# Patient Record
Sex: Female | Born: 1935 | Race: White | Hispanic: No | Smoking: Never smoker
Health system: Southern US, Community
[De-identification: ages and names within clinical notes are randomized; demographics above are authoritative.]

## PROBLEM LIST (undated history)

## (undated) DIAGNOSIS — I5181 Takotsubo syndrome: Secondary | ICD-10-CM

## (undated) DIAGNOSIS — N6019 Diffuse cystic mastopathy of unspecified breast: Secondary | ICD-10-CM

## (undated) DIAGNOSIS — E119 Type 2 diabetes mellitus without complications: Secondary | ICD-10-CM

## (undated) DIAGNOSIS — I10 Essential (primary) hypertension: Secondary | ICD-10-CM

## (undated) DIAGNOSIS — R000 Tachycardia, unspecified: Secondary | ICD-10-CM

## (undated) DIAGNOSIS — I442 Atrioventricular block, complete: Secondary | ICD-10-CM

## (undated) DIAGNOSIS — I480 Paroxysmal atrial fibrillation: Secondary | ICD-10-CM

## (undated) DIAGNOSIS — E785 Hyperlipidemia, unspecified: Secondary | ICD-10-CM

## (undated) DIAGNOSIS — I639 Cerebral infarction, unspecified: Secondary | ICD-10-CM

## (undated) DIAGNOSIS — Z7901 Long term (current) use of anticoagulants: Secondary | ICD-10-CM

## (undated) DIAGNOSIS — I214 Non-ST elevation (NSTEMI) myocardial infarction: Secondary | ICD-10-CM

## (undated) DIAGNOSIS — C50912 Malignant neoplasm of unspecified site of left female breast: Secondary | ICD-10-CM

## (undated) DIAGNOSIS — I5032 Chronic diastolic (congestive) heart failure: Secondary | ICD-10-CM

## (undated) DIAGNOSIS — I441 Atrioventricular block, second degree: Secondary | ICD-10-CM

## (undated) DIAGNOSIS — I5189 Other ill-defined heart diseases: Secondary | ICD-10-CM

## (undated) DIAGNOSIS — C50919 Malignant neoplasm of unspecified site of unspecified female breast: Secondary | ICD-10-CM

## (undated) DIAGNOSIS — I2510 Atherosclerotic heart disease of native coronary artery without angina pectoris: Secondary | ICD-10-CM

## (undated) DIAGNOSIS — H3530 Unspecified macular degeneration: Secondary | ICD-10-CM

## (undated) DIAGNOSIS — K635 Polyp of colon: Secondary | ICD-10-CM

## (undated) DIAGNOSIS — I4891 Unspecified atrial fibrillation: Secondary | ICD-10-CM

## (undated) DIAGNOSIS — Z950 Presence of cardiac pacemaker: Secondary | ICD-10-CM

## (undated) DIAGNOSIS — I471 Supraventricular tachycardia, unspecified: Secondary | ICD-10-CM

## (undated) HISTORY — DX: Essential (primary) hypertension: I10

## (undated) HISTORY — DX: Tachycardia, unspecified: R00.0

## (undated) HISTORY — DX: Hyperlipidemia, unspecified: E78.5

## (undated) HISTORY — DX: Polyp of colon: K63.5

## (undated) HISTORY — DX: Unspecified macular degeneration: H35.30

## (undated) HISTORY — PX: OTHER SURGICAL HISTORY: SHX169

## (undated) HISTORY — PX: APPENDECTOMY: SHX54

## (undated) HISTORY — PX: ABDOMINAL HYSTERECTOMY: SHX81

## (undated) HISTORY — DX: Diffuse cystic mastopathy of unspecified breast: N60.19

---

## 2004-09-22 ENCOUNTER — Ambulatory Visit: Payer: Self-pay | Admitting: Internal Medicine

## 2004-10-04 HISTORY — PX: BREAST BIOPSY: SHX20

## 2004-10-21 ENCOUNTER — Ambulatory Visit: Payer: Self-pay | Admitting: Internal Medicine

## 2005-05-24 ENCOUNTER — Ambulatory Visit: Payer: Self-pay | Admitting: General Surgery

## 2005-12-20 ENCOUNTER — Ambulatory Visit: Payer: Self-pay | Admitting: General Surgery

## 2007-01-12 ENCOUNTER — Ambulatory Visit: Payer: Self-pay | Admitting: General Surgery

## 2008-01-15 ENCOUNTER — Ambulatory Visit: Payer: Self-pay | Admitting: General Surgery

## 2008-02-08 ENCOUNTER — Ambulatory Visit: Payer: Self-pay | Admitting: General Surgery

## 2009-01-17 ENCOUNTER — Ambulatory Visit: Payer: Self-pay | Admitting: General Surgery

## 2009-05-08 ENCOUNTER — Ambulatory Visit: Payer: Self-pay | Admitting: Ophthalmology

## 2009-05-14 ENCOUNTER — Ambulatory Visit: Payer: Self-pay | Admitting: Ophthalmology

## 2010-01-26 ENCOUNTER — Ambulatory Visit: Payer: Self-pay | Admitting: General Surgery

## 2011-01-28 ENCOUNTER — Ambulatory Visit: Payer: Self-pay | Admitting: General Surgery

## 2012-03-07 ENCOUNTER — Ambulatory Visit: Payer: Self-pay | Admitting: General Surgery

## 2012-10-04 HISTORY — PX: EYE SURGERY: SHX253

## 2012-10-05 ENCOUNTER — Ambulatory Visit: Payer: Self-pay | Admitting: Ophthalmology

## 2012-10-05 DIAGNOSIS — I10 Essential (primary) hypertension: Secondary | ICD-10-CM

## 2012-10-10 ENCOUNTER — Ambulatory Visit: Payer: Self-pay | Admitting: Ophthalmology

## 2013-03-08 ENCOUNTER — Ambulatory Visit: Payer: Self-pay | Admitting: General Surgery

## 2013-03-13 ENCOUNTER — Encounter: Payer: Self-pay | Admitting: General Surgery

## 2013-03-14 ENCOUNTER — Encounter: Payer: Self-pay | Admitting: General Surgery

## 2013-03-22 ENCOUNTER — Encounter: Payer: Self-pay | Admitting: General Surgery

## 2013-03-22 ENCOUNTER — Ambulatory Visit (INDEPENDENT_AMBULATORY_CARE_PROVIDER_SITE_OTHER): Payer: Medicare Other | Admitting: General Surgery

## 2013-03-22 ENCOUNTER — Other Ambulatory Visit: Payer: Self-pay

## 2013-03-22 VITALS — BP 124/78 | HR 88 | Resp 12 | Ht 66.0 in | Wt 182.6 lb

## 2013-03-22 DIAGNOSIS — N6019 Diffuse cystic mastopathy of unspecified breast: Secondary | ICD-10-CM

## 2013-03-22 DIAGNOSIS — Z860100 Personal history of colon polyps, unspecified: Secondary | ICD-10-CM | POA: Insufficient documentation

## 2013-03-22 DIAGNOSIS — N6452 Nipple discharge: Secondary | ICD-10-CM

## 2013-03-22 DIAGNOSIS — N6459 Other signs and symptoms in breast: Secondary | ICD-10-CM

## 2013-03-22 HISTORY — DX: Nipple discharge: N64.52

## 2013-03-22 MED ORDER — POLYETHYLENE GLYCOL 3350 17 GM/SCOOP PO POWD: ORAL | Status: DC

## 2013-03-22 NOTE — Progress Notes (Addendum)
Patient ID: Susan [name], female   DOB: 11/15/1931, 79 y.o.   MRN: [id number]  Chief Complaint  Patient presents with  . Follow-up    mammogram    HPI [name] is a 79 y.o. female who presents for a breast evaluation. She has a history of FCD.The most recent mammogram was done on 02/22/13 and 03/08/13 with a birad category 0. Additional views of the left breast were done on 03/05/13 with a birad category 2. Patient does perform regular self breast checks and gets regular mammograms done. The patient has had a right breast biopsy done in 2006. No family history of breast cancer. The patient states she has had 1 episode of bloody discharge in the left breast 1 mo ago-none since. She also states pain in the left breast that comes and goes. She describes the pain as mild and short. Patient is due for colonoscopy -last in 2009 with polyp removal.    HPI  Past Medical History  Diagnosis Date  . Hypertension   . Diffuse cystic mastopathy   . Colon polyp     Past Surgical History  Procedure Laterality Date  . Colonoscopy    . Breast biopsy Right 2006  . Salpingo oophorectmy    . Appendectomy    . Abdominal hysterectomy    . Eye surgery Bilateral 2014    cataract    History reviewed. No pertinent family history.  Social History History  Substance Use Topics  . Smoking status: Never Smoker   . Smokeless tobacco: Never Used  . Alcohol Use: No    No Known Allergies  Current Outpatient Prescriptions  Medication Sig Dispense Refill  . amLODipine-benazepril (LOTREL) 5-20 MG per capsule Take 1 capsule by mouth daily.      Marland Kitchen atorvastatin (LIPITOR) 20 MG tablet Take 20 mg by mouth daily.      . Biotin 5000 MCG CAPS Take 1 capsule by mouth daily.      . hydrochlorothiazide (HYDRODIURIL) 25 MG tablet Take 25 mg by mouth daily.      . Multiple Vitamins-Minerals (PRESERVISION AREDS 2) CAPS Take 1 capsule by mouth daily.      . polyethylene glycol powder (GLYCOLAX/MIRALAX) powder  255 grams one bottle for colonoscopy prep  255 g  0   No current facility-administered medications for this visit.    Review of Systems Review of Systems  Constitutional: Negative.   Respiratory: Negative.   Cardiovascular: Negative.     Blood pressure 124/78, pulse 88, resp. rate 12, height 5\' 6"  (1.676 m), weight 182 lb 9.6 oz (82.827 kg).  Physical Exam Physical Exam  Constitutional: She is oriented to person, place, and time. She appears well-developed and well-nourished.  Eyes: Conjunctivae are normal. No scleral icterus.  Neck: Trachea normal. No mass and no thyromegaly present.  Cardiovascular: Normal rate, regular rhythm and normal heart sounds.   Pulmonary/Chest: Effort normal and breath sounds normal. Right breast exhibits no inverted nipple, no mass, no nipple discharge, no skin change and no tenderness. Left breast exhibits no inverted nipple, no mass, no nipple discharge, no skin change and no tenderness. Breasts are symmetrical.  Abdominal: Soft. Normal appearance and bowel sounds are normal. There is no hepatosplenomegaly. There is no tenderness. No hernia.  Lymphadenopathy:    She has no cervical adenopathy.    She has no axillary adenopathy.  Neurological: She is alert and oriented to person, place, and time.  Skin: Skin is warm and dry.    Data  Reviewed  Mammogram reviewed and stable.   Assessment   Ultrasound of left breast to be performed today due to left nipple discharge. Completed today with no findings.     Plan    Patient to have colonoscopy scheduled. Follow up in 1 year with bilateral screening mammogram.     Patient has been scheduled for a colonoscopy on 04-04-13 at [facility].   Mame Twombly G 03/22/2013, 7:25 PM

## 2013-03-22 NOTE — Patient Instructions (Addendum)
The patient will be scheduled for a colonoscopy. Patient to follow up in 1 year with bilateral screening mammogram.   Colonoscopy A colonoscopy is an exam to evaluate your entire colon. In this exam, your colon is cleansed. A long fiberoptic tube is inserted through your rectum and into your colon. The fiberoptic scope (endoscope) is a long bundle of enclosed and very flexible fibers. These fibers transmit light to the area examined and send images from that area to your caregiver. Discomfort is usually minimal. You may be given a drug to help you sleep (sedative) during or prior to the procedure. This exam helps to detect lumps (tumors), polyps, inflammation, and areas of bleeding. Your caregiver may also take a small piece of tissue (biopsy) that will be examined under a microscope. LET YOUR CAREGIVER KNOW ABOUT:   Allergies to food or medicine.  Medicines taken, including vitamins, herbs, eyedrops, over-the-counter medicines, and creams.  Use of steroids (by mouth or creams).  Previous problems with anesthetics or numbing medicines.  History of bleeding problems or blood clots.  Previous surgery.  Other health problems, including diabetes and kidney problems.  Possibility of pregnancy, if this applies. BEFORE THE PROCEDURE   A clear liquid diet may be required for 2 days before the exam.  Ask your caregiver about changing or stopping your regular medications.  Liquid injections (enemas) or laxatives may be required.  A large amount of electrolyte solution may be given to you to drink over a short period of time. This solution is used to clean out your colon.  You should be present 60 minutes prior to your procedure or as directed by your caregiver. AFTER THE PROCEDURE   If you received a sedative or pain relieving medication, you will need to arrange for someone to drive you home.  Occasionally, there is a little blood passed with the first bowel movement. Do not be  concerned. FINDING OUT THE RESULTS OF YOUR TEST Not all test results are available during your visit. If your test results are not back during the visit, make an appointment with your caregiver to find out the results. Do not assume everything is normal if you have not heard from your caregiver or the medical facility. It is important for you to follow up on all of your test results. HOME CARE INSTRUCTIONS   It is not unusual to pass moderate amounts of gas and experience mild abdominal cramping following the procedure. This is due to air being used to inflate your colon during the exam. Walking or a warm pack on your belly (abdomen) may help.  You may resume all normal meals and activities after sedatives and medicines have worn off.  Only take over-the-counter or prescription medicines for pain, discomfort, or fever as directed by your caregiver. Do not use aspirin or blood thinners if a biopsy was taken. Consult your caregiver for medicine usage if biopsies were taken. SEEK IMMEDIATE MEDICAL CARE IF:   You have a fever.  You pass large blood clots or fill a toilet with blood following the procedure. This may also occur 10 to 14 days following the procedure. This is more likely if a biopsy was taken.  You develop abdominal pain that keeps getting worse and cannot be relieved with medicine. Document Released: 09/17/2000 Document Revised: 12/13/2011 Document Reviewed: 05/02/2008 [facility] Patient Information 2014 [place], [place].  Patient has been scheduled for a colonoscopy on 04-04-13 at [facility].  Advised if she notes any more bloody discharge from nipple to  call for reevalaution

## 2013-03-26 ENCOUNTER — Other Ambulatory Visit: Payer: Self-pay | Admitting: General Surgery

## 2013-03-26 ENCOUNTER — Telehealth: Payer: Self-pay | Admitting: *Deleted

## 2013-03-26 DIAGNOSIS — Z86010 Personal history of colonic polyps: Secondary | ICD-10-CM

## 2013-03-26 NOTE — Telephone Encounter (Signed)
Patient contacted the office wanting to reschedule colonoscopy. This has been moved from 04-04-13 to 05-02-13 at [facility]. Trish in endoscopy aware of date change. Patient to call if she has further questions.

## 2013-04-09 ENCOUNTER — Ambulatory Visit: Payer: Self-pay | Admitting: General Surgery

## 2013-05-02 ENCOUNTER — Ambulatory Visit: Payer: Self-pay | Admitting: General Surgery

## 2013-05-02 DIAGNOSIS — Z86010 Personal history of colonic polyps: Secondary | ICD-10-CM

## 2013-05-02 DIAGNOSIS — K5730 Diverticulosis of large intestine without perforation or abscess without bleeding: Secondary | ICD-10-CM

## 2013-05-04 ENCOUNTER — Encounter: Payer: Self-pay | Admitting: General Surgery

## 2014-03-10 ENCOUNTER — Observation Stay: Payer: Self-pay | Admitting: Internal Medicine

## 2014-03-10 DIAGNOSIS — I517 Cardiomegaly: Secondary | ICD-10-CM

## 2014-03-10 DIAGNOSIS — I472 Ventricular tachycardia, unspecified: Secondary | ICD-10-CM | POA: Insufficient documentation

## 2014-03-10 LAB — URINALYSIS, COMPLETE
BILIRUBIN, UR: NEGATIVE
BLOOD: NEGATIVE
GLUCOSE, UR: NEGATIVE mg/dL (ref 0–75)
KETONE: NEGATIVE
Leukocyte Esterase: NEGATIVE
NITRITE: NEGATIVE
PROTEIN: NEGATIVE
Ph: 6 (ref 4.5–8.0)
RBC,UR: 1 /HPF (ref 0–5)
Specific Gravity: 1.006 (ref 1.003–1.030)

## 2014-03-10 LAB — CBC
HCT: 44.8 % (ref 35.0–47.0)
HGB: 14.6 g/dL (ref 12.0–16.0)
MCH: 30.5 pg (ref 26.0–34.0)
MCHC: 32.7 g/dL (ref 32.0–36.0)
MCV: 93 fL (ref 80–100)
PLATELETS: 251 10*3/uL (ref 150–440)
RBC: 4.8 10*6/uL (ref 3.80–5.20)
RDW: 13 % (ref 11.5–14.5)
WBC: 8.1 10*3/uL (ref 3.6–11.0)

## 2014-03-10 LAB — COMPREHENSIVE METABOLIC PANEL
AST: 23 U/L (ref 15–37)
Albumin: 4 g/dL (ref 3.4–5.0)
Alkaline Phosphatase: 96 U/L
Anion Gap: 7 (ref 7–16)
BUN: 13 mg/dL (ref 7–18)
Bilirubin,Total: 0.4 mg/dL (ref 0.2–1.0)
CHLORIDE: 100 mmol/L (ref 98–107)
Calcium, Total: 9 mg/dL (ref 8.5–10.1)
Co2: 27 mmol/L (ref 21–32)
Creatinine: 0.61 mg/dL (ref 0.60–1.30)
EGFR (African American): >60
EGFR (Non-African Amer.): >60
Glucose: 142 mg/dL — ABNORMAL HIGH (ref 65–99)
Osmolality: 271 (ref 275–301)
POTASSIUM: 3.3 mmol/L — AB (ref 3.5–5.1)
SGPT (ALT): 35 U/L (ref 12–78)
SODIUM: 134 mmol/L — AB (ref 136–145)
TOTAL PROTEIN: 7.3 g/dL (ref 6.4–8.2)

## 2014-03-10 LAB — MAGNESIUM: Magnesium: 1.7 mg/dL — ABNORMAL LOW

## 2014-03-10 LAB — CK TOTAL AND CKMB (NOT AT ARMC)
CK, TOTAL: 116 U/L
CK, Total: 97 U/L
CK, Total: 91 U/L
CK-MB: 1 ng/mL (ref 0.5–3.6)
CK-MB: 1.2 ng/mL (ref 0.5–3.6)
CK-MB: 1.1 ng/mL (ref 0.5–3.6)

## 2014-03-10 LAB — HEMOGLOBIN A1C: Hemoglobin A1C: 6.9 % — ABNORMAL HIGH (ref 4.2–6.3)

## 2014-03-10 LAB — TROPONIN I
Troponin-I: <0.02 ng/mL
Troponin-I: <0.02 ng/mL
Troponin-I: <0.02 ng/mL

## 2014-03-10 LAB — TSH: Thyroid Stimulating Horm: 3.16 u[IU]/mL

## 2014-03-11 DIAGNOSIS — I10 Essential (primary) hypertension: Secondary | ICD-10-CM

## 2014-03-11 DIAGNOSIS — R000 Tachycardia, unspecified: Secondary | ICD-10-CM

## 2014-03-11 DIAGNOSIS — E785 Hyperlipidemia, unspecified: Secondary | ICD-10-CM

## 2014-03-11 LAB — BASIC METABOLIC PANEL
ANION GAP: 7 (ref 7–16)
BUN: 9 mg/dL (ref 7–18)
CALCIUM: 8.4 mg/dL — AB (ref 8.5–10.1)
CO2: 22 mmol/L (ref 21–32)
CREATININE: 0.52 mg/dL — AB (ref 0.60–1.30)
Chloride: 108 mmol/L — ABNORMAL HIGH (ref 98–107)
EGFR (African American): >60
GLUCOSE: 138 mg/dL — AB (ref 65–99)
Osmolality: 275 (ref 275–301)
POTASSIUM: 4.1 mmol/L (ref 3.5–5.1)
Sodium: 137 mmol/L (ref 136–145)

## 2014-03-11 LAB — CBC WITH DIFFERENTIAL/PLATELET
Basophil #: 0.1 10*3/uL (ref 0.0–0.1)
Basophil %: 0.7 %
Eosinophil #: 0.2 10*3/uL (ref 0.0–0.7)
Eosinophil %: 2.2 %
HCT: 41.1 % (ref 35.0–47.0)
HGB: 13.5 g/dL (ref 12.0–16.0)
Lymphocyte #: 3.8 10*3/uL — ABNORMAL HIGH (ref 1.0–3.6)
Lymphocyte %: 48.9 %
MCH: 30.7 pg (ref 26.0–34.0)
MCHC: 32.8 g/dL (ref 32.0–36.0)
MCV: 93 fL (ref 80–100)
Monocyte #: 0.6 x10 3/mm (ref 0.2–0.9)
Monocyte %: 8 %
Neutrophil #: 3.1 10*3/uL (ref 1.4–6.5)
Neutrophil %: 40.2 %
Platelet: 238 10*3/uL (ref 150–440)
RBC: 4.4 10*6/uL (ref 3.80–5.20)
RDW: 13 % (ref 11.5–14.5)
WBC: 7.7 10*3/uL (ref 3.6–11.0)

## 2014-03-11 LAB — LIPID PANEL
Cholesterol: 163 mg/dL (ref 0–200)
HDL: 44 mg/dL (ref 40–60)
Ldl Cholesterol, Calc: 52 mg/dL (ref 0–100)
TRIGLYCERIDES: 333 mg/dL — AB (ref 0–200)
VLDL Cholesterol, Calc: 67 mg/dL — ABNORMAL HIGH (ref 5–40)

## 2014-03-11 LAB — MAGNESIUM: Magnesium: 2 mg/dL

## 2014-03-14 ENCOUNTER — Encounter: Payer: Self-pay | Admitting: *Deleted

## 2014-03-15 ENCOUNTER — Ambulatory Visit (INDEPENDENT_AMBULATORY_CARE_PROVIDER_SITE_OTHER): Payer: Medicare Other | Admitting: Cardiovascular Disease

## 2014-03-15 ENCOUNTER — Encounter: Payer: Self-pay | Admitting: Cardiovascular Disease

## 2014-03-15 VITALS — BP 148/80 | HR 63 | Ht 66.0 in | Wt 183.5 lb

## 2014-03-15 DIAGNOSIS — I10 Essential (primary) hypertension: Secondary | ICD-10-CM

## 2014-03-15 DIAGNOSIS — I4891 Unspecified atrial fibrillation: Secondary | ICD-10-CM

## 2014-03-15 DIAGNOSIS — R000 Tachycardia, unspecified: Secondary | ICD-10-CM

## 2014-03-15 MED ORDER — METOPROLOL TARTRATE 25 MG PO TABS: 25.0000 mg | ORAL_TABLET | Freq: Two times a day (BID) | ORAL | Status: DC

## 2014-03-15 NOTE — Assessment & Plan Note (Signed)
Blood pressure is well controlled on current medications. 

## 2014-03-15 NOTE — Patient Instructions (Signed)
Your physician has recommended that you wear an event monitor. Event monitors are medical devices that record the heart's electrical activity. Doctors most often [place] these monitors to diagnose arrhythmias. Arrhythmias are problems with the speed or rhythm of the heartbeat. The monitor is a small, portable device. You can wear one while you do your normal daily activities. This is usually used to diagnose what is causing palpitations/syncope (passing out).  Your physician has recommended you make the following change in your medication:  Decrease Metoprolol to 25 mg twice daily    Your physician recommends that you schedule a follow-up appointment in:  2 months

## 2014-03-15 NOTE — Assessment & Plan Note (Signed)
The patient had a recent episode of tachycardia with a heart rate of 170 beats per minute. The differential diagnosis include SVT versus atrial fibrillation. Symptoms improved with metoprolol. However, she feels very tired and fatigued on the current dose of metoprolol. I decreased the dose to 25 mg twice daily. She continues to have intermittent palpitations. It is very important to exclude atrial fibrillation as that would require anticoagulation. Thus, I recommend a 30 day outpatient telemetry.

## 2014-03-15 NOTE — Progress Notes (Signed)
Primary care physician: Dr. Benita Stabile  HPI  This is an 80-year-old female who is here today for a followup visit after recent hospitalization at [facility]. She has no previous cardiac history. She has known history of hypertension. She presented on June 7 with palpitations, dizziness and presyncope. She was noted by EMS to be tachycardic with a heart rate of 170 beats per minute with underlying right bundle branch block. Rhythm strips were not recorded. It could not be determined if she had SVT or atrial fibrillation as she converted quickly to normal sinus rhythm. Echocardiogram showed normal LV systolic function with no significant valvular abnormalities. Since hospital discharge, she has been feeling tired and fatigue. She was discharged home on metoprolol 50 mg twice daily. She feels dizzy today. She denies any chest discomfort. She continues to have intermittent palpitations but not on a daily basis. Labs were overall unremarkable while hospitalized.  Allergies  Allergen Reactions  . No Known Allergies      Current Outpatient Prescriptions on File Prior to Visit  Medication Sig Dispense Refill  . amLODipine-benazepril (LOTREL) 5-20 MG per capsule Take 1 capsule by mouth daily.      Marland Kitchen aspirin 325 MG EC tablet Take 325 mg by mouth daily.      Marland Kitchen atorvastatin (LIPITOR) 20 MG tablet Take 20 mg by mouth daily.      . Biotin 5000 MCG CAPS Take 1 capsule by mouth daily.      . cycloSPORINE (RESTASIS) 0.05 % ophthalmic emulsion 1 drop daily.      . Multiple Vitamins-Minerals (PRESERVISION AREDS 2) CAPS Take 1 capsule by mouth daily.      . polyethylene glycol powder (GLYCOLAX/MIRALAX) powder 255 grams one bottle for colonoscopy prep  255 g  0   No current facility-administered medications on file prior to visit.     Past Medical History  Diagnosis Date  . Hypertension   . Diffuse cystic mastopathy   . Colon polyp   . Hyperlipidemia   . Macular degeneration   . Tachycardia      Past  Surgical History  Procedure Laterality Date  . Colonoscopy    . Breast biopsy Right 2006  . Salpingo oophorectmy    . Appendectomy    . Abdominal hysterectomy    . Eye surgery Bilateral 2014    cataract     Family History  Problem Relation Age of Onset  . Family history unknown: Yes     History   Social History  . Marital Status: Single    Spouse Name: N/A    Number of Children: N/A  . Years of Education: N/A   Occupational History  . Not on file.   Social History Main Topics  . Smoking status: Never Smoker   . Smokeless tobacco: Never Used  . Alcohol Use: No  . Drug Use: No  . Sexual Activity: Not on file   Other Topics Concern  . Not on file   Social History Narrative  . No narrative on file     ROS A 10 point review of system was performed. It is negative other than that mentioned in the history of present illness.   PHYSICAL EXAM   BP 148/80  Pulse 63  Ht 5\' 6"  (1.676 m)  Wt 183 lb 8 oz (83.235 kg)  BMI 29.63 kg/m2 Constitutional: She is oriented to person, place, and time. She appears well-developed and well-nourished. No distress.  HENT: No nasal discharge.  Head: Normocephalic and atraumatic.  Eyes: Pupils are equal and round. No discharge.  Neck: Normal range of motion. Neck supple. No JVD present. No thyromegaly present.  Cardiovascular: Normal rate, regular rhythm, normal heart sounds. Exam reveals no gallop and no friction rub. No murmur heard.  Pulmonary/Chest: Effort normal and breath sounds normal. No stridor. No respiratory distress. She has no wheezes. She has no rales. She exhibits no tenderness.  Abdominal: Soft. Bowel sounds are normal. She exhibits no distension. There is no tenderness. There is no rebound and no guarding.  Musculoskeletal: Normal range of motion. She exhibits no edema and no tenderness.  Neurological: She is alert and oriented to person, place, and time. Coordination normal.  Skin: Skin is warm and dry. No rash  noted. She is not diaphoretic. No erythema. No pallor.  Psychiatric: She has a normal mood and affect. Her behavior is normal. Judgment and thought content normal.     QMG:QQPYP  Rhythm  -Right bundle branch block.   ABNORMAL    ASSESSMENT AND PLAN

## 2014-03-21 DIAGNOSIS — R000 Tachycardia, unspecified: Secondary | ICD-10-CM

## 2014-03-27 ENCOUNTER — Telehealth: Payer: Self-pay | Admitting: *Deleted

## 2014-03-27 NOTE — Telephone Encounter (Signed)
Please call patient as she isn't sure how long to wear her heart monitor.

## 2014-03-27 NOTE — Telephone Encounter (Signed)
Spoke w/ pt.  Advised her that she is to wear her event monitor for 30 days.  She states that it is quite confining, but she will continue to wear it as long as she can stand it.  Pt to call if she feels she cannot wear for the complete 30 days.

## 2014-04-03 ENCOUNTER — Ambulatory Visit: Payer: Medicare Other | Admitting: General Surgery

## 2014-04-16 ENCOUNTER — Telehealth: Payer: Self-pay | Admitting: *Deleted

## 2014-04-16 NOTE — Telephone Encounter (Signed)
Patient talked to [facility] and sent monitor back today because she was having issues with it  She stated her 30 days were almost complete  Patient stated to call her back at [phone] if we have any questions

## 2014-04-16 NOTE — Telephone Encounter (Signed)
Patient was having problem with the heart monitor. Please call

## 2014-04-23 ENCOUNTER — Ambulatory Visit: Payer: Self-pay | Admitting: General Surgery

## 2014-04-24 ENCOUNTER — Encounter: Payer: Self-pay | Admitting: General Surgery

## 2014-05-01 ENCOUNTER — Ambulatory Visit (INDEPENDENT_AMBULATORY_CARE_PROVIDER_SITE_OTHER): Payer: Medicare Other | Admitting: General Surgery

## 2014-05-01 ENCOUNTER — Encounter: Payer: Self-pay | Admitting: General Surgery

## 2014-05-01 VITALS — BP 110/70 | HR 68 | Resp 12 | Ht 66.0 in | Wt 185.0 lb

## 2014-05-01 DIAGNOSIS — N6019 Diffuse cystic mastopathy of unspecified breast: Secondary | ICD-10-CM

## 2014-05-01 DIAGNOSIS — Z86010 Personal history of colonic polyps: Secondary | ICD-10-CM

## 2014-05-01 NOTE — Progress Notes (Signed)
Patient ID: Susan [name], female   DOB: 12/14/1931, 80 y.o.   MRN: [id number]  Chief Complaint  Patient presents with  . Follow-up    mammogram    HPI Susan [name] is a 80 y.o. female who presents for a breast evaluation. The most recent mammogram was done on 04/23/14.Patient does perform regular self breast checks and gets regular mammograms done.  Recently had an episode of palpitations with HR of 170, responded quickly to Metoprolol. Had a monitor for 30 days, result pending.  HPI  Past Medical History  Diagnosis Date  . Diffuse cystic mastopathy   . Colon polyp   . Hyperlipidemia   . Macular degeneration   . Tachycardia   . Hypertension     Past Surgical History  Procedure Laterality Date  . Colonoscopy    . Breast biopsy Right 2006  . Salpingo oophorectmy    . Appendectomy    . Abdominal hysterectomy    . Eye surgery Bilateral 2014    cataract    History reviewed. No pertinent family history.  Social History History  Substance Use Topics  . Smoking status: Never Smoker   . Smokeless tobacco: Never Used  . Alcohol Use: No    Allergies  Allergen Reactions  . No Known Allergies     Current Outpatient Prescriptions  Medication Sig Dispense Refill  . amLODipine-benazepril (LOTREL) 5-20 MG per capsule Take 1 capsule by mouth daily.      Marland Kitchen aspirin 325 MG EC tablet Take 325 mg by mouth daily.      Marland Kitchen atorvastatin (LIPITOR) 20 MG tablet Take 20 mg by mouth daily.      . Biotin 5000 MCG CAPS Take 1 capsule by mouth daily.      . cycloSPORINE (RESTASIS) 0.05 % ophthalmic emulsion 1 drop daily.      . metoprolol tartrate (LOPRESSOR) 25 MG tablet Take 1 tablet (25 mg total) by mouth 2 (two) times daily.  180 tablet  3  . Multiple Vitamins-Minerals (PRESERVISION AREDS 2) CAPS Take 1 capsule by mouth daily.       No current facility-administered medications for this visit.    Review of Systems Review of Systems  Constitutional: Negative.   Respiratory:  Negative.   Cardiovascular: Negative.     Blood pressure 110/70, pulse 68, resp. rate 12, height 5\' 6"  (1.676 m), weight 185 lb (83.915 kg).  Physical Exam Physical Exam  Constitutional: She is oriented to person, place, and time. She appears well-developed and well-nourished.  Eyes: Conjunctivae are normal. No scleral icterus.  Neck: Neck supple.  Cardiovascular: Normal rate, regular rhythm and normal heart sounds.   Pulses:      Dorsalis pedis pulses are 2+ on the right side, and 2+ on the left side.       Posterior tibial pulses are 2+ on the right side, and 2+ on the left side.  Left ankle edema   Pulmonary/Chest: Effort normal and breath sounds normal. Right breast exhibits no inverted nipple, no mass, no nipple discharge, no skin change and no tenderness. Left breast exhibits no inverted nipple, no mass, no nipple discharge, no skin change and no tenderness.  Abdominal: Soft. Bowel sounds are normal. There is no tenderness.  Lymphadenopathy:    She has no cervical adenopathy.    She has no axillary adenopathy.  Neurological: She is alert and oriented to person, place, and time.  Skin: Skin is warm and dry.    Data Reviewed Mammogram reviewed  Assessment    Stable exam, history of fibrocyst diease. Heart tachycardia, being followed by cardiology. Mild left ankle edema may be due to mild stasis. Colonoscopy last yr -no polyps.    Plan    Patient will be asked to return to the office in one year with a bilateral screening mammogram. Recommend use of mild compression hose for left leg. If edema worsens will reevaluate.       Jt Brabec G 05/01/2014, 7:24 PM

## 2014-05-01 NOTE — Patient Instructions (Signed)
Patient will be asked to return to the office in one year for a bilateral screening mammogram.Continue self breast exams. Call office for any new breast issues or concerns.

## 2014-05-02 ENCOUNTER — Telehealth: Payer: Self-pay | Admitting: *Deleted

## 2014-05-02 NOTE — Telephone Encounter (Signed)
Informed patient that per Dr Fletcher Anon her holter monitor showed: Normal sinus rhythm with no significant arrythmia  Patient verbalized understanding

## 2014-05-03 ENCOUNTER — Ambulatory Visit (INDEPENDENT_AMBULATORY_CARE_PROVIDER_SITE_OTHER): Payer: Medicare Other

## 2014-05-03 ENCOUNTER — Other Ambulatory Visit: Payer: Self-pay

## 2014-05-03 DIAGNOSIS — R000 Tachycardia, unspecified: Secondary | ICD-10-CM

## 2014-05-09 ENCOUNTER — Encounter: Payer: Self-pay | Admitting: Cardiovascular Disease

## 2014-05-09 ENCOUNTER — Ambulatory Visit (INDEPENDENT_AMBULATORY_CARE_PROVIDER_SITE_OTHER): Payer: Medicare Other | Admitting: Cardiovascular Disease

## 2014-05-09 VITALS — BP 104/53 | HR 78 | Ht 66.0 in | Wt 186.0 lb

## 2014-05-09 DIAGNOSIS — I10 Essential (primary) hypertension: Secondary | ICD-10-CM

## 2014-05-09 DIAGNOSIS — R000 Tachycardia, unspecified: Secondary | ICD-10-CM

## 2014-05-09 MED ORDER — LOSARTAN POTASSIUM-HCTZ 50-12.5 MG PO TABS
1.0000 | ORAL_TABLET | Freq: Every day | ORAL | Status: DC
Start: 2014-05-09 — End: 2015-04-25

## 2014-05-09 NOTE — Assessment & Plan Note (Signed)
Likely paroxysmal supraventricular tachycardia. She has not had any episodes since she was placed on metoprolol. Continue current management.

## 2014-05-09 NOTE — Assessment & Plan Note (Signed)
Blood pressure is well controlled. However, she is complaining of increased lower extremity edema. Thus, I switched amlodipine-benazepril to losartan/hydrochlorothiazide.

## 2014-05-09 NOTE — Patient Instructions (Addendum)
Your physician has recommended you make the following change in your medication:  Stop Amlodipine- Benazepril Start Losartan - HCTZ 50/12.5 once daily   Your physician wants you to follow-up in: 6 months. You will receive a reminder letter in the mail two months in advance. If you don't receive a letter, please call our office to schedule the follow-up appointment.

## 2014-05-09 NOTE — Progress Notes (Signed)
Primary care physician: Dr. Benita Stabile  HPI  This is an 80 year old female who is here today for a followup visit regarding suspected supraventricular tachycardia.She has known history of hypertension. She presented on June 7 with palpitations, dizziness and presyncope. She was noted by EMS to be tachycardic with a heart rate of 170 beats per minute with underlying right bundle branch block. Rhythm strips were not recorded. It could not be determined if she had SVT or atrial fibrillation as she converted quickly to normal sinus rhythm. Echocardiogram showed normal LV systolic function with no significant valvular abnormalities. Since hospital discharge, she has been feeling tired and fatigue. She was discharged home on metoprolol 50 mg twice daily which was subsequently decreased to 25 mg once daily. She underwent a 30 day outpatient telemetry which showed no evidence of arrhythmia. Specifically, there was no evidence of atrial fibrillation. She has been doing well. She complains of increased lower extremity edema. She used to be on a diuretic in the past.   Allergies  Allergen Reactions  . No Known Allergies      Current Outpatient Prescriptions on File Prior to Visit  Medication Sig Dispense Refill  . amLODipine-benazepril (LOTREL) 5-20 MG per capsule Take 1 capsule by mouth daily.      Marland Kitchen atorvastatin (LIPITOR) 20 MG tablet Take 20 mg by mouth daily.      . Biotin 5000 MCG CAPS Take 1 capsule by mouth daily.      . cycloSPORINE (RESTASIS) 0.05 % ophthalmic emulsion 1 drop daily.      . metoprolol tartrate (LOPRESSOR) 25 MG tablet Take 1 tablet (25 mg total) by mouth 2 (two) times daily.  180 tablet  3  . Multiple Vitamins-Minerals (PRESERVISION AREDS 2) CAPS Take 1 capsule by mouth daily.       No current facility-administered medications on file prior to visit.     Past Medical History  Diagnosis Date  . Diffuse cystic mastopathy   . Colon polyp   . Hyperlipidemia   . Macular  degeneration   . Tachycardia   . Hypertension      Past Surgical History  Procedure Laterality Date  . Colonoscopy    . Breast biopsy Right 2006  . Salpingo oophorectmy    . Appendectomy    . Abdominal hysterectomy    . Eye surgery Bilateral 2014    cataract     History reviewed. No pertinent family history.   History   Social History  . Marital Status: Divorced    Spouse Name: N/A    Number of Children: N/A  . Years of Education: N/A   Occupational History  . Not on file.   Social History Main Topics  . Smoking status: Never Smoker   . Smokeless tobacco: Never Used  . Alcohol Use: No  . Drug Use: No  . Sexual Activity: Not on file   Other Topics Concern  . Not on file   Social History Narrative  . No narrative on file     ROS A 10 point review of system was performed. It is negative other than that mentioned in the history of present illness.   PHYSICAL EXAM   BP 104/53  Pulse 78  Ht 5\' 6"  (1.676 m)  Wt 186 lb (84.369 kg)  BMI 30.04 kg/m2 Constitutional: She is oriented to person, place, and time. She appears well-developed and well-nourished. No distress.  HENT: No nasal discharge.  Head: Normocephalic and atraumatic.  Eyes: Pupils are equal  and round. No discharge.  Neck: Normal range of motion. Neck supple. No JVD present. No thyromegaly present.  Cardiovascular: Normal rate, regular rhythm, normal heart sounds. Exam reveals no gallop and no friction rub. No murmur heard.  Pulmonary/Chest: Effort normal and breath sounds normal. No stridor. No respiratory distress. She has no wheezes. She has no rales. She exhibits no tenderness.  Abdominal: Soft. Bowel sounds are normal. She exhibits no distension. There is no tenderness. There is no rebound and no guarding.  Musculoskeletal: Normal range of motion. She exhibits no edema and no tenderness.  Neurological: She is alert and oriented to person, place, and time. Coordination normal.  Skin: Skin is  warm and dry. No rash noted. She is not diaphoretic. No erythema. No pallor.  Psychiatric: She has a normal mood and affect. Her behavior is normal. Judgment and thought content normal.       ASSESSMENT AND PLAN

## 2014-05-16 ENCOUNTER — Ambulatory Visit: Payer: Medicare Other | Admitting: Cardiovascular Disease

## 2014-08-05 ENCOUNTER — Encounter: Payer: Self-pay | Admitting: Cardiovascular Disease

## 2014-11-05 ENCOUNTER — Ambulatory Visit: Payer: Medicare Other | Admitting: Cardiovascular Disease

## 2014-11-07 ENCOUNTER — Encounter: Payer: Self-pay | Admitting: Cardiovascular Disease

## 2014-11-07 ENCOUNTER — Ambulatory Visit (INDEPENDENT_AMBULATORY_CARE_PROVIDER_SITE_OTHER): Payer: Medicare Other | Admitting: Cardiovascular Disease

## 2014-11-07 VITALS — BP 134/84 | HR 72 | Ht 66.0 in | Wt 188.5 lb

## 2014-11-07 DIAGNOSIS — I10 Essential (primary) hypertension: Secondary | ICD-10-CM

## 2014-11-07 DIAGNOSIS — R000 Tachycardia, unspecified: Secondary | ICD-10-CM

## 2014-11-07 DIAGNOSIS — I471 Supraventricular tachycardia: Secondary | ICD-10-CM

## 2014-11-07 NOTE — Progress Notes (Signed)
Primary care physician: Dr. Benita Stabile  HPI  This is an 81 year old female who is here today for a followup visit regarding suspected supraventricular tachycardia.She has known history of hypertension. She presented in June, 2015 with palpitations, dizziness and presyncope. She was noted by EMS to be tachycardic with a heart rate of 170 beats per minute with underlying right bundle branch block. Rhythm strips were not recorded. It could not be determined if she had SVT or atrial fibrillation as she converted quickly to normal sinus rhythm. Echocardiogram showed normal LV systolic function with no significant valvular abnormalities. Since hospital discharge, she has been feeling tired and fatigue. She was discharged home on metoprolol 50 mg twice daily which was subsequently decreased to 25 mg once daily. She underwent a 30 day outpatient telemetry which showed no evidence of arrhythmia. Specifically, there was no evidence of atrial fibrillation. During last visit, amlodipine was switched to hydrochlorothiazide due to lower extremity edema. She reports no palpitations, chest pain or leg edema. She is complaining of double vision and currently getting workup done by ophthalmology and soon to be seen by neurology.   Allergies  Allergen Reactions  . No Known Allergies      Current Outpatient Prescriptions on File Prior to Visit  Medication Sig Dispense Refill  . atorvastatin (LIPITOR) 20 MG tablet Take 20 mg by mouth daily.    . Biotin 5000 MCG CAPS Take 1 capsule by mouth daily.    . cycloSPORINE (RESTASIS) 0.05 % ophthalmic emulsion 1 drop daily.    Marland Kitchen losartan-hydrochlorothiazide (HYZAAR) 50-12.5 MG per tablet Take 1 tablet by mouth daily. 90 tablet 3  . metoprolol tartrate (LOPRESSOR) 25 MG tablet Take 1 tablet (25 mg total) by mouth 2 (two) times daily. 180 tablet 3  . Multiple Vitamins-Minerals (PRESERVISION AREDS 2) CAPS Take 1 capsule by mouth daily.     No current facility-administered  medications on file prior to visit.     Past Medical History  Diagnosis Date  . Diffuse cystic mastopathy   . Colon polyp   . Hyperlipidemia   . Macular degeneration   . Tachycardia   . Hypertension      Past Surgical History  Procedure Laterality Date  . Colonoscopy    . Breast biopsy Right 2006  . Salpingo oophorectmy    . Appendectomy    . Abdominal hysterectomy    . Eye surgery Bilateral 2014    cataract     Family History  Problem Relation Age of Onset  . Family history unknown: Yes     History   Social History  . Marital Status: Divorced    Spouse Name: N/A    Number of Children: N/A  . Years of Education: N/A   Occupational History  . Not on file.   Social History Main Topics  . Smoking status: Never Smoker   . Smokeless tobacco: Never Used  . Alcohol Use: No  . Drug Use: No  . Sexual Activity: Not on file   Other Topics Concern  . Not on file   Social History Narrative     ROS A 10 point review of system was performed. It is negative other than that mentioned in the history of present illness.   PHYSICAL EXAM   BP 134/84 mmHg  Pulse 72  Ht 5\' 6"  (1.676 m)  Wt 188 lb 8 oz (85.503 kg)  BMI 30.44 kg/m2 Constitutional: She is oriented to person, place, and time. She appears well-developed and well-nourished. No  distress.  HENT: No nasal discharge.  Head: Normocephalic and atraumatic.  Eyes: Pupils are equal and round. No discharge.  Neck: Normal range of motion. Neck supple. No JVD present. No thyromegaly present.  Cardiovascular: Normal rate, regular rhythm, normal heart sounds. Exam reveals no gallop and no friction rub. No murmur heard.  Pulmonary/Chest: Effort normal and breath sounds normal. No stridor. No respiratory distress. She has no wheezes. She has no rales. She exhibits no tenderness.  Abdominal: Soft. Bowel sounds are normal. She exhibits no distension. There is no tenderness. There is no rebound and no guarding.    Musculoskeletal: Normal range of motion. She exhibits no edema and no tenderness.  Neurological: She is alert and oriented to person, place, and time. Coordination normal.  Skin: Skin is warm and dry. No rash noted. She is not diaphoretic. No erythema. No pallor.  Psychiatric: She has a normal mood and affect. Her behavior is normal. Judgment and thought content normal.    QJF:HLKTG  Rhythm  -Right bundle branch block.   -  Nonspecific T-abnormality.   ABNORMAL    ASSESSMENT AND PLAN

## 2014-11-07 NOTE — Patient Instructions (Signed)
Continue same medications.   Your physician wants you to follow-up in: 1 year.  You will receive a reminder letter in the mail two months in advance. If you don't receive a letter, please call our office to schedule the follow-up appointment.  

## 2014-11-07 NOTE — Assessment & Plan Note (Signed)
No evidence of recurrent arrhythmia on small dose metoprolol 25 mg twice daily.

## 2014-11-07 NOTE — Assessment & Plan Note (Signed)
Blood pressure is well controlled on current medications. Leg edema resolved after switching amlodipine to hydrochlorothiazide.

## 2014-12-04 ENCOUNTER — Ambulatory Visit: Payer: Self-pay | Admitting: Neurology

## 2015-01-24 NOTE — Op Note (Signed)
PATIENT NAME:  Susan [name], ZUCH MR#:  [id number] DATE OF BIRTH:  09/16/1932  DATE OF PROCEDURE:  10/10/2012  PREOPERATIVE DIAGNOSIS: Visually significant cataract of the left eye.   POSTOPERATIVE DIAGNOSIS: Visually significant cataract of the left eye.   OPERATIVE PROCEDURE: Cataract extraction by phacoemulsification with implant of intraocular lens to the left eye.   SURGEON: Birder Robson, MD.   ANESTHESIA:  1. Managed anesthesia care.  2. Topical tetracaine drops followed by 2% Xylocaine jelly applied in the preoperative holding area.   COMPLICATIONS: None.   TECHNIQUE:  Stop and chop.   DESCRIPTION OF PROCEDURE: The patient was examined and consented in the preoperative holding area where the aforementioned topical anesthesia was applied to the left eye and then brought back to the operating room where the left eye was prepped and draped in the usual sterile ophthalmic fashion and a lid speculum was placed. A paracentesis was created with the side port blade and the anterior chamber was filled with viscoelastic. A near clear corneal incision was performed with the steel keratome. A continuous curvilinear capsulorrhexis was performed with a cystotome followed by the capsulorrhexis forceps. Hydrodissection and hydrodelineation were carried out with BSS on a blunt cannula. The lens was removed in a stop and chop technique and the remaining cortical material was removed with the irrigation-aspiration handpiece. The capsular bag was inflated with viscoelastic and the Alcon SN60WF 20.0-diopter lens, serial number [id number] as placed in the capsular bag without complication. The remaining viscoelastic was removed from the eye with the irrigation-aspiration handpiece. The wounds were hydrated. The anterior chamber was flushed with Miostat and the eye was inflated to physiologic pressure. 0.1 mL of cefuroxime concentration 1 mg/mL was placed in the anterior chamber. The wounds were found to be  water tight. The eye was dressed with Vigamox. The patient was given protective glasses to wear throughout the day and a shield with which to sleep tonight. The patient was also given drops with which to begin a drop regimen today and will follow up with me in 1 ay.    ____________________________ Livingston Diones. Damarcus Reggio, MD wlp:aw D: 10/10/2012 12:00:41 ET T: 10/10/2012 14:38:22 ET JOB#: [id number]  cc: Eason Housman L. Maddax Palinkas, MD, <Dictator> Livingston Diones Nguyen Butler MD ELECTRONICALLY SIGNED 10/12/2012 14:37

## 2015-01-25 NOTE — Consult Note (Signed)
General Aspect 81 y/o female w/o prior cardiac hx who presented yesterday 2/2 symptomatic tachypalpitations. ****************   Present Illness 81 y/o female without prior cardiac hx.  She does have a h/o HTN, HL, and obesity.  She was in her USOH until this past Friday, when she began to experience intermittent tachypalpitations assoc w/ lightheadedness and near syncope.  This persisted in an intermittent fashion for ~ 4.5 hrs before resolving.  She had not Ss on Saturday but yesterday morning, upon awakening, she noted tachypalps with lightheadedness.  No c/p or dyspnea.  She showered and got ready for church but as Ss persisted, she called EMS some time around 10 am.  Upon arrival, she was reportedly found to be in [facility] with rates in the 170's.  She was taken to the [facility] ED and apparently broke spontaneously just prior to arrival.  K and Mg were slightly low and were supplemented.  Labs otw unrevealing and trops nl.  Echo with nl LV fxn.  She was started on BB and has had no recurrence of tachycardia.  RBBB on ecg.  She is not aware of a prior h/o RBBB.   Physical Exam:  GEN pleasant, nad.   HEENT pink conjunctivae, moist oral mucosa   NECK supple  no bruits/jvd.   RESP normal resp effort  clear BS   CARD Regular rate and rhythm  Normal, S1, S2  No murmur   ABD denies tenderness  soft  normal BS   LYMPH negative neck   EXTR negative cyanosis/clubbing, negative edema   SKIN normal to palpation   NEURO grossly intact, nonfocal.   PSYCH alert, A+O to time, place, person   Review of Systems:  General: No Complaints   Skin: No Complaints   ENT: No Complaints   Eyes: No Complaints   Neck: No Complaints   Respiratory: No Complaints   Cardiovascular: Palpitations  presyncope in setting of palps.   Gastrointestinal: No Complaints   Genitourinary: No Complaints   Vascular: No Complaints   Musculoskeletal: No Complaints   Neurologic: No Complaints   Hematologic: No  Complaints   Endocrine: No Complaints   Psychiatric: No Complaints   Review of Systems: All other systems were reviewed and found to be negative   Medications/Allergies Reviewed Medications/Allergies reviewed   Family & Social History:  Family and Social History:  Family History father died of cancer.  mother died of AAA.  sister is borderline DM.   Social History negative ETOH, negative Illicit drugs, smoked a few cigarettes/day for ~ 10 yrs in the 4's.   Place of Living Home  Lives in [place] by herself.  Widowed.  Son lives next door.     Morbid Obesity:    hyperlipedema:    hypertension:   Lab Results:  Thyroid:  07-Jun-15 11:20   Thyroid Stimulating Hormone 3.16 (0.45-4.50 (International Unit)  ----------------------- Pregnant patients have  different reference  ranges for TSH:  - - - - - - - - - -  Pregnant, first trimetser:  0.36 - 2.50 uIU/mL)  Hepatic:  07-Jun-15 11:20   Bilirubin, Total 0.4  Alkaline Phosphatase 96 (45-117 NOTE: New Reference Range 08/24/13)  SGPT (ALT) 35  SGOT (AST) 23  Total Protein, Serum 7.3  Albumin, Serum 4.0  Routine Chem:  07-Jun-15 11:20   Hemoglobin A1c ([facility])  6.9 (The American Diabetes Association recommends that a primary goal of therapy should be <7% and that physicians should reevaluate the treatment regimen in patients with HbA1c  values consistently >8%.)  08-Jun-15 05:22   Cholesterol, Serum 163  HDL (INHOUSE) 44  VLDL Cholesterol Calculated  67  LDL Cholesterol Calculated 52 (Result(s) reported on 11 Mar 2014 at 06:01AM.)  Glucose, Serum  138  BUN 9  Creatinine (comp)  0.52  Sodium, Serum 137  Potassium, Serum 4.1  Chloride, Serum  108  CO2, Serum 22  Calcium (Total), Serum  8.4  Anion Gap 7  Osmolality (calc) 275  eGFR (African American) >60  eGFR (Non-African American) >60 (eGFR values <60m/min/1.73 m2 may be an indication of chronic kidney disease (CKD). Calculated eGFR is useful in patients  with stable renal function. The eGFR calculation will not be reliable in acutely ill patients when serum creatinine is changing rapidly. It is not useful in  patients on dialysis. The eGFR calculation may not be applicable to patients at the low and high extremes of body sizes, pregnant women, and vegetarians.)  Magnesium, Serum 2.0 (1.8-2.4 THERAPEUTIC RANGE: 4-7 mg/dL TOXIC: > 10 mg/dL  -----------------------)  Cardiac:  07-Jun-15 11:20   CK, Total 97 (26-192 NOTE: NEW REFERENCE RANGE  11/05/2013)  CPK-MB, Serum 1.0 (Result(s) reported on 10 Mar 2014 at 11:57AM.)  Troponin I < 0.02 (0.00-0.05 0.05 ng/mL or less: NEGATIVE  Repeat testing in 3-6 hrs  if clinically indicated. >0.05 ng/mL: POTENTIAL  MYOCARDIAL INJURY. Repeat  testing in 3-6 hrs if  clinically indicated. NOTE: An increase or decrease  of 30% or more on serial  testing suggests a  clinically important change)    16:01   CK, Total 116 (26-192 NOTE: NEW REFERENCE RANGE  11/05/2013)  CPK-MB, Serum 1.2 (Result(s) reported on 10 Mar 2014 at 04:29PM.)  Troponin I < 0.02 (0.00-0.05 0.05 ng/mL or less: NEGATIVE  Repeat testing in 3-6 hrs  if clinically indicated. >0.05 ng/mL: POTENTIAL  MYOCARDIAL INJURY. Repeat  testing in 3-6 hrs if  clinically indicated. NOTE: An increase or decrease  of 30% or more on serial  testing suggests a  clinically important change)    19:25   CK, Total 91 (26-192 NOTE: NEW REFERENCE RANGE  11/05/2013)  CPK-MB, Serum 1.1 (Result(s) reported on 10 Mar 2014 at 08:17PM.)  Troponin I < 0.02 (0.00-0.05 0.05 ng/mL or less: NEGATIVE  Repeat testing in 3-6 hrs  if clinically indicated. >0.05 ng/mL: POTENTIAL  MYOCARDIAL INJURY. Repeat  testing in 3-6 hrs if  clinically indicated. NOTE: An increase or decrease  of 30% or more on serial  testing suggests a  clinically important change)  Routine Hem:  08-Jun-15 05:22   WBC (CBC) 7.7  RBC (CBC) 4.40  Hemoglobin (CBC) 13.5   Hematocrit (CBC) 41.1  Platelet Count (CBC) 238  MCV 93  MCH 30.7  MCHC 32.8  RDW 13.0  Neutrophil % 40.2  Lymphocyte % 48.9  Monocyte % 8.0  Eosinophil % 2.2  Basophil % 0.7  Neutrophil # 3.1  Lymphocyte #  3.8  Monocyte # 0.6  Eosinophil # 0.2  Basophil # 0.1 (Result(s) reported on 11 Mar 2014 at 06:11AM.)   EKG:  EKG Interp. by me   Interpretation RSR, 91, RBBB.   Radiology Results: XRay:    07-Jun-15 11:39, Chest Portable Single View  Chest Portable Single View   REASON FOR EXAM:    sob  COMMENTS:       PROCEDURE: DXR - DXR PORTABLE CHEST SINGLE VIEW  - Mar 10 2014 11:39AM     CLINICAL DATA:  Short of breath.  Elevated heart rate.  Dizziness.    EXAM:  PORTABLE CHEST - 1 VIEW    COMPARISON:  None.    FINDINGS:  Midline trachea. Mild cardiomegaly with atherosclerosis in the  aorta. No pleural effusion or pneumothorax. No congestive failure.  Clear lungs. Numerous leads and wires project over the chest.     IMPRESSION:  Cardiomegaly without congestive failure.      Electronically Signed    By: Abigail Miyamoto M.D.    On: 03/10/2014 12:13         Verified By: Areta Haber, M.D.,  Cardiology:    07-Jun-15 14:01, Echo Doppler  Echo Doppler   REASON FOR EXAM:      COMMENTS:       PROCEDURE: [facility] - ECHO DOPPLER COMPLETE(TRANSTHOR)  - Mar 10 2014  2:01PM     RESULT: Echocardiogram Report    Patient Name:   Susan [name] Date of Exam: 03/10/2014  Medical Rec #:  [id number]                 Custom1:  Date of Birth:  07-19-1932               Height:       66.0 in  Patient Age:    30 years               Weight:       180.0 lb  Patient Gender: F                      BSA:          1.91 m??    Indications: Arrhythmia  Sonographer:    Arville Go RDCS  Referring Phys: Ferman Hamming, L    Sonographer Comments: Technically difficult study due to poor echo   windows and suboptimal subcostal window.    Summary:   1. Left ventricular ejection  fraction, by visual estimation, is 55 to   60%.   2. Normal global left ventricular systolic function.   3. Mild concentric left ventricular hypertrophy.   4. Grade 1 diastolic dysfunction.   5. Trivial pericardial effusion.   6. Mild to moderate aortic valve sclerosis/calcification without any   evidence of aortic stenosis.  2D AND M-MODE MEASUREMENTS (normal ranges within parentheses):  Left Ventricle:          Normal  IVSd (2D):      1.24 cm (0.7-1.1)  LVPWd (2D):     1.22 cm (0.7-1.1) Aorta/LA:                  Normal  LVIDd (2D):     3.93 cm (3.4-5.7) Aortic Root (2D): 3.00 cm (2.4-3.7)  LVIDs (2D):     2.70 cm           Left Atrium (2D): 3.10 cm (1.9-4.0)  LV FS (2D):     31.3 %   (>25%)  LV EF (2D):     59.8 %   (>50%)                                    Right Ventricle:                       RVd (2D):  LV DIASTOLIC FUNCTION:  MV Peak E: 0.69 m/s Decel Time: 280 msec  MV Peak A: 1.12 m/s  E/A Ratio: 0.61  SPECTRAL DOPPLER ANALYSIS (where applicable):  Mitral Valve:  MV P1/2 Time: 81.20 msec  MV Area, PHT: 2.71 cm??  Aortic Valve: AoV Max Vel: 1.79 m/s AoV Peak PG: 12.8 mmHg AoV Mean PG:  LVOT Vmax: 1.11 m/s LVOT VTI:  LVOT Diameter: 2.00 cm  AoV Area, Vmax: 1.95 cm?? AoV Area, VTI:  AoV Area, Vmn:  Tricuspid Valve and PA/RV Systolic Pressure: TR Max Velocity: 2.09 m/s RA   Pressure: 5 mmHg RVSP/PASP: 22.5 mmHg  Pulmonic Valve:  PV Max Velocity: 0.83 m/s PV Max PG: 2.7 mmHg PV Mean PG:  Pulmonic Insufficiency:  PI EDV: 1.13 m/s PADP: 10.1 mmHg    PHYSICIAN INTERPRETATION:  Left Ventricle: The left ventricular internal cavity size was normal.   Mild concentric left ventricular hypertrophy. Global LV systolic function     was normal. Left ventricular ejection fraction, by visual estimation, is   55 to 60%.  Right Ventricle: Normal right ventricular size, wall thickness, and   systolic function.  Left Atrium: The left atrium is normal in size and structure.  Right  Atrium: The right atrium is normal in size and structure.  Pericardium: Trivial pericardial effusion is present.  Mitral Valve: The mitral valve is normal in structure. Trace mitral valve   regurgitation is seen.  Tricuspid Valve: The tricuspid valve is normal. Trivial tricuspid   regurgitation is visualized. The tricuspid regurgitant velocity is 2.09   m/s, and with an assumed right atrial pressure of 5 mmHg, the estimated   right ventricular systolic pressure is normal at 22.5 mmHg.  Aortic Valve: The aortic valve is tricuspid. Mild to moderate aortic   valve sclerosis/calcification is present, without any evidence of aortic     stenosis.No evidence of aortic valve regurgitation is seen.  Pulmonic Valve: The pulmonic valve is normal. Trace pulmonic valve   regurgitation.  Aorta: The aortic root is normal in size and structure.  Venous: The inferior vena cava was normal. The inferior vena cava was   normal sized.    [id number] Kathlyn Sacramento MD  Electronically signed by [id number] Kathlyn Sacramento MD  Signature Date/Time: 03/10/2014/7:55:53 PM    *** Final ***    IMPRESSION: .    Verified By: Mertie Clause. ARIDA, M.D., MD    No Known Allergies:   Vital Signs/Nurse's Notes: **Vital Signs.:   08-Jun-15 05:19  Temperature Temperature (F) 98.1  Celsius 36.7  Temperature Source oral  Pulse Pulse 77  Respirations Respirations 18  Systolic BP Systolic BP 973  Diastolic BP (mmHg) Diastolic BP (mmHg) 83  Mean BP 99  Pulse Ox % Pulse Ox % 94  Pulse Ox Activity Level  At rest  Oxygen Delivery Room Air/ 21 %  *Intake and Output.:   Daily 08-Jun-15 07:00  Grand Totals Intake:  1290 Output:  550    Net:  740 24 Hr.:  740  Oral Intake      In:  240  IV (Primary)      In:  1050  Urine ml     Out:  550  Length of Stay Totals Intake:  1290 Output:  550    Net:  740    Impression 1.  WCT:  Pt presented yesterday after prolonged symptomatic palpitations on Friday and again yesterday.  EMS  apparently documented WCT with rates in the 170's however no strips are available on the chart.  I have called [facility] and have requested that those strips be faxed to [place] on 2A today.  Awaiting that transmission now.  K and Mg were low on admission and these have been corrected.  TSH nl. Echo shows nl LV fxn, thus making VT less likely, esp with nl troponins.  Suspect we will find an SVT in the setting of RBBB.  Agree with BB therapy.  If we can not get our hands on the EMS strips, would plan on 30 day event monitor.  Further recs pending strips.  2.  HTN:  stable.  3.  HL:  On statin.  LFT's wnl.  TC 163.  4. DM:  A1c 6.9.  No prior h/o DM.  Per IM.   Electronic Signatures for Addendum Section:  Kathlyn Sacramento (MD) (Signed Addendum 08-Jun-15 10:13)  The patient was seen and examined. Agree with the above. She presentd with tachycardia likely SVT vs. A-fib. Now in NSR. ECho was ok. She has underlying RBBB. No further arrhythmia.  Agree with Metoprolol. OK to discharge home. If not able to find EMS rhythm strips, then a 30 day outpatient telemetry is recommended. This can be arranged through our office.   Electronic Signatures: Kathlyn Sacramento (MD)  (Signed 08-Jun-15 10:13)  Co-Signer: General Aspect/Present Illness, History and Physical Exam, Review of System, Family & Social History, Past Medical History, Home Medications, Labs, EKG , Radiology, Allergies, Vital Signs/Nurse's Notes, Impression/Plan Rogelia Mire (NP)  (Signed 08-Jun-15 09:45)  Authored: General Aspect/Present Illness, History and Physical Exam, Review of System, Family & Social History, Past Medical History, Home Medications, Labs, EKG , Radiology, Allergies, Vital Signs/Nurse's Notes, Impression/Plan   Last Updated: 08-Jun-15 10:13 by Kathlyn Sacramento (MD)

## 2015-01-25 NOTE — H&P (Signed)
PATIENT NAME:  Susan [name], Susan [name] MR#:  [id number] DATE OF BIRTH:  07-14-1932  DATE OF ADMISSION:  03/10/2014  PRIMARY CARE PROVIDER: Leona Carry. Hall Busing, MD  EMERGENCY DEPARTMENT REFERRING PHYSICIAN: Sheryl L. Benjaman Lobe, MD  CHIEF COMPLAINT: Dizziness, near syncope, tachycardia.   HISTORY OF PRESENT ILLNESS: The patient is an 81 year old white female with history of hypertension who reports that she has not been feeling well since last Friday. On Friday, she went out and she was starting to feel dizzy and felt like she was going to pass out. This happened  on off on Friday, then Saturday she did okay, then this morning around 8:30 she started feeling same. She felt like her heart was beating fast. It continued to persist, that is when she called EMS because she thought she was going to pass out. When EMS arrived, they noticed a heart rate of 173 and a wide-complex tachycardia that lasted about a minute. The patient was brought to the ED. In the ER, she has not had any further arrhythmias. She is noted to have a low potassium and a magnesium that is low. She otherwise denies any chest pains, palpitations. No shortness of breath. No dyspnea on exertion. No nausea, vomiting or diarrhea.   PAST MEDICAL HISTORY:  History of hypertension and hyperlipidemia, the patient is being treated. Right macular degeneration.    PAST SURGICAL HISTORY: None.   ALLERGIES: None.   MEDICATIONS: HCTZ 25 daily, atorvastatin 20 daily, amlodipine/benazepril 5/20 daily.   SOCIAL HISTORY: Does not smoke. Drinks a few times a week, a small mixed drink, not on a daily basis. No drug use.   FAMILY HISTORY: Positive for hypertension.   REVIEW OF SYSTEMS:    CONSTITUTIONAL: Denies any fevers, fatigue, weakness. No pain. No weight loss. No weight gain.  EYES: No blurred or double vision. No pain. No redness. She has macular degeneration.  ENT: No tinnitus. No ear pain. No hearing loss. No seasonal or year-round allergies. No  epistaxis. No difficulty swallowing.  RESPIRATORY: Denies any cough, wheezing, hemoptysis. No chronic obstructive pulmonary disease.  CARDIOVASCULAR: Denies any chest pain, orthopnea, edema, arrhythmia as above. Did have palpitations over the past few days, had near syncope.  GASTROINTESTINAL: No nausea, vomiting, diarrhea. No abdominal pain. No hematemesis. No melena. No guarding. No irritable bowel syndrome. No jaundice.  GENITOURINARY: Denies any dysuria, hematuria, renal calculi or frequency.  ENDOCRINE: Denies any polyuria, nocturia or thyroid problems.  HEMATOLOGIC AND LYMPHATIC: Denies anemia, easy bruisability or bleeding.  SKIN: No acne. No rash.  MUSCULOSKELETAL: Denies any pain in the neck, back or shoulder.  NEUROLOGICAL:  No numbness, cerebrovascular accident, transient ischemic attack.  PSYCHIATRIC: No anxiety, insomnia or attention deficit disorder or obsessive-compulsive disorder.   PHYSICAL EXAMINATION: VITAL SIGNS: Temperature 98.2, pulse 89, respirations 20, blood pressure 156/80.  GENERAL: The patient is a well-developed, well-nourished female, obese, in no acute distress.  HEENT: Head atraumatic, normocephalic. Pupils equally round, reactive to light and accommodation. There is no conjunctival pallor. No scleral icterus. Nasal exam shows no drainage or ulceration. Oropharynx clear without any exudate.  NECK: Supple without any JVD.  CARDIOVASCULAR: Regular rate and rhythm. No murmurs, rubs, clicks or gallops.  LUNGS: Clear to auscultation bilaterally without any rales, rhonchi, wheezing.  ABDOMEN: Soft, nontender, nondistended. Positive bowel sounds x 4.  EXTREMITIES: No clubbing, cyanosis or edema.  SKIN: No rash.  LYMPHATICS: No lymph nodes palpable.  VASCULAR: Good DP, PT pulses.  PSYCHIATRIC: Not anxious or depressed.  NEUROLOGIC: Awake,  alert, oriented x 3. No focal deficits.   LABORATORY, DIAGNOSTIC AND RADIOLOGICAL DATA: In the ED chest x-ray showed cardiomegaly  without congestive heart failure. Laboratory data: Glucose 142, BUN 13, creatinine 0.61, sodium 134, potassium 3.3, chloride 100, CO2 27, calcium is 9.0, magnesium 1.7. LFTs are normal. CPK 97, CK-MB 1.0. Troponin less than 0.02. TSH 3.16. WBC 8.1, hemoglobin 14.6, platelet count 251. Urinalysis is negative. Chest x-ray cardiomegaly without evidence of congestive heart failure.  EKG normal sinus rhythm with right bundle branch block.   ASSESSMENT AND PLAN: The patient is an 81 year old white female being admitted for dizziness, near syncope, noted to have tachycardia.  1.  Dizziness, near syncope, likely due to wide-complex tachycardia. Possible ventricular tachycardia, possible supraventricular tachycardia. At this time, [name] will start her on metoprolol b.[name].d., replace magnesium and potassium. Will get an echocardiogram of the heart, cardiology consult. We will monitor on telemetry. If she develops any further wide-complex tachycardia, she will be started on an amiodarone drip.  2.  Hypertension. [name] will start her on metoprolol, continue amlodipine and benazepril, hold her hydrochlorothiazide.   3.  Hypokalemia, hypomagnesemia. We will replace her electrolytes.  4.  Hyperlipidemia. Continue atorvastatin, check a lipid panel in the a.m.  5.  Hyperglycemia noted on BMP. Check a hemoglobin A1c.   6.  Miscellaneous. The patient will be on heparin for deep vein thrombosis prophylaxis.   TIME SPENT: 45 minutes.    ____________________________ Lafonda Mosses Posey Pronto, MD shp:cs D: 03/10/2014 13:38:20 ET T: 03/10/2014 15:00:57 ET JOB#: [id number]  cc: Ceclia Koker H. Posey Pronto, MD, <Dictator> Alric Seton MD ELECTRONICALLY SIGNED 03/19/2014 16:51

## 2015-01-25 NOTE — Discharge Summary (Signed)
PATIENT NAME:  Susan [name], Susan [name] I MR#:  [id number] DATE OF BIRTH:  Sep 20, 1932  DATE OF ADMISSION:  03/10/2014 DATE OF DISCHARGE:  03/11/2014  ADMITTING PHYSICIAN: Dustin Flock, MD  DISCHARGING PHYSICIAN: Gladstone Lighter, MD  PRIMARY CARE PHYSICIAN: Dr. Benita Stabile  CONSULTATIONS IN THE HOSPITAL: Cardiology with Dr. Fletcher Anon.  DISCHARGE DIAGNOSES:  1.  Cardiac arrhythmia/tachycardia, likely supraventricular tachycardia or atrial fibrillation, paroxysmal. 2.  Hypertension.  3.  Hyperkalemia and hypermagnesemia.  DISCHARGE HOME MEDICATIONS: 1.  Metoprolol 50 mg p.o. b.i.d.  2.  Amlodipine/benazepril 5 mg/20 mg 1 tablet p.o. daily.  3.  Atorvastatin 20 mg p.o. daily.  4.  Aspirin 325 mg p.o. daily.  5.  Restasis 0.05% ophthalmic solution 1 drop in each eye at bedtime.   DISCHARGE DIET: Low-sodium.  DISCHARGE ACTIVITY: As tolerated.  FOLLOWUP INSTRUCTIONS: 1.  Cardiology follow-up as scheduled this week.  2.  PCP follow-up in 2 weeks.  LABS AND IMAGING STUDIES PRIOR TO DISCHARGE: WBC 7.7, hemoglobin 13.5, hematocrit 41.1, platelet count 238,000.   Sodium 137, potassium 4.1, chloride 108, bicarb 22, BUN 9, creatinine 0.52, glucose 138, and calcium 8.4. Magnesium 2.0.  LDL cholesterol 52, HDL 44, total cholesterol 163, triglycerides 333. Troponins remained negative in the hospital.   Echo Doppler showing normal LV ejection fraction, EF of 55% to 60%, mild concentric LVH, and mild to moderate aortic valve sclerosis without any evidence of aortic stenosis noted.   Chest x-ray on admission showing cardiomegaly without congestive heart failure.   Urinalysis negative for any infection. TSH within normal limits at 3.16. HbA1c slightly elevated at 6.9.  BRIEF HOSPITAL COURSE: Susan [name] is a very pleasant, independent and highly functional 81 year old Caucasian female with past medical history significant only for hypertension, presents to the hospital secondary to tachycardia and  dizziness yesterday. Her symptoms started 2 days prior to admission with feeling rush in her chest and also dizziness, but resolved within a few hours; however, on March 10, 2014 similar symptoms occurred, but not feeling better called EMS. She was noted to have a wide-complex tachycardia and heart rate of up to 172 with EMS that resolved spontaneously without any medication intervention by the time she came to the ER, so we do not know exactly what kind of rhythm she had because the EMS strips are not available at this time.  1.  Wide complex tachycardia, likely paroxysmal A-fib or SVT, which resolved spontaneously and no prior cardiac history. She is in normal sinus rhythm with right bundle branch block now. Seen by cardiology now in the hospital. Since she is asymptomatic, continue metoprolol which was added on in the hospital, and she will need outpatient 30 day event monitor, which will be arranged. Dr. Tyrell Antonio nurse practitioner has requested the strips from [facility]. Once they are available, the patient will follow-up with Dr. Fletcher Anon as an outpatient for the event monitor.  2.  Hypokalemia and hypomagnesemia, likely from being on HCTZ. HCTZ is stopped. The patient is being discharged on metoprolol. They were replaced and normalized now.  3.  Hypertension. On amlodipine and benazepril, which she will continue, and metoprolol is added instead of hydrochlorothiazide.   Her course has been otherwise uneventful in the hospital.   DISCHARGE CONDITION: Stable.   DISCHARGE DISPOSITION: Home.   TIME SPENT ON DISCHARGE: 45 minutes.  ____________________________ Gladstone Lighter, MD rk:sb D: 03/11/2014 13:41:46 ET T: 03/11/2014 14:23:13 ET JOB#: [id number]  cc: Gladstone Lighter, MD, <Dictator> Susan [name]. Susan Busing, MD Susan [name]. Fletcher Anon, MD  Gladstone Lighter MD ELECTRONICALLY SIGNED 03/16/2014 14:26

## 2015-03-17 ENCOUNTER — Other Ambulatory Visit: Payer: Self-pay

## 2015-03-17 MED ORDER — METOPROLOL TARTRATE 25 MG PO TABS: 25.0000 mg | ORAL_TABLET | Freq: Two times a day (BID) | ORAL | Status: DC

## 2015-03-17 NOTE — Telephone Encounter (Signed)
Refill sent for metoprolol.  

## 2015-03-20 ENCOUNTER — Other Ambulatory Visit: Payer: Self-pay

## 2015-03-20 DIAGNOSIS — Z1231 Encounter for screening mammogram for malignant neoplasm of breast: Secondary | ICD-10-CM

## 2015-04-25 ENCOUNTER — Other Ambulatory Visit: Payer: Self-pay | Admitting: General Surgery

## 2015-04-25 ENCOUNTER — Ambulatory Visit
Admission: RE | Admit: 2015-04-25 | Discharge: 2015-04-25 | Disposition: A | Discharge status: Home / Self Care | Payer: Medicare Other | Source: Ambulatory Visit | Attending: General Surgery | Admitting: General Surgery

## 2015-04-25 ENCOUNTER — Other Ambulatory Visit: Payer: Self-pay | Admitting: *Deleted

## 2015-04-25 DIAGNOSIS — Z1231 Encounter for screening mammogram for malignant neoplasm of breast: Secondary | ICD-10-CM

## 2015-04-25 MED ORDER — LOSARTAN POTASSIUM-HCTZ 50-12.5 MG PO TABS: 1.0000 | ORAL_TABLET | Freq: Every day | ORAL | Status: DC

## 2015-04-28 ENCOUNTER — Other Ambulatory Visit: Payer: Self-pay

## 2015-04-28 MED ORDER — LOSARTAN POTASSIUM-HCTZ 50-12.5 MG PO TABS: 1.0000 | ORAL_TABLET | Freq: Every day | ORAL | Status: DC

## 2015-04-28 NOTE — Telephone Encounter (Signed)
Refill sent for Losartan HCTZ 50/12.5 mg take one tablet daily.

## 2015-04-29 ENCOUNTER — Other Ambulatory Visit: Payer: Self-pay

## 2015-04-29 MED ORDER — LOSARTAN POTASSIUM-HCTZ 50-12.5 MG PO TABS: 1.0000 | ORAL_TABLET | Freq: Every day | ORAL | Status: DC

## 2015-04-29 NOTE — Telephone Encounter (Signed)
Refill sent for losartan hctz 50/12.5 take one tablet daily.

## 2015-05-03 HISTORY — PX: COLONOSCOPY: SHX174

## 2015-05-06 ENCOUNTER — Ambulatory Visit (INDEPENDENT_AMBULATORY_CARE_PROVIDER_SITE_OTHER): Payer: Medicare Other | Admitting: General Surgery

## 2015-05-06 ENCOUNTER — Encounter: Payer: Self-pay | Admitting: General Surgery

## 2015-05-06 VITALS — BP 124/76 | HR 74 | Resp 12 | Ht 66.0 in | Wt 185.0 lb

## 2015-05-06 DIAGNOSIS — N6019 Diffuse cystic mastopathy of unspecified breast: Secondary | ICD-10-CM | POA: Diagnosis not present

## 2015-05-06 NOTE — Progress Notes (Signed)
Patient ID: Susan [name], female   DOB: 28-Sep-1932, 81 y.o.   MRN: [id number]  Chief Complaint  Patient presents with  . Follow-up    mammogram    HPI Susan [name] is a 81 y.o. female who presents for a breast evaluation. The most recent mammogram was done on 04/25/15.  Patient does perform regular self breast checks and gets regular mammograms done.    HPI  Past Medical History  Diagnosis Date  . Diffuse cystic mastopathy   . Colon polyp   . Hyperlipidemia   . Macular degeneration   . Tachycardia   . Hypertension     Past Surgical History  Procedure Laterality Date  . Colonoscopy  05/03/2015  . Breast biopsy Right 2006  . Salpingo oophorectmy    . Appendectomy    . Abdominal hysterectomy    . Eye surgery Bilateral 2014    cataract    Family History  Problem Relation Age of Onset  . Family history unknown: Yes    Social History History  Substance Use Topics  . Smoking status: Never Smoker   . Smokeless tobacco: Never Used  . Alcohol Use: No    Allergies  Allergen Reactions  . No Known Allergies     Current Outpatient Prescriptions  Medication Sig Dispense Refill  . atorvastatin (LIPITOR) 20 MG tablet Take 20 mg by mouth daily.    . Biotin 5000 MCG CAPS Take 1 capsule by mouth daily.    . cycloSPORINE (RESTASIS) 0.05 % ophthalmic emulsion 1 drop daily.    Marland Kitchen losartan-hydrochlorothiazide (HYZAAR) 50-12.5 MG per tablet Take 1 tablet by mouth daily. 90 tablet 3  . metoprolol tartrate (LOPRESSOR) 25 MG tablet Take 1 tablet (25 mg total) by mouth 2 (two) times daily. 180 tablet 3  . Multiple Vitamins-Minerals (PRESERVISION AREDS 2) CAPS Take 1 capsule by mouth daily.     No current facility-administered medications for this visit.    Review of Systems Review of Systems  Constitutional: Negative.   Respiratory: Negative.   Cardiovascular: Negative.     Blood pressure 124/76, pulse 74, resp. rate 12, height 5\' 6"  (1.676 m), weight 185 lb (83.915  kg).  Physical Exam Physical Exam  Constitutional: She is oriented to person, place, and time. She appears well-developed and well-nourished.  Eyes: Conjunctivae are normal. No scleral icterus.  Neck: Neck supple.  Cardiovascular: Normal rate, regular rhythm and normal heart sounds.   Pulmonary/Chest: Effort normal and breath sounds normal. Right breast exhibits no inverted nipple, no mass, no nipple discharge, no skin change and no tenderness. Left breast exhibits no inverted nipple, no mass, no nipple discharge, no skin change and no tenderness.  Abdominal: Soft. Bowel sounds are normal. There is no hepatomegaly. There is no tenderness.  Lymphadenopathy:    She has no cervical adenopathy.    She has no axillary adenopathy.  Neurological: She is alert and oriented to person, place, and time.  Skin: Skin is warm and dry.    Data Reviewed Mammogram and MRI reviewed. Stable.   Assessment    Fibrocystic  Disease. Stable exam    Plan         The patient has been asked to return to the office in one year with a bilateral screening mammogram. Continue self breast exams. Call office for any new breast issues or concerns.   Alora Gorey G 05/06/2015, 4:27 PM

## 2015-05-06 NOTE — Patient Instructions (Addendum)
The patient has been asked to return to the office in one year with a bilateral screening mammogram. Continue self breast exams. Call office for any new breast issues or concerns.  

## 2015-11-10 ENCOUNTER — Encounter: Payer: Self-pay | Admitting: Cardiovascular Disease

## 2015-11-10 ENCOUNTER — Ambulatory Visit (INDEPENDENT_AMBULATORY_CARE_PROVIDER_SITE_OTHER): Payer: Medicare Other | Admitting: Cardiovascular Disease

## 2015-11-10 VITALS — BP 140/86 | HR 73 | Ht 66.0 in | Wt 191.5 lb

## 2015-11-10 DIAGNOSIS — I10 Essential (primary) hypertension: Secondary | ICD-10-CM

## 2015-11-10 DIAGNOSIS — I471 Supraventricular tachycardia: Secondary | ICD-10-CM

## 2015-11-10 MED ORDER — METOPROLOL TARTRATE 25 MG PO TABS: 25.0000 mg | ORAL_TABLET | Freq: Three times a day (TID) | ORAL | Status: DC

## 2015-11-10 NOTE — Assessment & Plan Note (Signed)
The patient is overall doing reasonably well. However, she describes increased palpitations in the evening just before she takes her p.m. dose. Thus, I decided to change metoprolol to 25 mg 3 times daily.

## 2015-11-10 NOTE — Assessment & Plan Note (Signed)
Leg edema resolved since she was switched from amlodipine to hydrochlorothiazide. Blood pressure is reasonably controlled.

## 2015-11-10 NOTE — Patient Instructions (Signed)
Medication Instructions:  Your physician has recommended you make the following change in your medication:  INCREASE metoprolol to 25mg  three times daily   Labwork: none  Testing/Procedures: none  Follow-Up: Your physician wants you to follow-up in: six months with Dr. Fletcher Anon.  You will receive a reminder letter in the mail two months in advance. If you don't receive a letter, please call our office to schedule the follow-up appointment.   Any Other Special Instructions Will Be Listed Below (If Applicable).     If you need a refill on your cardiac medications before your next appointment, please call your pharmacy.

## 2015-11-10 NOTE — Progress Notes (Signed)
Primary care physician: Dr. Benita Stabile  HPI  This is an 82 year old female who is here today for a followup visit regarding suspected supraventricular tachycardia.She has known history of hypertension. She presented in June, 2015 with palpitations, dizziness and presyncope. She was noted by EMS to be tachycardic with a heart rate of 170 beats per minute with underlying right bundle branch block. Rhythm strips were not recorded. She converted to normal sinus rhythm without intervention. Echocardiogram showed normal LV systolic function with no significant valvular abnormalities.  30 day outpatient telemetry showed no evidence of arrhythmia. Specifically, there was no evidence of atrial fibrillation. She has been doing reasonably well. She describes palpitations in the evening hours just before she takes her evening dose of metoprolol. She is usually very stressed and anxious about that. Otherwise no chest pain or shortness of breath.   Allergies  Allergen Reactions  . No Known Allergies      Current Outpatient Prescriptions on File Prior to Visit  Medication Sig Dispense Refill  . atorvastatin (LIPITOR) 20 MG tablet Take 20 mg by mouth daily.    . Biotin 5000 MCG CAPS Take 1 capsule by mouth daily.    . cycloSPORINE (RESTASIS) 0.05 % ophthalmic emulsion 1 drop daily.    Marland Kitchen losartan-hydrochlorothiazide (HYZAAR) 50-12.5 MG per tablet Take 1 tablet by mouth daily. 90 tablet 3  . metoprolol tartrate (LOPRESSOR) 25 MG tablet Take 1 tablet (25 mg total) by mouth 2 (two) times daily. 180 tablet 3  . Multiple Vitamins-Minerals (PRESERVISION AREDS 2) CAPS Take 1 capsule by mouth daily.     No current facility-administered medications on file prior to visit.     Past Medical History  Diagnosis Date  . Diffuse cystic mastopathy   . Colon polyp   . Hyperlipidemia   . Macular degeneration   . Tachycardia   . Hypertension      Past Surgical History  Procedure Laterality Date  . Colonoscopy   05/03/2015  . Breast biopsy Right 2006  . Salpingo oophorectmy    . Appendectomy    . Abdominal hysterectomy    . Eye surgery Bilateral 2014    cataract     Family History  Problem Relation Age of Onset  . Family history unknown: Yes     Social History   Social History  . Marital Status: Divorced    Spouse Name: N/A  . Number of Children: N/A  . Years of Education: N/A   Occupational History  . Not on file.   Social History Main Topics  . Smoking status: Never Smoker   . Smokeless tobacco: Never Used  . Alcohol Use: No  . Drug Use: No  . Sexual Activity: Not on file   Other Topics Concern  . Not on file   Social History Narrative     ROS A 10 point review of system was performed. It is negative other than that mentioned in the history of present illness.   PHYSICAL EXAM   BP 140/86 mmHg  Pulse 73  Ht 5\' 6"  (1.676 m)  Wt 191 lb 8 oz (86.864 kg)  BMI 30.92 kg/m2 Constitutional: She is oriented to person, place, and time. She appears well-developed and well-nourished. No distress.  HENT: No nasal discharge.  Head: Normocephalic and atraumatic.  Eyes: Pupils are equal and round. No discharge.  Neck: Normal range of motion. Neck supple. No JVD present. No thyromegaly present.  Cardiovascular: Normal rate, regular rhythm, normal heart sounds. Exam reveals no gallop  and no friction rub. No murmur heard.  Pulmonary/Chest: Effort normal and breath sounds normal. No stridor. No respiratory distress. She has no wheezes. She has no rales. She exhibits no tenderness.  Abdominal: Soft. Bowel sounds are normal. She exhibits no distension. There is no tenderness. There is no rebound and no guarding.  Musculoskeletal: Normal range of motion. She exhibits no edema and no tenderness.  Neurological: She is alert and oriented to person, place, and time. Coordination normal.  Skin: Skin is warm and dry. No rash noted. She is not diaphoretic. No erythema. No pallor.    Psychiatric: She has a normal mood and affect. Her behavior is normal. Judgment and thought content normal.    [id number]  Rhythm  -Right bundle branch block.   -  Nonspecific T-abnormality.   ABNORMAL    ASSESSMENT AND PLAN

## 2016-01-28 ENCOUNTER — Other Ambulatory Visit: Payer: Self-pay

## 2016-01-28 DIAGNOSIS — Z1231 Encounter for screening mammogram for malignant neoplasm of breast: Secondary | ICD-10-CM

## 2016-04-27 ENCOUNTER — Other Ambulatory Visit: Payer: Self-pay | Admitting: General Surgery

## 2016-04-27 ENCOUNTER — Ambulatory Visit
Admission: RE | Admit: 2016-04-27 | Discharge: 2016-04-27 | Disposition: A | Discharge status: Home / Self Care | Payer: Medicare Other | Source: Ambulatory Visit | Attending: General Surgery | Admitting: General Surgery

## 2016-04-27 DIAGNOSIS — Z1231 Encounter for screening mammogram for malignant neoplasm of breast: Secondary | ICD-10-CM | POA: Insufficient documentation

## 2016-04-29 ENCOUNTER — Encounter: Payer: Self-pay | Admitting: *Deleted

## 2016-05-04 ENCOUNTER — Ambulatory Visit: Payer: Medicare Other | Admitting: General Surgery

## 2016-05-06 ENCOUNTER — Encounter: Payer: Self-pay | Admitting: General Surgery

## 2016-05-06 ENCOUNTER — Ambulatory Visit: Payer: Medicare Other | Admitting: General Surgery

## 2016-05-06 ENCOUNTER — Ambulatory Visit (INDEPENDENT_AMBULATORY_CARE_PROVIDER_SITE_OTHER): Payer: Medicare Other | Admitting: General Surgery

## 2016-05-06 VITALS — BP 124/78 | HR 82 | Resp 12 | Ht 66.0 in | Wt 186.0 lb

## 2016-05-06 DIAGNOSIS — N6019 Diffuse cystic mastopathy of unspecified breast: Secondary | ICD-10-CM | POA: Diagnosis not present

## 2016-05-06 NOTE — Patient Instructions (Addendum)
The patient has been asked to return to the office in one year with a bilateral screening mammogram. 

## 2016-05-06 NOTE — Progress Notes (Signed)
Patient ID: Susan [name], female   DOB: [birth year], 82 y.o.   MRN: [id number]  Chief Complaint  Patient presents with  . Follow-up    mammogram    HPI ADRIONNA [name] is a 82 y.o. female who presents for her yearly breast evaluation. She is status post bilateral breast biopsy (2006). The most recent mammogram was done on 04/27/16. Patient does perform regular self breast checks and gets regular mammograms done. She reports new breast issues. I have reviewed the history of present illness with the patient.  HPI  Past Medical History:  Diagnosis Date  . Colon polyp   . Diffuse cystic mastopathy   . Hyperlipidemia   . Hypertension   . Macular degeneration   . Tachycardia     Past Surgical History:  Procedure Laterality Date  . ABDOMINAL HYSTERECTOMY    . APPENDECTOMY    . BREAST BIOPSY Bilateral 2006   benign, clip markers in both breasts  . COLONOSCOPY  05/03/2015  . EYE SURGERY Bilateral 2014   cataract  . salpingo oophorectmy      Family History  Problem Relation Age of Onset  . Lung cancer Father   . Kidney cancer Maternal Aunt     Social History Social History  Substance Use Topics  . Smoking status: Never Smoker  . Smokeless tobacco: Never Used  . Alcohol use No    Allergies  Allergen Reactions  . No Known Allergies     Current Outpatient Prescriptions  Medication Sig Dispense Refill  . atorvastatin (LIPITOR) 20 MG tablet Take 20 mg by mouth daily.    . Biotin 5000 MCG CAPS Take 1 capsule by mouth daily.    . cycloSPORINE (RESTASIS) 0.05 % ophthalmic emulsion 1 drop daily.    Marland Kitchen losartan-hydrochlorothiazide (HYZAAR) 50-12.5 MG per tablet Take 1 tablet by mouth daily. 90 tablet 3  . metoprolol tartrate (LOPRESSOR) 25 MG tablet Take 1 tablet (25 mg total) by mouth 3 (three) times daily. 270 tablet 1  . Multiple Vitamins-Minerals (PRESERVISION AREDS 2) CAPS Take 1 capsule by mouth daily.     No current facility-administered medications for this  visit.     Review of Systems Review of Systems  Constitutional: Negative.   Respiratory: Negative.   Cardiovascular: Negative.     Blood pressure 124/78, pulse 82, resp. rate 12, height 5\' 6"  (1.676 m), weight 186 lb (84.4 kg).  Physical Exam Physical Exam  Constitutional: She is oriented to person, place, and time. She appears well-developed and well-nourished.  Eyes: Conjunctivae are normal. No scleral icterus.  Neck: Neck supple.  Cardiovascular: Normal rate, regular rhythm and normal heart sounds.   Pulmonary/Chest: Effort normal and breath sounds normal. No respiratory distress. Right breast exhibits no inverted nipple, no mass, no nipple discharge, no skin change and no tenderness. Left breast exhibits no inverted nipple, no mass, no nipple discharge, no skin change and no tenderness.  Abdominal: Soft. Bowel sounds are normal. There is no tenderness.  Lymphadenopathy:    She has no cervical adenopathy.    She has no axillary adenopathy.  Neurological: She is alert and oriented to person, place, and time.  Skin: Skin is warm and dry.  Psychiatric: Her behavior is normal.    Data Reviewed Prior notes, mammogram reviewed.  Assessment    Stable physical exam.  History of fibrocystic disease.    Plan    The patient has been asked to return to the office in one year with a bilateral screening  mammogram.    This information has been scribed by Karie Fetch RN, BSN,BC.   Clay Solum G 05/07/2016, 9:27 AM

## 2016-05-07 ENCOUNTER — Encounter: Payer: Self-pay | Admitting: General Surgery

## 2016-05-10 ENCOUNTER — Ambulatory Visit (INDEPENDENT_AMBULATORY_CARE_PROVIDER_SITE_OTHER): Payer: Medicare Other | Admitting: Cardiovascular Disease

## 2016-05-10 ENCOUNTER — Encounter: Payer: Self-pay | Admitting: Cardiovascular Disease

## 2016-05-10 VITALS — BP 108/70 | HR 78 | Ht 66.0 in | Wt 183.0 lb

## 2016-05-10 DIAGNOSIS — I471 Supraventricular tachycardia: Secondary | ICD-10-CM

## 2016-05-10 DIAGNOSIS — I10 Essential (primary) hypertension: Secondary | ICD-10-CM | POA: Diagnosis not present

## 2016-05-10 NOTE — Progress Notes (Signed)
Cardiology Office Note   Date:  05/10/2016   ID:  Susan [name], DOB [birth year], MRN [id number]  PCP:  Albina Billet, MD  Cardiologist:   Kathlyn Sacramento, MD   Chief Complaint  Patient presents with  . Other    6 month follow up. Meds reviewed by the patient verbally. "doing well."       History of Present Illness: Susan [name] is a 82 y.o. female who presents for  a followup visit regarding suspected supraventricular tachycardia.She has known history of hypertension. She presented in June, 2015 with palpitations, dizziness and presyncope. She was noted by EMS to be tachycardic with a heart rate of 170 beats per minute with underlying right bundle branch block. Rhythm strips were not recorded. She converted to normal sinus rhythm without intervention. Echocardiogram showed normal LV systolic function with no significant valvular abnormalities.  30 day outpatient telemetry showed no evidence of arrhythmia.  During last visit, she complained of increased palpitations in the evening. Thus, metoprolol was changed to 3 times daily. She reports resolution of palpitations and overall she is doing very well. She has chronic right bundle branch block.   Past Medical History:  Diagnosis Date  . Colon polyp   . Diffuse cystic mastopathy   . Hyperlipidemia   . Hypertension   . Macular degeneration   . Tachycardia     Past Surgical History:  Procedure Laterality Date  . ABDOMINAL HYSTERECTOMY    . APPENDECTOMY    . BREAST BIOPSY Bilateral 2006   benign, clip markers in both breasts  . COLONOSCOPY  05/03/2015  . EYE SURGERY Bilateral 2014   cataract  . salpingo oophorectmy       Current Outpatient Prescriptions  Medication Sig Dispense Refill  . atorvastatin (LIPITOR) 20 MG tablet Take 20 mg by mouth daily.    . Biotin 5000 MCG CAPS Take 1 capsule by mouth daily.    . cycloSPORINE (RESTASIS) 0.05 % ophthalmic emulsion 1 drop daily.    Marland Kitchen losartan-hydrochlorothiazide  (HYZAAR) 50-12.5 MG per tablet Take 1 tablet by mouth daily. 90 tablet 3  . metoprolol tartrate (LOPRESSOR) 25 MG tablet Take 1 tablet (25 mg total) by mouth 3 (three) times daily. 270 tablet 1  . Multiple Vitamins-Minerals (PRESERVISION AREDS 2) CAPS Take 1 capsule by mouth daily.     No current facility-administered medications for this visit.     Allergies:   No known allergies    Social History:  The patient  reports that she has never smoked. She has never used smokeless tobacco. She reports that she does not drink alcohol or use drugs.   Family History:  The patient's family history includes Kidney cancer in her maternal aunt; Lung cancer in her father.    ROS:  Please see the history of present illness.   Otherwise, review of systems are positive for none.   All other systems are reviewed and negative.    PHYSICAL EXAM: VS:  BP 108/70 (BP Location: Left Arm, Patient Position: Sitting, Cuff Size: Normal)   Pulse 78   Ht 5\' 6"  (1.676 m)   Wt 183 lb (83 kg)   BMI 29.54 kg/m  , BMI Body mass index is 29.54 kg/m. GEN: Well nourished, well developed, in no acute distress  HEENT: normal  Neck: no JVD, carotid bruits, or masses Cardiac: RRR; no murmurs, rubs, or gallops,no edema  Respiratory:  clear to auscultation bilaterally, normal work of breathing GI: soft, nontender, nondistended, +  BS MS: no deformity or atrophy  Skin: warm and dry, no rash Neuro:  Strength and sensation are intact Psych: euthymic mood, full affect   EKG:  EKG is ordered today. The ekg ordered today demonstrates normal sinus rhythm with right bundle branch block.   Recent Labs: No results found for requested labs within last 8760 hours.    Lipid Panel    Component Value Date/Time   CHOL 163 03/11/2014 0522   TRIG 333 (H) 03/11/2014 0522   HDL 44 03/11/2014 0522   VLDL 67 (H) 03/11/2014 0522   LDLCALC 52 03/11/2014 0522      Wt Readings from Last 3 Encounters:  05/10/16 183 lb (83 kg)    05/06/16 186 lb (84.4 kg)  11/10/15 191 lb 8 oz (86.9 kg)        ASSESSMENT AND PLAN:  1.   Paroxysmal supraventricular tachycardia: Palpitations resolved completely since metoprolol was increased to 25 mg 3 times daily. Continue same medications.  2. Essential hypertension: Blood pressure is well controlled on current medications.    Disposition:   FU with me in 1 year  Signed,  Kathlyn Sacramento, MD  05/10/2016 4:01 PM    [facility]

## 2016-05-10 NOTE — Patient Instructions (Signed)
Medication Instructions: Continue same medications.   Labwork: None.   Procedures/Testing: None.   Follow-Up: 1 year with Dr. Arida.   Any Additional Special Instructions Will Be Listed Below (If Applicable).     If you need a refill on your cardiac medications before your next appointment, please call your pharmacy.   

## 2016-06-18 ENCOUNTER — Other Ambulatory Visit: Payer: Self-pay | Admitting: Cardiovascular Disease

## 2016-07-15 ENCOUNTER — Other Ambulatory Visit: Payer: Self-pay | Admitting: *Deleted

## 2016-07-15 MED ORDER — LOSARTAN POTASSIUM-HCTZ 50-12.5 MG PO TABS: 1.0000 | ORAL_TABLET | Freq: Every day | ORAL | 3 refills | Status: DC

## 2016-09-16 ENCOUNTER — Telehealth: Payer: Self-pay | Admitting: Cardiovascular Disease

## 2016-09-16 NOTE — Telephone Encounter (Signed)
Pt calling stating she went to [employer] [facility] and her BP is 216/110 Took already one metoprolol wondering if she needs to take another  190/90 was yesterday's reading. PCP is closed at the moment.  Would like some advise

## 2016-09-16 NOTE — Telephone Encounter (Signed)
Pt reports BP 190/90 yesterday at her optometrist office. 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 She was advised to contact her MD.  She has been taking metoprolol 25mg  BID instead of TID since September. It was increased in August to TID d/t palpitations. As these sx had improved, she decreased frequency. Pt also takes Hyzaar qd. She denies an sx r/t increased BP. Feels to be in her normal state of health. She will take another metoprolol now and I will notify Dr. Fletcher Anon to review and advise.

## 2016-09-16 NOTE — Telephone Encounter (Signed)
S/w pt regarding MD recommendations. She does not have a BP cuff but will purchase one tomorrow and keep a log. She understands to report if BP remains elevated after increasing metoprolol back to TID.  Pt had no further questions at this time.

## 2016-09-16 NOTE — Telephone Encounter (Signed)
Change Metoprolol back to three times daily.  Monitor BP and let us know.

## 2016-10-22 ENCOUNTER — Other Ambulatory Visit: Payer: Self-pay

## 2016-10-22 MED ORDER — LOSARTAN POTASSIUM-HCTZ 50-12.5 MG PO TABS: 1.0000 | ORAL_TABLET | Freq: Every day | ORAL | 3 refills | Status: DC

## 2017-01-31 ENCOUNTER — Other Ambulatory Visit: Payer: Self-pay

## 2017-01-31 DIAGNOSIS — Z1231 Encounter for screening mammogram for malignant neoplasm of breast: Secondary | ICD-10-CM

## 2017-05-09 ENCOUNTER — Ambulatory Visit
Admission: RE | Admit: 2017-05-09 | Discharge: 2017-05-09 | Disposition: A | Discharge status: Home / Self Care | Payer: Medicare Other | Source: Ambulatory Visit | Attending: General Surgery | Admitting: General Surgery

## 2017-05-09 DIAGNOSIS — Z1231 Encounter for screening mammogram for malignant neoplasm of breast: Secondary | ICD-10-CM | POA: Diagnosis not present

## 2017-05-11 ENCOUNTER — Ambulatory Visit (INDEPENDENT_AMBULATORY_CARE_PROVIDER_SITE_OTHER): Payer: Medicare Other | Admitting: General Surgery

## 2017-05-11 VITALS — BP 134/68 | HR 69 | Resp 14 | Ht 62.0 in | Wt 189.0 lb

## 2017-05-11 DIAGNOSIS — N6019 Diffuse cystic mastopathy of unspecified breast: Secondary | ICD-10-CM | POA: Diagnosis not present

## 2017-05-12 ENCOUNTER — Encounter: Payer: Self-pay | Admitting: General Surgery

## 2017-05-12 NOTE — Progress Notes (Addendum)
Patient ID: Susan [name], female   DOB: [birth year], 83 y.o.   MRN: [id number]  Chief Complaint  Patient presents with  . Follow-up    HPI Susan [name] is a 83 y.o. female who presents for a breast evaluation. The most recent mammogram was done on 05/02/2017.  Patient does perform regular self breast checks and gets regular mammograms done. Patient states she is doing well. Has been having trouble with blood pressuere, sees her PCP for this.  HPI  Past Medical History:  Diagnosis Date  . Colon polyp   . Diffuse cystic mastopathy   . Hyperlipidemia   . Hypertension   . Macular degeneration   . Tachycardia     Past Surgical History:  Procedure Laterality Date  . ABDOMINAL HYSTERECTOMY    . APPENDECTOMY    . BREAST BIOPSY Bilateral 2006   benign, clip markers in both breasts  . COLONOSCOPY  05/03/2015  . EYE SURGERY Bilateral 2014   cataract  . salpingo oophorectmy      Family History  Problem Relation Age of Onset  . Lung cancer Father   . Kidney cancer Maternal Aunt   . Breast cancer Neg Hx     Social History Social History  Substance Use Topics  . Smoking status: Never Smoker  . Smokeless tobacco: Never Used  . Alcohol use No    Allergies  Allergen Reactions  . No Known Allergies     Current Outpatient Prescriptions  Medication Sig Dispense Refill  . atorvastatin (LIPITOR) 20 MG tablet Take 20 mg by mouth daily.    . Biotin 5000 MCG CAPS Take 1 capsule by mouth daily.    . cycloSPORINE (RESTASIS) 0.05 % ophthalmic emulsion 1 drop daily.    . hydrochlorothiazide (HYDRODIURIL) 12.5 MG tablet Take 12.5 mg by mouth daily.  3  . losartan (COZAAR) 100 MG tablet Take 100 mg by mouth every evening.  3  . losartan-hydrochlorothiazide (HYZAAR) 50-12.5 MG tablet Take 1 tablet by mouth daily. 90 tablet 3  . metoprolol tartrate (LOPRESSOR) 25 MG tablet TAKE 1 TABLET (25 MG TOTAL) BY MOUTH 3 (THREE) TIMES DAILY. 270 tablet 3  . Multiple Vitamins-Minerals  (PRESERVISION AREDS 2) CAPS Take 1 capsule by mouth daily.    Marland Kitchen XIIDRA 5 % SOLN Place 1 drop into both eyes 2 (two) times daily.  2   No current facility-administered medications for this visit.     Review of Systems Review of Systems  Constitutional: Negative.   Respiratory: Negative.   Cardiovascular: Negative.     Blood pressure 134/68, pulse 69, resp. rate 14, height 5\' 2"  (1.575 m), weight 189 lb (85.7 kg).  Physical Exam Physical Exam  Constitutional: She is oriented to person, place, and time. She appears well-developed and well-nourished.  Eyes: Conjunctivae are normal. No scleral icterus.  Neck: Neck supple.  Cardiovascular: Normal rate, regular rhythm and normal heart sounds.   Pulmonary/Chest: Effort normal and breath sounds normal. Right breast exhibits no inverted nipple, no mass, no nipple discharge, no skin change and no tenderness. Left breast exhibits no inverted nipple, no mass, no nipple discharge, no skin change and no tenderness.  Abdominal: Soft. Bowel sounds are normal. There is no tenderness.  Lymphadenopathy:    She has no cervical adenopathy.    She has no axillary adenopathy.  Neurological: She is alert and oriented to person, place, and time.  Skin: Skin is warm and dry.    Data Reviewed Mammogram reviewed   Assessment  Fibrocystic disease.Stable exam    Plan    Patient to return to her PCP for mammograms and breast checks yearly. The patient is aware to call back for any questions or concerns.  HPI, Physical Exam, Assessment and Plan have been scribed under the direction and in the presence of Mckinley Jewel, MD  Gaspar Cola, CMA    I have completed the exam and reviewed the above documentation for accuracy and completeness.  I agree with the above.  Haematologist has been used and any errors in dictation or transcription are unintentional.  Xela Oregel G. Jamal Collin, M.D., F.A.C.S.     Junie Panning G 05/17/2017, 10:10 AM    Some documentation for today's visit was delayed due to system wide EMR failure on 05-11-17 from 2:30 to 5:30 pm.

## 2017-05-12 NOTE — Patient Instructions (Signed)
Patient to return to her PCP for mammograms and breast checks yearly. The patient is aware to call back for any questions or concerns.

## 2017-05-17 ENCOUNTER — Encounter: Payer: Self-pay | Admitting: Cardiovascular Disease

## 2017-05-17 ENCOUNTER — Ambulatory Visit (INDEPENDENT_AMBULATORY_CARE_PROVIDER_SITE_OTHER): Payer: Medicare Other | Admitting: Cardiovascular Disease

## 2017-05-17 VITALS — BP 132/72 | HR 68 | Ht 66.0 in | Wt 189.5 lb

## 2017-05-17 DIAGNOSIS — I10 Essential (primary) hypertension: Secondary | ICD-10-CM | POA: Diagnosis not present

## 2017-05-17 DIAGNOSIS — I471 Supraventricular tachycardia: Secondary | ICD-10-CM | POA: Diagnosis not present

## 2017-05-17 NOTE — Patient Instructions (Signed)
Medication Instructions: Continue same medications.   Labwork: None.   Procedures/Testing: None.   Follow-Up: 1 year with Dr. Davis Vannatter.   Any Additional Special Instructions Will Be Listed Below (If Applicable).     If you need a refill on your cardiac medications before your next appointment, please call your pharmacy.   

## 2017-05-17 NOTE — Progress Notes (Signed)
Cardiology Office Note   Date:  05/17/2017   ID:  Susan [name], DOB [birth year], MRN [id number]  PCP:  Albina Billet, MD  Cardiologist:   Kathlyn Sacramento, MD   Chief Complaint  Patient presents with  . other    12 month follow up. Meds reviewed by the pt's bottles. Pt. c/o BP fluctuating with shortness of breath on exertion.       History of Present Illness: Susan [name] is a 83 y.o. female who presents for  a followup visit regarding suspected supraventricular tachycardia.She has known history of hypertension. She presented in June, 2015 with palpitations, dizziness and presyncope. She was noted by EMS to be tachycardic with a heart rate of 170 beats per minute with underlying right bundle branch block. Rhythm strips were not recorded. She converted to normal sinus rhythm without intervention. Echocardiogram showed normal LV systolic function with no significant valvular abnormalities.  30 day outpatient telemetry showed no evidence of arrhythmia.   She has been doing well overall with no chest pain. She reports stable exertional dyspnea. She reports elevated blood pressure readings in the morning after she wakes up and she feels side in the afternoon. She is scheduled for sleep study.   Past Medical History:  Diagnosis Date  . Colon polyp   . Diffuse cystic mastopathy   . Hyperlipidemia   . Hypertension   . Macular degeneration   . Tachycardia     Past Surgical History:  Procedure Laterality Date  . ABDOMINAL HYSTERECTOMY    . APPENDECTOMY    . BREAST BIOPSY Bilateral 2006   benign, clip markers in both breasts  . COLONOSCOPY  05/03/2015  . EYE SURGERY Bilateral 2014   cataract  . salpingo oophorectmy       Current Outpatient Prescriptions  Medication Sig Dispense Refill  . aspirin 81 MG tablet Take 81 mg by mouth daily.    Marland Kitchen atorvastatin (LIPITOR) 20 MG tablet Take 20 mg by mouth daily.    . hydrochlorothiazide (HYDRODIURIL) 12.5 MG tablet Take 12.5 mg  by mouth daily.  3  . losartan (COZAAR) 100 MG tablet Take 100 mg by mouth every evening.  3  . metoprolol tartrate (LOPRESSOR) 50 MG tablet Take 50 mg by mouth 3 (three) times daily.    . Multiple Vitamins-Minerals (PRESERVISION AREDS 2) CAPS Take 1 capsule by mouth daily.    Marland Kitchen XIIDRA 5 % SOLN Place 1 drop into both eyes 2 (two) times daily.  2   No current facility-administered medications for this visit.     Allergies:   No known allergies    Social History:  The patient  reports that she has never smoked. She has never used smokeless tobacco. She reports that she does not drink alcohol or use drugs.   Family History:  The patient's family history includes Kidney cancer in her maternal aunt; Lung cancer in her father.    ROS:  Please see the history of present illness.   Otherwise, review of systems are positive for none.   All other systems are reviewed and negative.    PHYSICAL EXAM: VS:  BP 132/72 (BP Location: Left Arm, Patient Position: Sitting, Cuff Size: Normal)   Pulse 68   Ht 5\' 6"  (1.676 m)   Wt 189 lb 8 oz (86 kg)   BMI 30.59 kg/m  , BMI Body mass index is 30.59 kg/m. GEN: Well nourished, well developed, in no acute distress  HEENT: normal  Neck:  no JVD, carotid bruits, or masses Cardiac: RRR; no murmurs, rubs, or gallops,no edema  Respiratory:  clear to auscultation bilaterally, normal work of breathing GI: soft, nontender, nondistended, + BS MS: no deformity or atrophy  Skin: warm and dry, no rash Neuro:  Strength and sensation are intact Psych: euthymic mood, full affect   EKG:  EKG is ordered today. The ekg ordered today demonstrates normal sinus rhythm with right bundle branch block. Possible old inferior infarct   Recent Labs: No results found for requested labs within last 8760 hours.    Lipid Panel    Component Value Date/Time   CHOL 163 03/11/2014 0522   TRIG 333 (H) 03/11/2014 0522   HDL 44 03/11/2014 0522   VLDL 67 (H) 03/11/2014 0522    LDLCALC 52 03/11/2014 0522      Wt Readings from Last 3 Encounters:  05/17/17 189 lb 8 oz (86 kg)  05/12/17 189 lb (85.7 kg)  05/10/16 183 lb (83 kg)        ASSESSMENT AND PLAN:  1.   Paroxysmal supraventricular tachycardia: Reasonably controlled on current dose of metoprolol 50 mg 3 times daily.  2. Essential hypertension: Blood pressure is well controlled on current medications. However, she reports high blood pressure readings in the morning. I agree with evaluation for sleep apnea. Blood pressure is controlled on today's visit. If she continues to have high readings, consider increasing the dose of hydrochlorothiazide to 25 mg once daily.    Disposition:   FU with me in 1 year  Signed,  Kathlyn Sacramento, MD  05/17/2017 2:10 PM    [facility]

## 2018-03-06 ENCOUNTER — Encounter (INDEPENDENT_AMBULATORY_CARE_PROVIDER_SITE_OTHER): Payer: Self-pay | Admitting: Vascular Surgery

## 2018-03-06 ENCOUNTER — Ambulatory Visit (INDEPENDENT_AMBULATORY_CARE_PROVIDER_SITE_OTHER): Payer: Medicare Other | Admitting: Vascular Surgery

## 2018-03-06 VITALS — BP 164/82 | HR 71 | Resp 17 | Ht 66.0 in | Wt 181.4 lb

## 2018-03-06 DIAGNOSIS — S80812A Abrasion, left lower leg, initial encounter: Secondary | ICD-10-CM

## 2018-03-06 DIAGNOSIS — R600 Localized edema: Secondary | ICD-10-CM | POA: Diagnosis not present

## 2018-03-06 DIAGNOSIS — T148XXA Other injury of unspecified body region, initial encounter: Secondary | ICD-10-CM | POA: Insufficient documentation

## 2018-03-06 DIAGNOSIS — I8393 Asymptomatic varicose veins of bilateral lower extremities: Secondary | ICD-10-CM | POA: Diagnosis not present

## 2018-03-06 NOTE — Progress Notes (Signed)
Subjective:    Patient ID: Susan [name], female    DOB: Sep 27, 1932, 84 y.o.   MRN: [id number] Chief Complaint  Patient presents with  . New Patient (Initial Visit)    ref self left le swelling   Patient presents self-referred for evaluation of a skin abrasion located to the medial aspect of her left ankle.  The patient notes that she was recently at the beach and a piece of "weed" touched her skin which made her skin very "itchy".  The patient mixed together bleach and water and scrubbed the area which has now become erythematous and irritated.  The patient is also noted a swelling to the area.  Patient was concerned that she might have an infection.  Is no drainage from the site.  The patient does not engage in conservative therapy including wearing medical grade 1 compression socks or elevating her legs at this time.  The patient denies any fever, nausea vomiting.  The patient does have diffuse varicosities noted to the bilateral lower extremity however she states that these do not cause her any discomfort at this time.  The patient states that her lower extremity does not swell and this is the first time that she has noticed any swelling to the left ankle.  Review of Systems  Constitutional: Negative.   HENT: Negative.   Eyes: Negative.   Respiratory: Negative.   Cardiovascular: Positive for leg swelling.  Gastrointestinal: Negative.   Endocrine: Negative.   Genitourinary: Negative.   Musculoskeletal: Negative.   Skin: Positive for wound.  Allergic/Immunologic: Negative.   Neurological: Negative.   Hematological: Negative.   Psychiatric/Behavioral: Negative.       Objective:   Physical Exam  Constitutional: She is oriented to person, place, and time. She appears well-developed and well-nourished. No distress.  HENT:  Head: Normocephalic and atraumatic.  Right Ear: External ear normal.  Left Ear: External ear normal.  Eyes: Pupils are equal, round, and reactive to light.  Conjunctivae and EOM are normal.  Neck: Normal range of motion.  Cardiovascular: Normal rate, regular rhythm, normal heart sounds and intact distal pulses.  Pulses:      Radial pulses are 2+ on the right side, and 2+ on the left side.       Dorsalis pedis pulses are 2+ on the right side, and 2+ on the left side.       Posterior tibial pulses are 2+ on the right side, and 2+ on the left side.  Pulmonary/Chest: Effort normal and breath sounds normal.  Musculoskeletal: Normal range of motion. She exhibits edema (Mild 1+ pitting edema to the left ankle).  Neurological: She is alert and oriented to person, place, and time.  Skin: Skin is warm and dry. She is not diaphoretic.  1 cm x 1 cm dry abrasion noted to the left medial ankle.  There is no drainage.  There is no surrounding erythema.  There is no gangrenous tissue.  Psychiatric: She has a normal mood and affect. Her behavior is normal. Judgment and thought content normal.  Vitals reviewed.  BP (!) 164/82 (BP Location: Right Arm)   Pulse 71   Resp 17   Ht 5\' 6"  (1.676 m)   Wt 181 lb 6.4 oz (82.3 kg)   BMI 29.28 kg/m   Past Medical History:  Diagnosis Date  . Colon polyp   . Diffuse cystic mastopathy   . Hyperlipidemia   . Hypertension   . Macular degeneration   . Tachycardia  Social History   Socioeconomic History  . Marital status: Divorced    Spouse name: Not on file  . Number of children: Not on file  . Years of education: Not on file  . Highest education level: Not on file  Occupational History  . Not on file  Social Needs  . Financial resource strain: Not on file  . Food insecurity:    Worry: Not on file    Inability: Not on file  . Transportation needs:    Medical: Not on file    Non-medical: Not on file  Tobacco Use  . Smoking status: Never Smoker  . Smokeless tobacco: Never Used  Substance and Sexual Activity  . Alcohol use: No  . Drug use: No  . Sexual activity: Not on file  Lifestyle  . Physical  activity:    Days per week: Not on file    Minutes per session: Not on file  . Stress: Not on file  Relationships  . Social connections:    Talks on phone: Not on file    Gets together: Not on file    Attends religious service: Not on file    Active member of club or organization: Not on file    Attends meetings of clubs or organizations: Not on file    Relationship status: Not on file  . Intimate partner violence:    Fear of current or ex partner: Not on file    Emotionally abused: Not on file    Physically abused: Not on file    Forced sexual activity: Not on file  Other Topics Concern  . Not on file  Social History Narrative  . Not on file   Past Surgical History:  Procedure Laterality Date  . ABDOMINAL HYSTERECTOMY    . APPENDECTOMY    . BREAST BIOPSY Bilateral 2006   benign, clip markers in both breasts  . COLONOSCOPY  05/03/2015  . EYE SURGERY Bilateral 2014   cataract  . salpingo oophorectmy     Family History  Problem Relation Age of Onset  . Lung cancer Father   . Kidney cancer Maternal Aunt   . Breast cancer Neg Hx    Allergies  Allergen Reactions  . No Known Allergies       Assessment & Plan:  Patient presents self-referred for evaluation of a skin abrasion located to the medial aspect of her left ankle.  The patient notes that she was recently at the beach and a piece of "weed" touched her skin which made her skin very "itchy".  The patient mixed together bleach and water and scrubbed the area which has now become erythematous and irritated.  The patient is also noted a swelling to the area.  Patient was concerned that she might have an infection.  Is no drainage from the site.  The patient does not engage in conservative therapy including wearing medical grade 1 compression socks or elevating her legs at this time.  The patient denies any fever, nausea vomiting.  The patient does have diffuse varicosities noted to the bilateral lower extremity however she  states that these do not cause her any discomfort at this time.  The patient states that her lower extremity does not swell and this is the first time that she has noticed any swelling to the left ankle.  1. Skin abrasion - New The area of trauma is noninfected and there is no open skin no cellulitis noted. There is some edema to the left ankle.  Recommend a left lower extremity 3 layer zinc oxide Unna wrap to the leg most likely for about a week or 2 just to control the swelling and help heal the abrasion The patient states that she will cut the Unna boot off on her own in a week  2. Asymptomatic varicose veins of both lower extremities - New The patient has diffuse varicosities to the bilateral lower extremity. We discussed moving forward with an official venous work-up however at this time the patient is not interested The patient was encouraged to engage in conservative therapy including wearing medical grade 1 compression socks, elevating her legs and remaining active The patient expresses her understanding and if she decides to move forward with an official venous work-up she will call the office  3. Bilateral lower extremity edema - New As above  Current Outpatient Medications on File Prior to Visit  Medication Sig Dispense Refill  . aspirin 81 MG tablet Take 81 mg by mouth daily.    Marland Kitchen atorvastatin (LIPITOR) 20 MG tablet Take 20 mg by mouth daily.    . hydrochlorothiazide (HYDRODIURIL) 12.5 MG tablet Take 12.5 mg by mouth daily.  3  . losartan (COZAAR) 100 MG tablet Take 100 mg by mouth every evening.  3  . metFORMIN (GLUCOPHAGE) 500 MG tablet Take 500 mg by mouth daily.  4  . metoprolol tartrate (LOPRESSOR) 50 MG tablet Take 50 mg by mouth 3 (three) times daily.    . Multiple Vitamins-Minerals (PRESERVISION AREDS 2) CAPS Take 1 capsule by mouth daily.    Marland Kitchen XIIDRA 5 % SOLN Place 1 drop into both eyes 2 (two) times daily.  2   No current facility-administered medications on file  prior to visit.    There are no Patient Instructions on file for this visit. No follow-ups on file.  Eulis Salazar A Keeghan Mcintire, PA-C

## 2018-04-04 ENCOUNTER — Other Ambulatory Visit: Payer: Self-pay | Admitting: Internal Medicine

## 2018-04-04 DIAGNOSIS — Z1231 Encounter for screening mammogram for malignant neoplasm of breast: Secondary | ICD-10-CM

## 2018-05-24 ENCOUNTER — Ambulatory Visit
Admission: RE | Admit: 2018-05-24 | Discharge: 2018-05-24 | Disposition: A | Discharge status: Home / Self Care | Payer: Medicare Other | Source: Ambulatory Visit | Attending: Internal Medicine | Admitting: Internal Medicine

## 2018-05-24 DIAGNOSIS — Z1231 Encounter for screening mammogram for malignant neoplasm of breast: Secondary | ICD-10-CM | POA: Diagnosis present

## 2018-05-26 ENCOUNTER — Ambulatory Visit (INDEPENDENT_AMBULATORY_CARE_PROVIDER_SITE_OTHER): Payer: Medicare Other

## 2018-05-26 ENCOUNTER — Ambulatory Visit: Payer: Medicare Other | Admitting: Cardiovascular Disease

## 2018-05-26 ENCOUNTER — Telehealth: Payer: Self-pay | Admitting: Cardiovascular Disease

## 2018-05-26 VITALS — BP 132/70 | HR 62 | Ht 66.0 in | Wt 181.0 lb

## 2018-05-26 DIAGNOSIS — I471 Supraventricular tachycardia: Secondary | ICD-10-CM | POA: Diagnosis not present

## 2018-05-26 DIAGNOSIS — R002 Palpitations: Secondary | ICD-10-CM

## 2018-05-26 DIAGNOSIS — I10 Essential (primary) hypertension: Secondary | ICD-10-CM | POA: Diagnosis not present

## 2018-05-26 NOTE — Patient Instructions (Signed)
Medication Instructions: Your physician recommends that you continue on your current medications as directed. Please refer to the Current Medication list given to you today.  If you need a refill on your cardiac medications before your next appointment, please call your pharmacy.   Procedures/Testing: Your physician has recommended that you wear a 14 day Zio monitor. Event monitors are medical devices that record the heart's electrical activity. Doctors most often [place] these monitors to diagnose arrhythmias. Arrhythmias are problems with the speed or rhythm of the heartbeat. The monitor is a small, portable device. You can wear one while you do your normal daily activities. This is usually used to diagnose what is causing palpitations/syncope (passing out).  Follow-Up: Your physician wants you to follow-up in 2 months with Dr. Fletcher Anon.  Thank you for choosing [facility] at [facility]!

## 2018-05-26 NOTE — Progress Notes (Signed)
Cardiology Office Note   Date:  05/26/2018   ID:  Susan [name], DOB [birth year], MRN [id number]  PCP:  Albina Billet, MD  Cardiologist:   Kathlyn Sacramento, MD   Chief Complaint  Patient presents with  . Other    12 month follow up. Patient c/o increased heart rates and pain/ dizziness when turning her neck to the left. Meds reviewed verbally with patient.       History of Present Illness: Susan [name] is a 84 y.o. female who presents for  a followup visit regarding suspected supraventricular tachycardia.She has known history of hypertension. She presented in June, 2015 with palpitations, dizziness and presyncope. She was noted by EMS to be tachycardic with a heart rate of 170 beats per minute with underlying right bundle branch block. Rhythm strips were not recorded. She converted to normal sinus rhythm without intervention. Echocardiogram showed normal LV systolic function with no significant valvular abnormalities.  30 day outpatient telemetry showed no evidence of arrhythmia.   She was diagnosed with sleep apnea last year and has been using CPAP machine since October with significant improvement in her quality of sleep.  After that, the dose of metoprolol was reduced to 25/25 and 50 mg.  She was on 50 mg 3 times daily before that.  Over the last 2 nights, she experienced episodes of tachycardia that woke her up from sleep and was associated with mild shortness of breath and dizziness.  She checked her vitals and her heart rate was 120 bpm.  She took an extra dose of metoprolol with improvement in symptoms. Outside of these episodes, she feels well with no chest pain or shortness of breath.   Past Medical History:  Diagnosis Date  . Colon polyp   . Diffuse cystic mastopathy   . Hyperlipidemia   . Hypertension   . Macular degeneration   . Tachycardia     Past Surgical History:  Procedure Laterality Date  . ABDOMINAL HYSTERECTOMY    . APPENDECTOMY    . BREAST BIOPSY  Bilateral 2006   benign, clip markers in both breasts  . COLONOSCOPY  05/03/2015  . EYE SURGERY Bilateral 2014   cataract  . salpingo oophorectmy       Current Outpatient Medications  Medication Sig Dispense Refill  . aspirin 81 MG tablet Take 81 mg by mouth daily.    Marland Kitchen atorvastatin (LIPITOR) 20 MG tablet Take 20 mg by mouth daily.    . hydrochlorothiazide (HYDRODIURIL) 12.5 MG tablet Take 12.5 mg by mouth daily.  3  . losartan (COZAAR) 100 MG tablet Take 100 mg by mouth every evening.  3  . metFORMIN (GLUCOPHAGE) 500 MG tablet Take 500 mg by mouth daily.  4  . metoprolol tartrate (LOPRESSOR) 50 MG tablet Take 50 mg by mouth 3 (three) times daily.    . Multiple Vitamins-Minerals (PRESERVISION AREDS 2) CAPS Take 1 capsule by mouth daily.    Marland Kitchen XIIDRA 5 % SOLN Place 1 drop into both eyes 2 (two) times daily.  2   No current facility-administered medications for this visit.     Allergies:   No known allergies    Social History:  The patient  reports that she has never smoked. She has never used smokeless tobacco. She reports that she does not drink alcohol or use drugs.   Family History:  The patient's family history includes Kidney cancer in her maternal aunt; Lung cancer in her father.    ROS:  Please see the history of present illness.   Otherwise, review of systems are positive for none.   All other systems are reviewed and negative.    PHYSICAL EXAM: VS:  BP 132/70 (BP Location: Left Arm, Patient Position: Sitting, Cuff Size: Normal)   Pulse 62   Ht 5\' 6"  (1.676 m)   Wt 181 lb (82.1 kg)   BMI 29.21 kg/m  , BMI Body mass index is 29.21 kg/m. GEN: Well nourished, well developed, in no acute distress  HEENT: normal  Neck: no JVD, carotid bruits, or masses Cardiac: RRR; no murmurs, rubs, or gallops,no edema  Respiratory:  clear to auscultation bilaterally, normal work of breathing GI: soft, nontender, nondistended, + BS MS: no deformity or atrophy  Skin: warm and dry, no  rash Neuro:  Strength and sensation are intact Psych: euthymic mood, full affect   EKG:  EKG is ordered today. The ekg ordered today demonstrates normal sinus rhythm with right bundle branch block and possible old inferior infarct (not new)   Recent Labs: No results found for requested labs within last 8760 hours.    Lipid Panel    Component Value Date/Time   CHOL 163 03/11/2014 0522   TRIG 333 (H) 03/11/2014 0522   HDL 44 03/11/2014 0522   VLDL 67 (H) 03/11/2014 0522   LDLCALC 52 03/11/2014 0522      Wt Readings from Last 3 Encounters:  05/26/18 181 lb (82.1 kg)  03/06/18 181 lb 6.4 oz (82.3 kg)  05/17/17 189 lb 8 oz (86 kg)        ASSESSMENT AND PLAN:  1.   Paroxysmal supraventricular tachycardia: Recent episodes of nighttime palpitations and tachycardia.  Differential diagnosis include SVT but we also have to exclude atrial fibrillation.  I recommend a 2-week outpatient monitor.  Continue treatment with metoprolol for now at the same dose instructed her that she can take an extra dose of metoprolol as needed.  If she does not respond, I instructed her to call EMS in order to document what kind of rhythm she is having.   2. Essential hypertension: Blood pressure is well controlled on current medications.    Disposition:   FU with me in 2 months  Signed,  Kathlyn Sacramento, MD  05/26/2018 11:38 AM    [name]

## 2018-05-26 NOTE — Telephone Encounter (Signed)
Call returned to the patient. She stated that for the last two nights she has been having increased heart rates and palpitations. She is currently on Metoprolol 50 mg tid. The last two nights she has taken an extra Metoprolol (total 100 mg at night) to help decrease her heart rate.   Blood pressure this morning was 139/77 and heart rate was 65. Heart rates the last few nights have been in the 120's.   Patient has an appointment today at 11:20 with Dr. Fletcher Anon.

## 2018-05-26 NOTE — Telephone Encounter (Signed)
Pt states she had to take 2 Metoprolol within an hour last night due to rapid HR. States she had a bad pain also. Pt c/o of Chest Pain: STAT if CP now or developed within 24 hours  1. Are you having CP right now? no  2. Are you experiencing any other symptoms (ex. SOB, nausea, vomiting, sweating)? Headache , States her BP this morning was 137/ (cant remember )  Tues HR was 120  3. How long have you been experiencing CP? Since Tues  4. Is your CP continuous or coming and going? Comes and goes 5. Have you taken Nitroglycerin? no ?

## 2018-06-09 DIAGNOSIS — R002 Palpitations: Secondary | ICD-10-CM | POA: Diagnosis not present

## 2018-06-21 ENCOUNTER — Telehealth: Payer: Self-pay | Admitting: *Deleted

## 2018-06-21 NOTE — Telephone Encounter (Signed)
Patient made aware of results and verbalized understanding.  The patient stated that her PCP cut back the Metoprolol to 25 mg in the morning, 25 mg in the afternoon, and 50 mg in the evening. She stated she did not feel comfortable with taking the 50 tid. She monitors her heart rate and blood pressure and stated that these are doing fine so far with her decrease. She has a follow up on 08/01/18.  She will call back if she has episodes of an increased heart rate.

## 2018-06-21 NOTE — Telephone Encounter (Signed)
-----   Message from Wellington Hampshire, MD sent at 06/19/2018  3:55 PM EDT ----- Inform patient that monitor showed only one episode of SVT that lasted for only 20 seconds.  No evidence of atrial fibrillation.  Continue same medications.

## 2018-07-26 ENCOUNTER — Encounter (INDEPENDENT_AMBULATORY_CARE_PROVIDER_SITE_OTHER): Payer: Medicare Other | Admitting: Ophthalmology

## 2018-07-26 DIAGNOSIS — D3131 Benign neoplasm of right choroid: Secondary | ICD-10-CM

## 2018-07-26 DIAGNOSIS — H353112 Nonexudative age-related macular degeneration, right eye, intermediate dry stage: Secondary | ICD-10-CM | POA: Diagnosis not present

## 2018-07-26 DIAGNOSIS — H43813 Vitreous degeneration, bilateral: Secondary | ICD-10-CM

## 2018-07-26 DIAGNOSIS — I10 Essential (primary) hypertension: Secondary | ICD-10-CM

## 2018-07-26 DIAGNOSIS — H35033 Hypertensive retinopathy, bilateral: Secondary | ICD-10-CM | POA: Diagnosis not present

## 2018-07-26 DIAGNOSIS — H353124 Nonexudative age-related macular degeneration, left eye, advanced atrophic with subfoveal involvement: Secondary | ICD-10-CM | POA: Diagnosis not present

## 2018-08-01 ENCOUNTER — Encounter: Payer: Self-pay | Admitting: Cardiovascular Disease

## 2018-08-01 ENCOUNTER — Ambulatory Visit: Payer: Medicare Other | Admitting: Cardiovascular Disease

## 2018-08-01 VITALS — BP 132/60 | HR 67 | Ht 66.0 in | Wt 183.5 lb

## 2018-08-01 DIAGNOSIS — I471 Supraventricular tachycardia: Secondary | ICD-10-CM | POA: Diagnosis not present

## 2018-08-01 DIAGNOSIS — I10 Essential (primary) hypertension: Secondary | ICD-10-CM

## 2018-08-01 DIAGNOSIS — E785 Hyperlipidemia, unspecified: Secondary | ICD-10-CM

## 2018-08-01 NOTE — Patient Instructions (Signed)

## 2018-08-01 NOTE — Progress Notes (Signed)
Cardiology Office Note   Date:  08/01/2018   ID:  Susan [name], DOB 09-Aug-1932, MRN [id number]  PCP:  Albina Billet, MD  Cardiologist:   Kathlyn Sacramento, MD   Chief Complaint  Patient presents with  . other    2 mo  follow up. Medications reviewed verbally.       History of Present Illness: Susan [name] is a 84 y.o. female who presents for  a followup visit regarding suspected supraventricular tachycardia.She has known history of hypertension. She presented in June, 2015 with palpitations, dizziness and presyncope. She was noted by EMS to be tachycardic with a heart rate of 170 beats per minute with underlying right bundle branch block. Rhythm strips were not recorded. She converted to normal sinus rhythm without intervention. Echocardiogram showed normal LV systolic function with no significant valvular abnormalities.  30 day outpatient telemetry showed no evidence of arrhythmia.  She has known history of sleep apnea on CPAP.  During her last visit, she reported worsening nighttime palpitations.  A 2-week outpatient Zio patch monitor showed sinus rhythm with average heart rate of 65 bpm.  There was one run of supraventricular tachycardia lasting 20 seconds with a maximal rate of 124 bpm.  There was no evidence of atrial fibrillation. She reports improvement in symptoms overall.  She is having issues with macular degeneration now.   Past Medical History:  Diagnosis Date  . Colon polyp   . Diffuse cystic mastopathy   . Hyperlipidemia   . Hypertension   . Macular degeneration   . Tachycardia     Past Surgical History:  Procedure Laterality Date  . ABDOMINAL HYSTERECTOMY    . APPENDECTOMY    . BREAST BIOPSY Bilateral 2006   benign, clip markers in both breasts  . COLONOSCOPY  05/03/2015  . EYE SURGERY Bilateral 2014   cataract  . salpingo oophorectmy       Current Outpatient Medications  Medication Sig Dispense Refill  . aspirin 81 MG tablet Take 81 mg by  mouth daily.    Marland Kitchen atorvastatin (LIPITOR) 20 MG tablet Take 20 mg by mouth daily.    . hydrochlorothiazide (HYDRODIURIL) 12.5 MG tablet Take 12.5 mg by mouth daily.  3  . losartan (COZAAR) 100 MG tablet Take 100 mg by mouth every evening.  3  . metFORMIN (GLUCOPHAGE) 500 MG tablet Take 500 mg by mouth daily.  4  . metoprolol tartrate (LOPRESSOR) 50 MG tablet Take 50 mg by mouth 3 (three) times daily.     . Multiple Vitamins-Minerals (PRESERVISION AREDS 2) CAPS Take 1 capsule by mouth daily.    Marland Kitchen XIIDRA 5 % SOLN Place 1 drop into both eyes 2 (two) times daily.  2   No current facility-administered medications for this visit.     Allergies:   No known allergies    Social History:  The patient  reports that she has never smoked. She has never used smokeless tobacco. She reports that she does not drink alcohol or use drugs.   Family History:  The patient's family history includes Kidney cancer in her maternal aunt; Lung cancer in her father.    ROS:  Please see the history of present illness.   Otherwise, review of systems are positive for none.   All other systems are reviewed and negative.    PHYSICAL EXAM: VS:  BP 132/60 (BP Location: Left Arm, Patient Position: Sitting, Cuff Size: Normal)   Pulse 67   Ht 5\' 6"  (  1.676 m)   Wt 183 lb 8 oz (83.2 kg)   BMI 29.62 kg/m  , BMI Body mass index is 29.62 kg/m. GEN: Well nourished, well developed, in no acute distress  HEENT: normal  Neck: no JVD, carotid bruits, or masses Cardiac: RRR; no murmurs, rubs, or gallops,no edema  Respiratory:  clear to auscultation bilaterally, normal work of breathing GI: soft, nontender, nondistended, + BS MS: no deformity or atrophy  Skin: warm and dry, no rash Neuro:  Strength and sensation are intact Psych: euthymic mood, full affect   EKG:  EKG is ordered today. The ekg ordered today demonstrates normal sinus rhythm with right bundle branch block and possible old inferior infarct (not  new)   Recent Labs: No results found for requested labs within last 8760 hours.    Lipid Panel    Component Value Date/Time   CHOL 163 03/11/2014 0522   TRIG 333 (H) 03/11/2014 0522   HDL 44 03/11/2014 0522   VLDL 67 (H) 03/11/2014 0522   LDLCALC 52 03/11/2014 0522      Wt Readings from Last 3 Encounters:  08/01/18 183 lb 8 oz (83.2 kg)  05/26/18 181 lb (82.1 kg)  03/06/18 181 lb 6.4 oz (82.3 kg)        ASSESSMENT AND PLAN:  1.   Paroxysmal supraventricular tachycardia: Recent episodes of nighttime palpitations and tachycardia.  There was no evidence of atrial fibrillation.  There was one episode of supraventricular tachycardia.  The patient's symptoms have improved without intervention.  I reassured her.  She can continue with current dose of metoprolol 25/25/50 mg.  She can take an extra dose as needed.    2. Essential hypertension: Blood pressure is well controlled on current medications.   3.  Hyperlipidemia: Currently on atorvastatin 20 mg daily.   Disposition:   FU with me in 6 months  Signed,  Kathlyn Sacramento, MD  08/01/2018 1:57 PM    [place]

## 2018-08-02 ENCOUNTER — Encounter (INDEPENDENT_AMBULATORY_CARE_PROVIDER_SITE_OTHER): Payer: Medicare Other | Admitting: Ophthalmology

## 2018-08-02 DIAGNOSIS — H35033 Hypertensive retinopathy, bilateral: Secondary | ICD-10-CM

## 2018-08-02 DIAGNOSIS — D3131 Benign neoplasm of right choroid: Secondary | ICD-10-CM | POA: Diagnosis not present

## 2018-08-02 DIAGNOSIS — H353231 Exudative age-related macular degeneration, bilateral, with active choroidal neovascularization: Secondary | ICD-10-CM | POA: Diagnosis not present

## 2018-08-02 DIAGNOSIS — H43813 Vitreous degeneration, bilateral: Secondary | ICD-10-CM | POA: Diagnosis not present

## 2018-08-02 DIAGNOSIS — I10 Essential (primary) hypertension: Secondary | ICD-10-CM

## 2018-08-29 ENCOUNTER — Encounter (INDEPENDENT_AMBULATORY_CARE_PROVIDER_SITE_OTHER): Payer: Medicare Other | Admitting: Ophthalmology

## 2018-08-29 DIAGNOSIS — H35033 Hypertensive retinopathy, bilateral: Secondary | ICD-10-CM | POA: Diagnosis not present

## 2018-08-29 DIAGNOSIS — I10 Essential (primary) hypertension: Secondary | ICD-10-CM | POA: Diagnosis not present

## 2018-08-29 DIAGNOSIS — H353231 Exudative age-related macular degeneration, bilateral, with active choroidal neovascularization: Secondary | ICD-10-CM | POA: Diagnosis not present

## 2018-08-29 DIAGNOSIS — H43813 Vitreous degeneration, bilateral: Secondary | ICD-10-CM

## 2018-08-29 DIAGNOSIS — D3131 Benign neoplasm of right choroid: Secondary | ICD-10-CM | POA: Diagnosis not present

## 2018-09-25 ENCOUNTER — Encounter (INDEPENDENT_AMBULATORY_CARE_PROVIDER_SITE_OTHER): Payer: Medicare Other | Admitting: Ophthalmology

## 2018-09-25 DIAGNOSIS — H353231 Exudative age-related macular degeneration, bilateral, with active choroidal neovascularization: Secondary | ICD-10-CM | POA: Diagnosis not present

## 2018-09-25 DIAGNOSIS — H43813 Vitreous degeneration, bilateral: Secondary | ICD-10-CM | POA: Diagnosis not present

## 2018-09-25 DIAGNOSIS — D3131 Benign neoplasm of right choroid: Secondary | ICD-10-CM | POA: Diagnosis not present

## 2018-09-25 DIAGNOSIS — H35033 Hypertensive retinopathy, bilateral: Secondary | ICD-10-CM

## 2018-09-25 DIAGNOSIS — I10 Essential (primary) hypertension: Secondary | ICD-10-CM | POA: Diagnosis not present

## 2018-11-01 ENCOUNTER — Encounter (INDEPENDENT_AMBULATORY_CARE_PROVIDER_SITE_OTHER): Payer: Medicare Other | Admitting: Ophthalmology

## 2018-11-01 DIAGNOSIS — H43813 Vitreous degeneration, bilateral: Secondary | ICD-10-CM

## 2018-11-01 DIAGNOSIS — H35033 Hypertensive retinopathy, bilateral: Secondary | ICD-10-CM

## 2018-11-01 DIAGNOSIS — H353231 Exudative age-related macular degeneration, bilateral, with active choroidal neovascularization: Secondary | ICD-10-CM | POA: Diagnosis not present

## 2018-11-01 DIAGNOSIS — I10 Essential (primary) hypertension: Secondary | ICD-10-CM

## 2018-11-01 DIAGNOSIS — D3131 Benign neoplasm of right choroid: Secondary | ICD-10-CM | POA: Diagnosis not present

## 2018-12-04 ENCOUNTER — Encounter (INDEPENDENT_AMBULATORY_CARE_PROVIDER_SITE_OTHER): Payer: Medicare Other | Admitting: Ophthalmology

## 2018-12-04 DIAGNOSIS — D3131 Benign neoplasm of right choroid: Secondary | ICD-10-CM | POA: Diagnosis not present

## 2018-12-04 DIAGNOSIS — H43813 Vitreous degeneration, bilateral: Secondary | ICD-10-CM

## 2018-12-04 DIAGNOSIS — I10 Essential (primary) hypertension: Secondary | ICD-10-CM | POA: Diagnosis not present

## 2018-12-04 DIAGNOSIS — H35033 Hypertensive retinopathy, bilateral: Secondary | ICD-10-CM

## 2018-12-04 DIAGNOSIS — H353231 Exudative age-related macular degeneration, bilateral, with active choroidal neovascularization: Secondary | ICD-10-CM

## 2018-12-04 DIAGNOSIS — H26491 Other secondary cataract, right eye: Secondary | ICD-10-CM

## 2018-12-25 ENCOUNTER — Encounter (INDEPENDENT_AMBULATORY_CARE_PROVIDER_SITE_OTHER): Payer: Medicare Other | Admitting: Ophthalmology

## 2018-12-25 ENCOUNTER — Other Ambulatory Visit: Payer: Self-pay

## 2018-12-25 DIAGNOSIS — H26491 Other secondary cataract, right eye: Secondary | ICD-10-CM

## 2019-01-01 ENCOUNTER — Other Ambulatory Visit: Payer: Self-pay

## 2019-01-01 ENCOUNTER — Encounter (INDEPENDENT_AMBULATORY_CARE_PROVIDER_SITE_OTHER): Payer: Medicare Other | Admitting: Ophthalmology

## 2019-01-01 DIAGNOSIS — I10 Essential (primary) hypertension: Secondary | ICD-10-CM | POA: Diagnosis not present

## 2019-01-01 DIAGNOSIS — H353231 Exudative age-related macular degeneration, bilateral, with active choroidal neovascularization: Secondary | ICD-10-CM | POA: Diagnosis not present

## 2019-01-01 DIAGNOSIS — H35033 Hypertensive retinopathy, bilateral: Secondary | ICD-10-CM | POA: Diagnosis not present

## 2019-01-01 DIAGNOSIS — D3131 Benign neoplasm of right choroid: Secondary | ICD-10-CM

## 2019-01-01 DIAGNOSIS — H43813 Vitreous degeneration, bilateral: Secondary | ICD-10-CM | POA: Diagnosis not present

## 2019-01-29 ENCOUNTER — Other Ambulatory Visit: Payer: Self-pay

## 2019-01-29 ENCOUNTER — Encounter (INDEPENDENT_AMBULATORY_CARE_PROVIDER_SITE_OTHER): Payer: Medicare Other | Admitting: Ophthalmology

## 2019-01-29 DIAGNOSIS — H35033 Hypertensive retinopathy, bilateral: Secondary | ICD-10-CM | POA: Diagnosis not present

## 2019-01-29 DIAGNOSIS — I10 Essential (primary) hypertension: Secondary | ICD-10-CM

## 2019-01-29 DIAGNOSIS — H43813 Vitreous degeneration, bilateral: Secondary | ICD-10-CM | POA: Diagnosis not present

## 2019-01-29 DIAGNOSIS — D3131 Benign neoplasm of right choroid: Secondary | ICD-10-CM

## 2019-01-29 DIAGNOSIS — H353231 Exudative age-related macular degeneration, bilateral, with active choroidal neovascularization: Secondary | ICD-10-CM

## 2019-03-05 ENCOUNTER — Other Ambulatory Visit: Payer: Self-pay

## 2019-03-05 ENCOUNTER — Encounter (INDEPENDENT_AMBULATORY_CARE_PROVIDER_SITE_OTHER): Payer: Medicare Other | Admitting: Ophthalmology

## 2019-03-05 DIAGNOSIS — H35033 Hypertensive retinopathy, bilateral: Secondary | ICD-10-CM | POA: Diagnosis not present

## 2019-03-05 DIAGNOSIS — I10 Essential (primary) hypertension: Secondary | ICD-10-CM | POA: Diagnosis not present

## 2019-03-05 DIAGNOSIS — D3131 Benign neoplasm of right choroid: Secondary | ICD-10-CM | POA: Diagnosis not present

## 2019-03-05 DIAGNOSIS — H353231 Exudative age-related macular degeneration, bilateral, with active choroidal neovascularization: Secondary | ICD-10-CM | POA: Diagnosis not present

## 2019-03-05 DIAGNOSIS — H43813 Vitreous degeneration, bilateral: Secondary | ICD-10-CM

## 2019-04-09 ENCOUNTER — Encounter (INDEPENDENT_AMBULATORY_CARE_PROVIDER_SITE_OTHER): Payer: Medicare Other | Admitting: Ophthalmology

## 2019-04-11 ENCOUNTER — Other Ambulatory Visit: Payer: Self-pay

## 2019-04-11 ENCOUNTER — Encounter (INDEPENDENT_AMBULATORY_CARE_PROVIDER_SITE_OTHER): Payer: Medicare Other | Admitting: Ophthalmology

## 2019-04-11 DIAGNOSIS — H353231 Exudative age-related macular degeneration, bilateral, with active choroidal neovascularization: Secondary | ICD-10-CM

## 2019-04-11 DIAGNOSIS — H35033 Hypertensive retinopathy, bilateral: Secondary | ICD-10-CM | POA: Diagnosis not present

## 2019-04-11 DIAGNOSIS — D3131 Benign neoplasm of right choroid: Secondary | ICD-10-CM | POA: Diagnosis not present

## 2019-04-11 DIAGNOSIS — H43813 Vitreous degeneration, bilateral: Secondary | ICD-10-CM

## 2019-04-11 DIAGNOSIS — I10 Essential (primary) hypertension: Secondary | ICD-10-CM

## 2019-04-16 ENCOUNTER — Other Ambulatory Visit: Payer: Self-pay | Admitting: Internal Medicine

## 2019-04-16 DIAGNOSIS — Z1231 Encounter for screening mammogram for malignant neoplasm of breast: Secondary | ICD-10-CM

## 2019-05-16 ENCOUNTER — Other Ambulatory Visit: Payer: Self-pay

## 2019-05-16 ENCOUNTER — Encounter (INDEPENDENT_AMBULATORY_CARE_PROVIDER_SITE_OTHER): Payer: Medicare Other | Admitting: Ophthalmology

## 2019-05-16 DIAGNOSIS — H35033 Hypertensive retinopathy, bilateral: Secondary | ICD-10-CM | POA: Diagnosis not present

## 2019-05-16 DIAGNOSIS — H353231 Exudative age-related macular degeneration, bilateral, with active choroidal neovascularization: Secondary | ICD-10-CM | POA: Diagnosis not present

## 2019-05-16 DIAGNOSIS — H43813 Vitreous degeneration, bilateral: Secondary | ICD-10-CM

## 2019-05-16 DIAGNOSIS — D3131 Benign neoplasm of right choroid: Secondary | ICD-10-CM | POA: Diagnosis not present

## 2019-05-16 DIAGNOSIS — I10 Essential (primary) hypertension: Secondary | ICD-10-CM | POA: Diagnosis not present

## 2019-05-18 NOTE — Progress Notes (Signed)
Cardiology Office Note    Date:  05/22/2019   ID:  Susan [name], DOB Aug 07, 1932, MRN [id number]  PCP:  Albina Billet, MD  Cardiologist:  Kathlyn Sacramento, MD  Electrophysiologist:  None   Chief Complaint: Follow up  History of Present Illness:   Susan [name] is a 85 y.o. female with history of suspected paroxysmal SVT, sleep apnea on CPAP, HTN, HLD, and macular degeneration who presents for follow up of suspected SVT.   She presented in 03/2014 with palpitations, dizziness, and presyncope and was noted to be tachycardic by EMS with heart rate of 170 bpm with underlying RBBB. Rhythm strips were not recorded at that time for cardiology review. She converted to sinus rhythm without intervention. Echo at that time showed normal LVSF with no significant valvular abnormalities. 30-day outpatient cardiac monitoring showed no evidence of arrhythmia. She was seen in 05/2018 noting worsening nocturnal palpitations with a 2-week Zio monitor showing NSR with an average heart rate of 65 bpm with one run of SVT lasting 20 seconds with a maximal rate of 124 bpm. There was no evidence of Afib. No changes were made. She was most recently seen in the office in 07/2018 and noted improvement in symptoms overall.   She comes in doing very well today.  She denies any chest pain, shortness of breath, palpitations, dizziness, presyncope, or syncope.  No lower extremity swelling, abdominal surgery, vomiting, PND, early satiety.  No falls, BRBPR, or melena.  She indicates she has not had a single palpitation since she was last seen in 07/2018.  She is taking Lopressor 25 every morning, 25 q. afternoon, and 50 every evening.  Blood pressure typically runs in the 120s over 70s.  She continues to get regular injections in her eyes for macular degeneration without adverse effect.  She does not have any issues or concerns today.  Labs: Followed by patient's PCP every 4 to 6 months per patient.  Past Medical History:   Diagnosis Date  . Colon polyp   . Diffuse cystic mastopathy   . Hyperlipidemia   . Hypertension   . Macular degeneration   . Tachycardia     Past Surgical History:  Procedure Laterality Date  . ABDOMINAL HYSTERECTOMY    . APPENDECTOMY    . BREAST BIOPSY Bilateral 2006   benign, clip markers in both breasts  . COLONOSCOPY  05/03/2015  . EYE SURGERY Bilateral 2014   cataract  . salpingo oophorectmy      Current Medications: Current Meds  Medication Sig  . aspirin 81 MG tablet Take 81 mg by mouth daily.  Marland Kitchen atorvastatin (LIPITOR) 20 MG tablet Take 20 mg by mouth daily.  . hydrochlorothiazide (HYDRODIURIL) 12.5 MG tablet Take 12.5 mg by mouth daily.  Marland Kitchen losartan (COZAAR) 100 MG tablet Take 100 mg by mouth every evening.  . metFORMIN (GLUCOPHAGE) 500 MG tablet Take 500 mg by mouth daily.  . metoprolol tartrate (LOPRESSOR) 50 MG tablet Take 1/2 tablet am 1/2 tablet evening and 50 mg at night qd.  . Multiple Vitamins-Minerals (PRESERVISION AREDS 2) CAPS Take 1 capsule by mouth daily.  Marland Kitchen XIIDRA 5 % SOLN Place 1 drop into both eyes 2 (two) times daily.    Allergies:   No known allergies   Social History   Socioeconomic History  . Marital status: Divorced    Spouse name: Not on file  . Number of children: Not on file  . Years of education: Not on file  .  Highest education level: Not on file  Occupational History  . Not on file  Social Needs  . Financial resource strain: Not on file  . Food insecurity    Worry: Not on file    Inability: Not on file  . Transportation needs    Medical: Not on file    Non-medical: Not on file  Tobacco Use  . Smoking status: Never Smoker  . Smokeless tobacco: Never Used  Substance and Sexual Activity  . Alcohol use: No  . Drug use: No  . Sexual activity: Not on file  Lifestyle  . Physical activity    Days per week: Not on file    Minutes per session: Not on file  . Stress: Not on file  Relationships  . Social Herbalist  on phone: Not on file    Gets together: Not on file    Attends religious service: Not on file    Active member of club or organization: Not on file    Attends meetings of clubs or organizations: Not on file    Relationship status: Not on file  Other Topics Concern  . Not on file  Social History Narrative  . Not on file     Family History:  The patient's family history includes Kidney cancer in her maternal aunt; Lung cancer in her father. There is no history of Breast cancer.  ROS:   Review of Systems  Constitutional: Negative for chills, diaphoresis, fever, malaise/fatigue and weight loss.  HENT: Negative for congestion.   Eyes: Negative for discharge and redness.  Respiratory: Negative for cough, hemoptysis, sputum production, shortness of breath and wheezing.   Cardiovascular: Negative for chest pain, palpitations, orthopnea, claudication, leg swelling and PND.  Gastrointestinal: Negative for abdominal pain, blood in stool, heartburn, melena, nausea and vomiting.  Genitourinary: Negative for hematuria.  Musculoskeletal: Negative for falls and myalgias.  Skin: Negative for rash.  Neurological: Negative for dizziness, tingling, tremors, sensory change, speech change, focal weakness, loss of consciousness and weakness.  Endo/Heme/Allergies: Does not bruise/bleed easily.  Psychiatric/Behavioral: Negative for substance abuse. The patient is not nervous/anxious.   All other systems reviewed and are negative.    EKGs/Labs/Other Studies Reviewed:    Studies reviewed were summarized above. The additional studies were reviewed today:  Zio 03/2018: Normal sinus rhythm with an average heart rate of 65 bpm. 1 run of supraventricular tachycardia lasting 20 seconds with a maximal rate of 124 bpm.  This happened at 2:53 AM. No evidence of atrial fibrillation. __________  2D Echo 03/2014: EF 55-60%, mild concentric LVH, Gr1DD, trivial pericardial effusion, mid to moderate aortic valve  sclerosis without stenosis   EKG:  EKG is ordered today.  The EKG ordered today demonstrates NSR, 79 bpm, RBBB (unchanged from prior)  Recent Labs: No results found for requested labs within last 8760 hours.  Recent Lipid Panel    Component Value Date/Time   CHOL 163 03/11/2014 0522   TRIG 333 (H) 03/11/2014 0522   HDL 44 03/11/2014 0522   VLDL 67 (H) 03/11/2014 0522   LDLCALC 52 03/11/2014 0522    PHYSICAL EXAM:    VS:  BP 128/78 (BP Location: Left Arm, Patient Position: Sitting, Cuff Size: Normal)   Pulse 71   Ht 5\' 5"  (1.651 m)   Wt 186 lb (84.4 kg)   BMI 30.95 kg/m   BMI: Body mass index is 30.95 kg/m.  Physical Exam  Constitutional: She is oriented to person, place, and time.  She appears well-developed and well-nourished.  HENT:  Head: Normocephalic and atraumatic.  Eyes: Right eye exhibits no discharge. Left eye exhibits no discharge.  Neck: Normal range of motion. No JVD present.  Cardiovascular: Normal rate, regular rhythm, S1 normal, S2 normal and normal heart sounds. Exam reveals no distant heart sounds, no friction rub, no midsystolic click and no opening snap.  No murmur heard. Pulses:      Posterior tibial pulses are 2+ on the right side and 2+ on the left side.  Pulmonary/Chest: Effort normal and breath sounds normal. No respiratory distress. She has no decreased breath sounds. She has no wheezes. She has no rales. She exhibits no tenderness.  Abdominal: Soft. She exhibits no distension. There is no abdominal tenderness.  Musculoskeletal:        General: Edema present.     Comments: Trace bilateral ankle edema  Neurological: She is alert and oriented to person, place, and time.  Skin: Skin is warm and dry. No cyanosis. Nails show no clubbing.  Psychiatric: She has a normal mood and affect. Her speech is normal and behavior is normal. Judgment and thought content normal.    Wt Readings from Last 3 Encounters:  05/22/19 186 lb (84.4 kg)  08/01/18 183 lb 8  oz (83.2 kg)  05/26/18 181 lb (82.1 kg)     ASSESSMENT & PLAN:   1. Paroxysmal SVT: Patient has done remarkably well over the past year and has not had any episodes of symptomatic palpitations.  Continue current regimen of metoprolol tartrate 25 mill milligrams each morning, 25 mg each afternoon, and 50 mg each evening.  Labs are followed by PCP.  2. HTN: Blood pressure is well controlled today.  Continue current regimen including HCTZ, Cozaar, and Lopressor.  3. HLD: Remains on Lipitor 20 mg daily.  LDL is followed by PCP.  Disposition: F/u with Dr. Fletcher Anon or an APP in 12 months, sooner if needed.   Medication Adjustments/Labs and Tests Ordered: Current medicines are reviewed at length with the patient today.  Concerns regarding medicines are outlined above. Medication changes, Labs and Tests ordered today are summarized above and listed in the Patient Instructions accessible in Encounters.   Signed, Christell Faith, PA-C 05/22/2019 2:39 PM     [facility] [address] [place], [place] [address] [phone]

## 2019-05-22 ENCOUNTER — Ambulatory Visit: Payer: Medicare Other | Admitting: Physician Assistant

## 2019-05-22 ENCOUNTER — Encounter: Payer: Self-pay | Admitting: Physician Assistant

## 2019-05-22 ENCOUNTER — Other Ambulatory Visit: Payer: Self-pay

## 2019-05-22 VITALS — BP 128/78 | HR 71 | Ht 65.0 in | Wt 186.0 lb

## 2019-05-22 DIAGNOSIS — E785 Hyperlipidemia, unspecified: Secondary | ICD-10-CM | POA: Diagnosis not present

## 2019-05-22 DIAGNOSIS — I471 Supraventricular tachycardia: Secondary | ICD-10-CM | POA: Diagnosis not present

## 2019-05-22 DIAGNOSIS — I10 Essential (primary) hypertension: Secondary | ICD-10-CM

## 2019-05-22 NOTE — Patient Instructions (Signed)
Medication Instructions:  Your physician recommends that you continue on your current medications as directed. Please refer to the Current Medication list given to you today.  If you need a refill on your cardiac medications before your next appointment, please call your pharmacy.   Lab work: None ordered  If you have labs (blood work) drawn today and your tests are completely normal, you will receive your results only by: Marland Kitchen MyChart Message (if you have MyChart) OR . A paper copy in the mail If you have any lab test that is abnormal or we need to change your treatment, we will call you to review the results.  Testing/Procedures: None ordered   Follow-Up: At [facility], you and your health needs are our priority.  As part of our continuing mission to provide you with exceptional heart care, we have created designated Provider Care Teams.  These Care Teams include your primary Cardiologist (physician) and Advanced Practice Providers (APPs -  Physician Assistants and Nurse Practitioners) who all work together to provide you with the care you need, when you need it. You will need a follow up appointment in 12 months.  Please see Kathlyn Sacramento, MD.

## 2019-05-24 NOTE — Addendum Note (Signed)
Addended by: Britt Bottom on: 05/24/2019 07:25 AM   Modules accepted: Orders

## 2019-05-28 ENCOUNTER — Ambulatory Visit
Admission: RE | Admit: 2019-05-28 | Discharge: 2019-05-28 | Disposition: A | Discharge status: Home / Self Care | Payer: Medicare Other | Source: Ambulatory Visit | Attending: Internal Medicine | Admitting: Internal Medicine

## 2019-05-28 DIAGNOSIS — Z1231 Encounter for screening mammogram for malignant neoplasm of breast: Secondary | ICD-10-CM | POA: Insufficient documentation

## 2019-06-20 ENCOUNTER — Encounter (INDEPENDENT_AMBULATORY_CARE_PROVIDER_SITE_OTHER): Payer: Medicare Other | Admitting: Ophthalmology

## 2019-07-30 ENCOUNTER — Encounter (INDEPENDENT_AMBULATORY_CARE_PROVIDER_SITE_OTHER): Payer: Medicare Other | Admitting: Ophthalmology

## 2019-12-22 IMAGING — MG MM DIGITAL SCREENING BILAT W/ TOMO W/ CAD
8 series · 8 of 24 positions shown · non-contrast
Comparison: Previous exam(s).

CLINICAL DATA: Screening.

EXAM:
DIGITAL SCREENING BILATERAL MAMMOGRAM WITH TOMO AND CAD

[R MLO synth-2D]
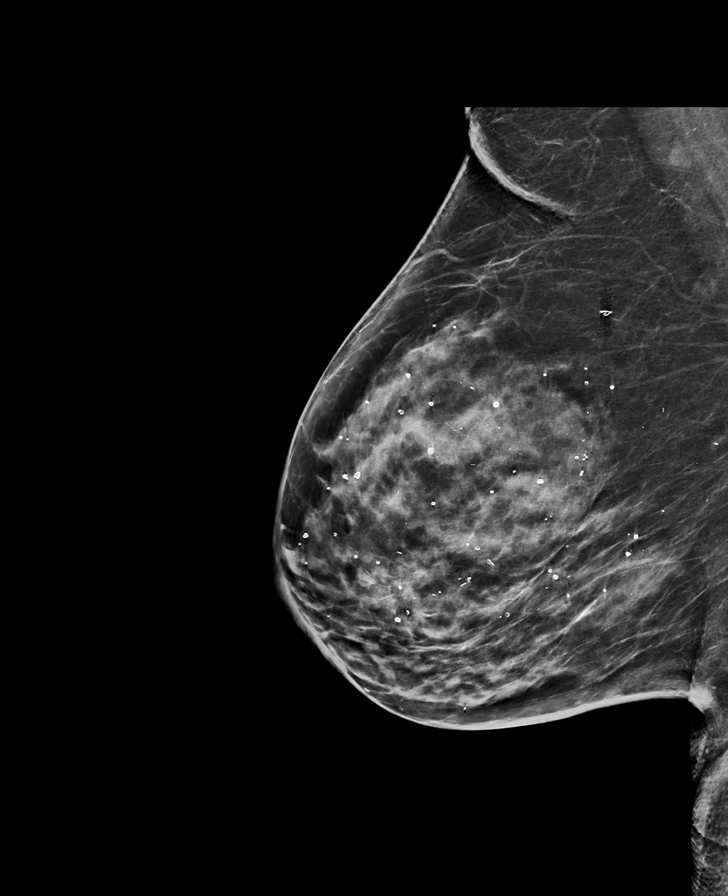

[L MLO synth-2D]
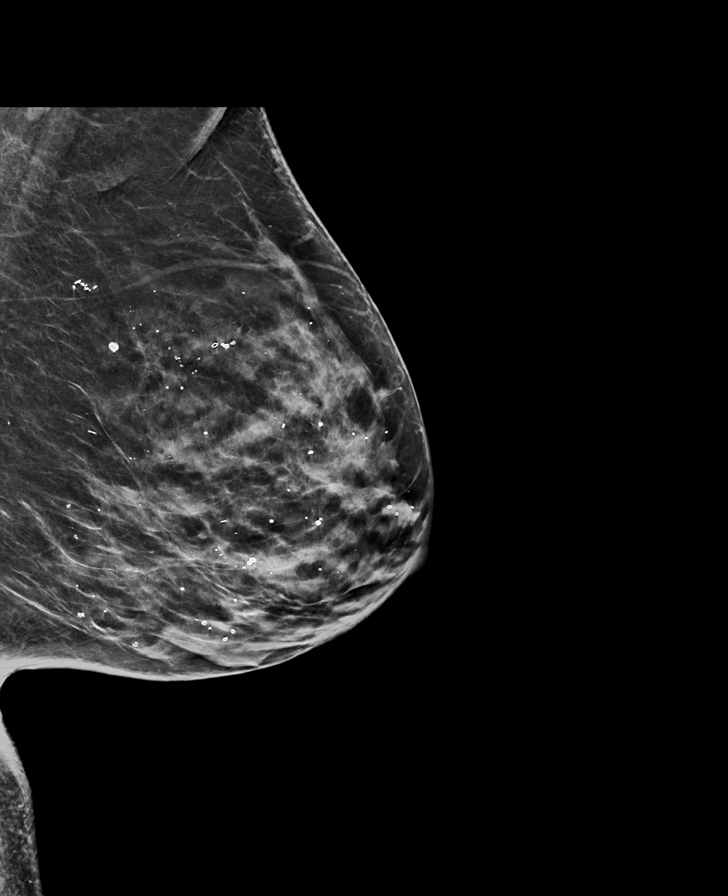

[R CC synth-2D]
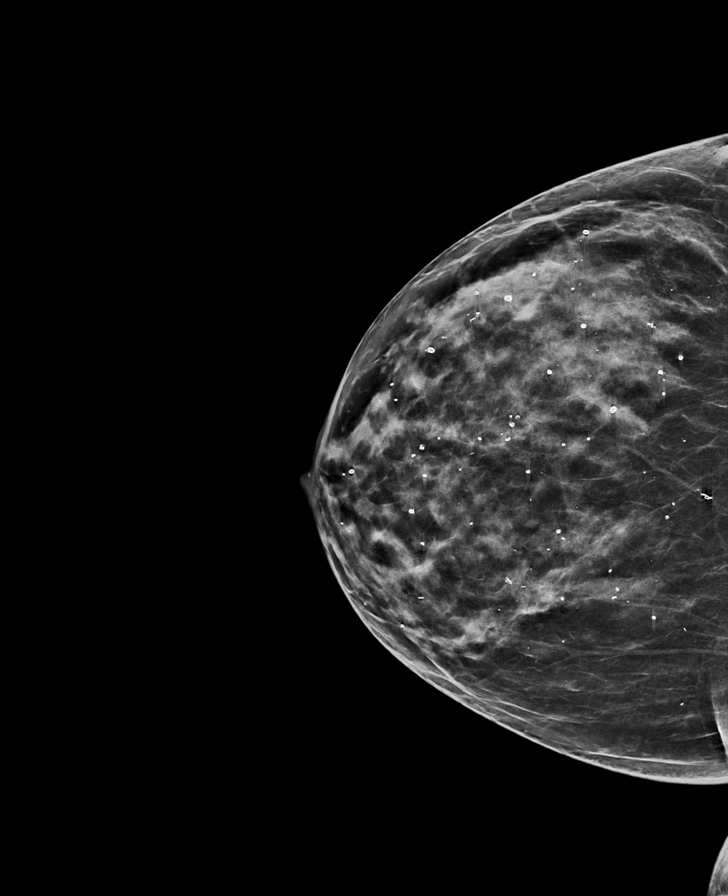

[L CC synth-2D]
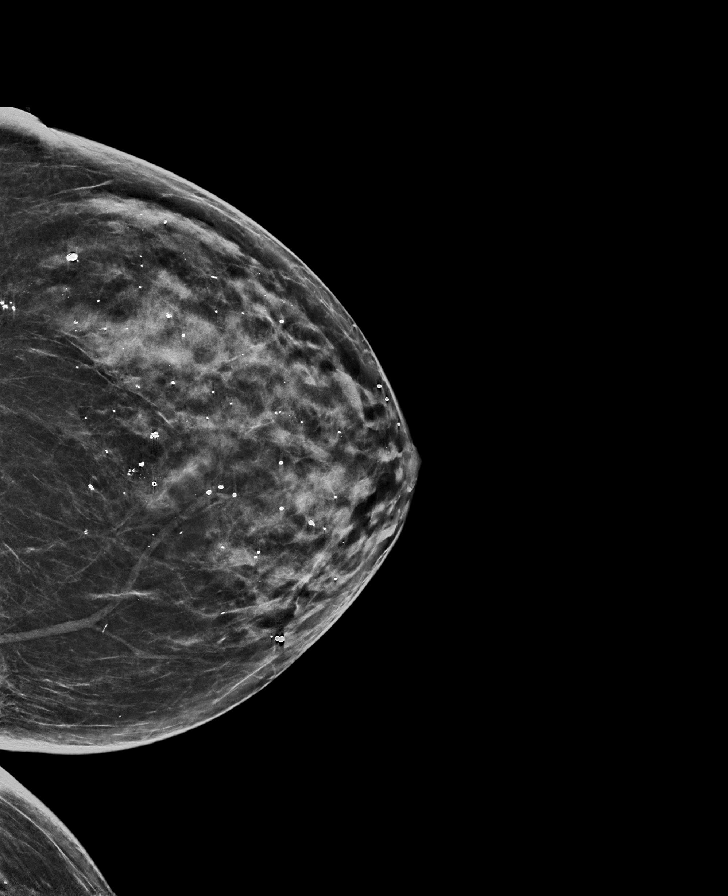

[R MLO tomo · tomo slice 38/75.0]
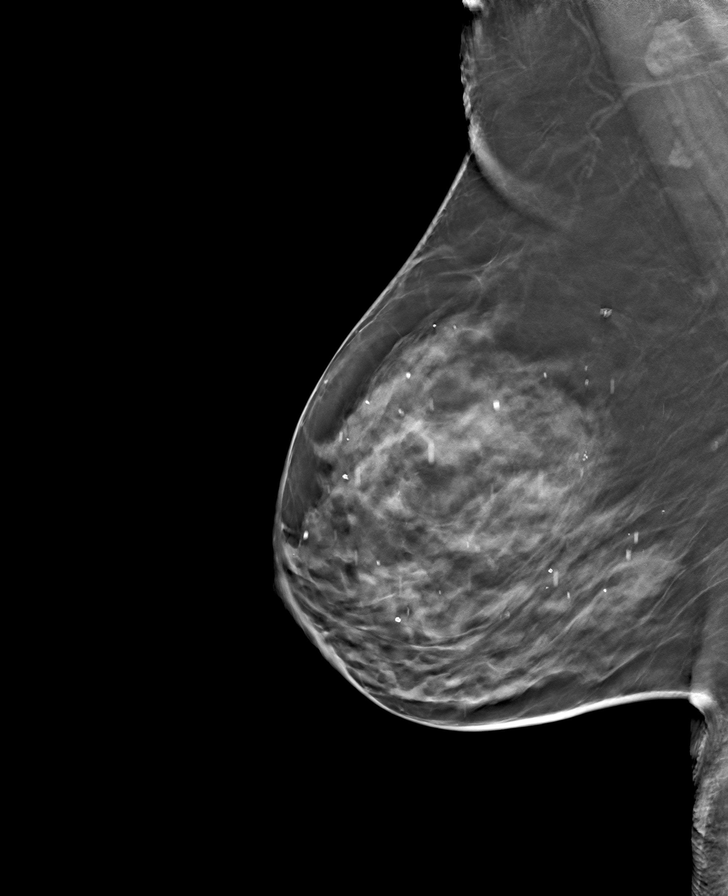

[L MLO tomo · tomo slice 33/66.0]
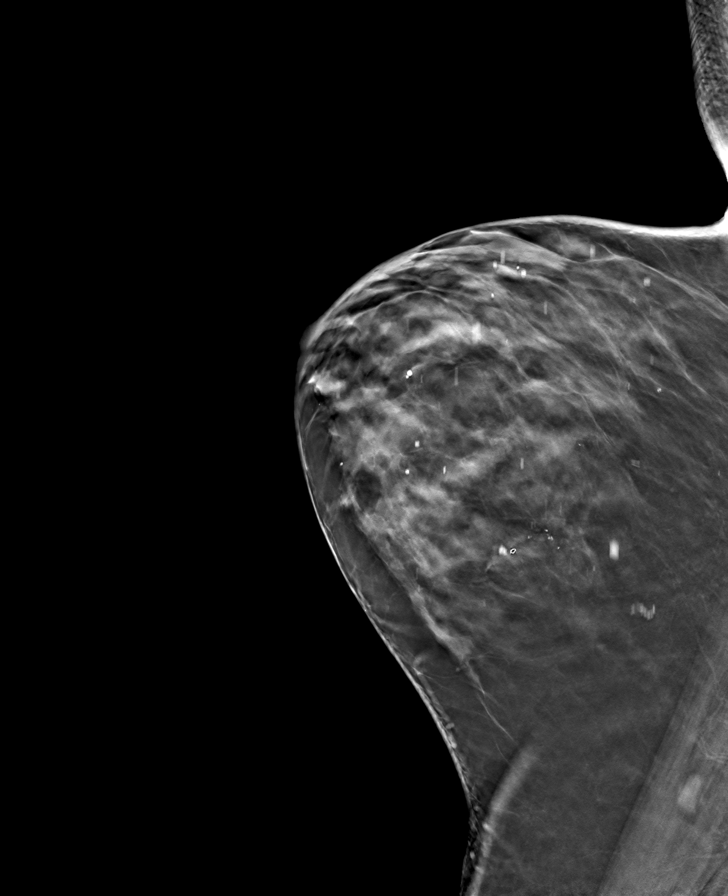

[R CC tomo · tomo slice 33/65.0]
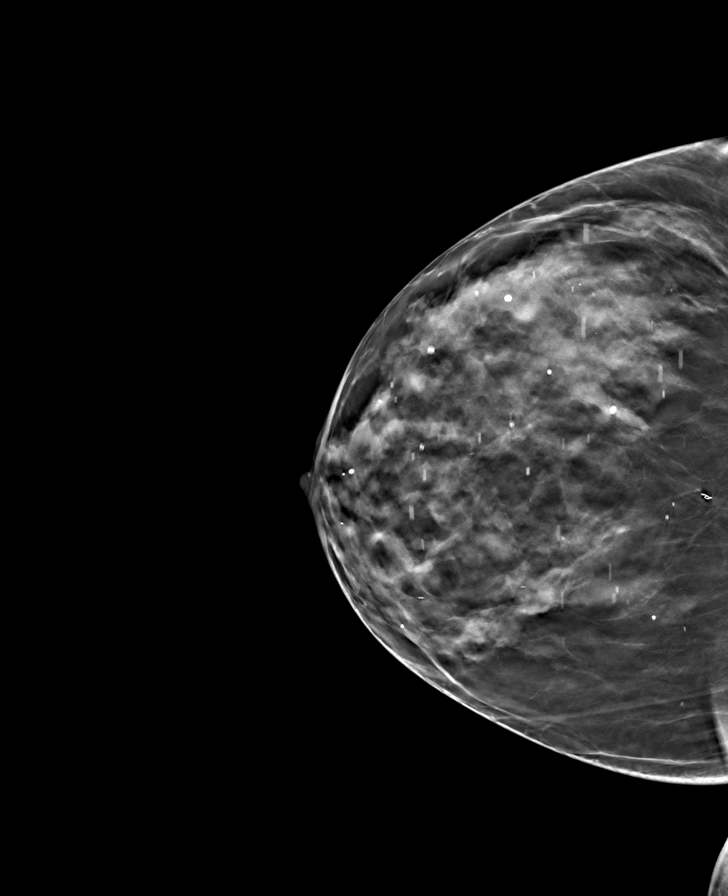

[L CC tomo · tomo slice 33/64.0]
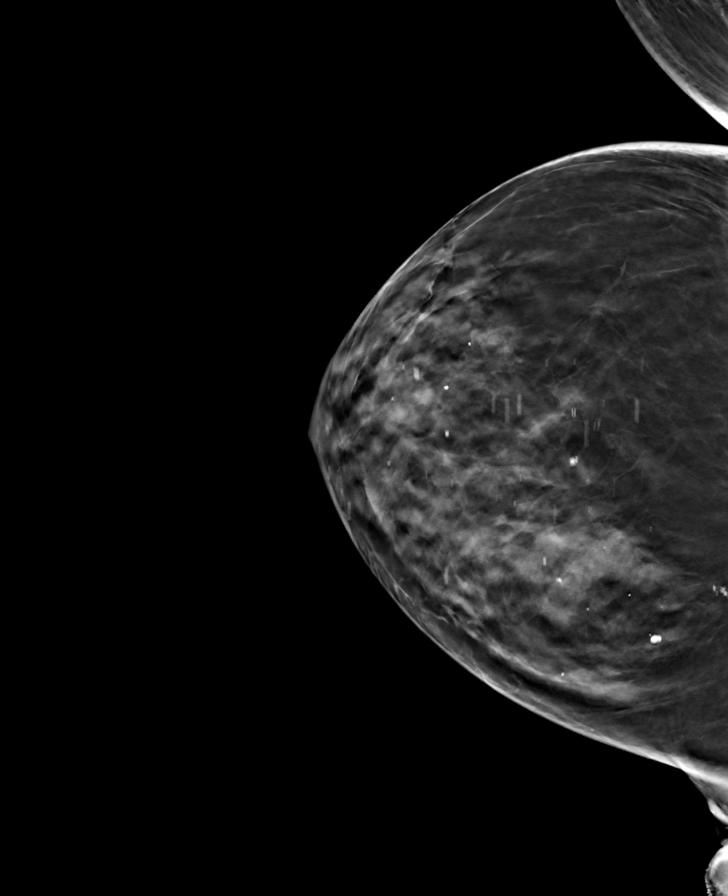

[8 of 24 positions shown; findings below may reference images not displayed]

ACR Breast Density Category c: The breast tissue is heterogeneously
dense, which may obscure small masses.
FINDINGS: There are no findings suspicious for malignancy. Images were
processed with CAD.
IMPRESSION: No mammographic evidence of malignancy. A result letter of this
screening mammogram will be mailed directly to the patient.

RECOMMENDATION:
Screening mammogram in one year. (Code:FT-U-LHB)

BI-RADS CATEGORY  1: Negative.

## 2020-04-21 ENCOUNTER — Other Ambulatory Visit: Payer: Self-pay | Admitting: Internal Medicine

## 2020-04-21 DIAGNOSIS — Z1231 Encounter for screening mammogram for malignant neoplasm of breast: Secondary | ICD-10-CM

## 2020-05-28 ENCOUNTER — Other Ambulatory Visit: Payer: Self-pay

## 2020-05-28 ENCOUNTER — Ambulatory Visit
Admission: RE | Admit: 2020-05-28 | Discharge: 2020-05-28 | Disposition: A | Discharge status: Home / Self Care | Payer: Medicare Other | Source: Ambulatory Visit | Attending: Internal Medicine | Admitting: Internal Medicine

## 2020-05-28 DIAGNOSIS — Z1231 Encounter for screening mammogram for malignant neoplasm of breast: Secondary | ICD-10-CM | POA: Insufficient documentation

## 2020-08-04 ENCOUNTER — Encounter: Payer: Self-pay | Admitting: Family

## 2020-08-04 ENCOUNTER — Other Ambulatory Visit: Payer: Self-pay

## 2020-08-04 ENCOUNTER — Ambulatory Visit: Payer: Medicare Other | Admitting: Family

## 2020-08-04 VITALS — BP 124/68 | HR 66 | Ht 66.0 in | Wt 170.0 lb

## 2020-08-04 DIAGNOSIS — E782 Mixed hyperlipidemia: Secondary | ICD-10-CM | POA: Diagnosis not present

## 2020-08-04 DIAGNOSIS — I471 Supraventricular tachycardia: Secondary | ICD-10-CM | POA: Diagnosis not present

## 2020-08-04 DIAGNOSIS — I493 Ventricular premature depolarization: Secondary | ICD-10-CM | POA: Diagnosis not present

## 2020-08-04 DIAGNOSIS — I10 Essential (primary) hypertension: Secondary | ICD-10-CM | POA: Diagnosis not present

## 2020-08-04 DIAGNOSIS — G4733 Obstructive sleep apnea (adult) (pediatric): Secondary | ICD-10-CM

## 2020-08-04 NOTE — Patient Instructions (Addendum)
Medication Instructions:  Your physician recommends that you continue on your current medications as directed. Please refer to the Current Medication list given to you today.  *If you need a refill on your cardiac medications before your next appointment, please call your pharmacy*   Lab Work: None ordered If you have labs (blood work) drawn today and your tests are completely normal, you will receive your results only by: Marland Kitchen MyChart Message (if you have MyChart) OR . A paper copy in the mail If you have any lab test that is abnormal or we need to change your treatment, we will call you to review the results.   Testing/Procedures: Your EKG today showed an occasional early beat called a PVC (premature ventricular contraction). These are common and not dangerous. Sometimes it can feel as if your heart "jumped" or "skipped a beat". Your Metoprolol will help prevent these from happening more often. If you notice you are having more symptoms, don't hesitate to let us know.    Follow-Up: At [facility], you and your health needs are our priority.  As part of our continuing mission to provide you with exceptional heart care, we have created designated Provider Care Teams.  These Care Teams include your primary Cardiologist (physician) and Advanced Practice Providers (APPs -  Physician Assistants and Nurse Practitioners) who all work together to provide you with the care you need, when you need it.  We recommend signing up for the patient portal called "MyChart".  Sign up information is provided on this After Visit Summary.  MyChart is used to connect with patients for Virtual Visits (Telemedicine).  Patients are able to view lab/test results, encounter notes, upcoming appointments, etc.  Non-urgent messages can be sent to your provider as well.   To learn more about what you can do with MyChart, go to [email or web address].    Your next appointment:   Your physician wants you to follow-up in:  12 months You will receive a reminder letter in the mail two months in advance. If you don't receive a letter, please call our office to schedule the follow-up appointment.   The format for your next appointment:   In Person  Provider:   You may see Kathlyn Sacramento, MD or one of the following Advanced Practice Providers on your designated Care Team:    Murray Hodgkins, NP  Christell Faith, PA-C  Marrianne Mood, PA-C  Cadence Kathlen Mody, [place]    Other Instructions N/A

## 2020-08-04 NOTE — Progress Notes (Signed)
Office Visit    Patient Name: Susan [name] Date of Encounter: 08/04/2020  Primary Care Provider:  Albina Billet, MD Primary Cardiologist:  Kathlyn Sacramento, MD Electrophysiologist:  None   Chief Complaint    Susan [name] is a 86 y.o. female with a hx of suspected paroxysmal SVT, sleep apnea on CPAP, HTN, HLD, macular degeneration presents today for follow up of suspected SVT.   Past Medical History    Past Medical History:  Diagnosis Date  . Colon polyp   . Diffuse cystic mastopathy   . Hyperlipidemia   . Hypertension   . Macular degeneration   . Tachycardia    Past Surgical History:  Procedure Laterality Date  . ABDOMINAL HYSTERECTOMY    . APPENDECTOMY    . BREAST BIOPSY Bilateral 2006   benign, clip markers in both breasts  . COLONOSCOPY  05/03/2015  . EYE SURGERY Bilateral 2014   cataract  . salpingo oophorectmy      Allergies  Allergies  Allergen Reactions  . No Known Allergies     History of Present Illness    Susan [name] is a 86 y.o. female with a hx of suspected paroxysmal SVT, sleep apnea on CPAP, HTN, HLD, DM2, macular degeneration last seen 05/22/19 by Christell Faith, PA.  Presented 30-Mar-2014 with palpitations, dizziness, presyncope and was tachycardic by EMS with heart rate 170bpm with underlying RBBB. Rhythm strips were not recorded for cardiology review. She converted to NSR without intervention. Echo at that time with normal LVSF with no significant valvular abnormalities. 30-day outpatient monitor with no evidence of arrhythmia. She was seen in 05/2018 noting worsening nocturnal palpitations with 2-week ZIO monitor showing NSR with average HR of 65 bpm with one run of SVT lasting 20 seconds with maximal rate of 124 bpm. No evidence of atrial fibrillation. No changes were made. Seen in office 07/2018 and noted improvement in symptoms.   Last seen 05/2019 and was doing well. She was taking Lopressor 25mg  in the morning, 25mg  in the afternoon, and  50mg  in the evening.   Presents today for follow up. She reports no chest pain, pressure, tightness. She reports occasional orthostasis symptoms. She drinks decaffeinated coffee and tea as well as water.  She has been working very hard on weight loss and is down 17 pounds from her clinic visit 1 year ago.  Tells me she has been working hard on portion control, avoiding carbohydrates.  She is also trying to be active.  She continues to do all of her own housework and live independently.  We reviewed her EKG showing PVCs.  She denies palpitations other than "an occasional flutter "which is not bothersome.  EKGs/Labs/Other Studies Reviewed:   The following studies were reviewed today: Zio 2018-03-30: Normal sinus rhythm with an average heart rate of 65 bpm. 1 run of supraventricular tachycardia lasting 20 seconds with a maximal rate of 124 bpm.  This happened at 2:53 AM. No evidence of atrial fibrillation. __________   2D Echo 2014/03/30: EF 55-60%, mild concentric LVH, Gr1DD, trivial pericardial effusion, mid to moderate aortic valve sclerosis without stenosis  EKG:  EKG is ordered today.  The ekg ordered today demonstrates sinus rhythm 66 bpm with known right bundle branch block, stable T wave inversion in lateral leads and PVCs.  Recent Labs: No results found for requested labs within last 8760 hours.  Recent Lipid Panel    Component Value Date/Time   CHOL 163 03/11/2014 0522   TRIG 333 (  H) 03/11/2014 0522   HDL 44 03/11/2014 0522   VLDL 67 (H) 03/11/2014 0522   LDLCALC 52 03/11/2014 0522    Home Medications   Current Meds  Medication Sig  . aspirin 81 MG tablet Take 81 mg by mouth daily.  Marland Kitchen atorvastatin (LIPITOR) 20 MG tablet Take 20 mg by mouth daily.  . Glycerin-Hypromellose-PEG 400 (VISINE DRY EYE OP) Apply to eye as needed.  . hydrochlorothiazide (HYDRODIURIL) 12.5 MG tablet Take 12.5 mg by mouth daily.  Marland Kitchen losartan (COZAAR) 100 MG tablet Take 100 mg by mouth every evening.  .  metFORMIN (GLUCOPHAGE) 500 MG tablet 1000mg  in AM and 500mg  in PM  . metoprolol tartrate (LOPRESSOR) 50 MG tablet Take 1/2 tablet am 1/2 tablet evening and 50 mg at night qd.  . Multiple Vitamins-Minerals (PRESERVISION AREDS 2) CAPS Take 1 capsule by mouth daily.    Review of Systems   Review of Systems  Constitutional: Negative for chills, fever and malaise/fatigue.  Cardiovascular: Negative for chest pain, dyspnea on exertion, leg swelling, near-syncope, orthopnea, palpitations and syncope.  Respiratory: Negative for cough, shortness of breath and wheezing.   Gastrointestinal: Negative for nausea and vomiting.  Neurological: Negative for dizziness, light-headedness and weakness.   All other systems reviewed and are otherwise negative except as noted above.  Physical Exam    VS:  BP 124/68 (BP Location: Left Arm, Patient Position: Sitting, Cuff Size: Normal)   Pulse 66   Ht 5\' 6"  (1.676 m)   Wt 170 lb (77.1 kg)   SpO2 97%   BMI 27.44 kg/m  , BMI Body mass index is 27.44 kg/m.  Wt Readings from Last 3 Encounters:  08/04/20 170 lb (77.1 kg)  05/22/19 186 lb (84.4 kg)  08/01/18 183 lb 8 oz (83.2 kg)    GEN: Well nourished, well developed, in no acute distress. HEENT: normal. Neck: Supple, no JVD, carotid bruits, or masses. Cardiac: RRR, no murmurs, rubs, or gallops. No clubbing, cyanosis, edema.  Radials/DP/PT 2+ and equal bilaterally.  Respiratory:  Respirations regular and unlabored, clear to auscultation bilaterally. GI: Soft, nontender, nondistended. MS: No deformity or atrophy. Skin: Warm and dry, no rash. Neuro:  Strength and sensation are intact. Psych: Normal affect.  Assessment & Plan    1. Paroxysmal SVT / PVC - Reports occasional palpitations which is not bothersome. Her EKG today shows NSR with occasional PVC. Continue present dose of Metoprolol. She was educated to report any worsening palpitations.  If so, could consider increasing dose of metoprolol.  As she  is asymptomatic today we will defer.  2. HTN - BP well controlled. Continue current antihypertensive regimen including HCTZ 12.5 mg daily, losartan 100 mg daily.  3. HLD - Lipid panel 03/21/20 with total cholesterol 138, triglycerides 111, HDL 45, LDL 73. Continue Atorvastatin 20mg  per PCP.   4. OSA - Reports compliance with CPAP.   Disposition: Follow up in 1 year(s) with Dr. Fletcher Anon or APP   Signed, Loel Dubonnet, NP 08/04/2020, 11:37 AM [place]

## 2021-05-25 ENCOUNTER — Other Ambulatory Visit: Payer: Self-pay | Admitting: Internal Medicine

## 2021-05-25 DIAGNOSIS — Z1231 Encounter for screening mammogram for malignant neoplasm of breast: Secondary | ICD-10-CM

## 2021-05-27 ENCOUNTER — Encounter: Payer: Self-pay | Admitting: Emergency Medicine

## 2021-05-27 ENCOUNTER — Emergency Department: Payer: Medicare Other

## 2021-05-27 ENCOUNTER — Other Ambulatory Visit: Payer: Self-pay

## 2021-05-27 ENCOUNTER — Emergency Department
Admission: EM | Admit: 2021-05-27 | Discharge: 2021-05-27 | Disposition: A | Discharge status: Home / Self Care | Payer: Medicare Other | Attending: Emergency Medicine | Admitting: Emergency Medicine

## 2021-05-27 DIAGNOSIS — Z79899 Other long term (current) drug therapy: Secondary | ICD-10-CM | POA: Insufficient documentation

## 2021-05-27 DIAGNOSIS — M546 Pain in thoracic spine: Secondary | ICD-10-CM | POA: Diagnosis present

## 2021-05-27 DIAGNOSIS — N2889 Other specified disorders of kidney and ureter: Secondary | ICD-10-CM | POA: Diagnosis not present

## 2021-05-27 DIAGNOSIS — R918 Other nonspecific abnormal finding of lung field: Secondary | ICD-10-CM

## 2021-05-27 DIAGNOSIS — R911 Solitary pulmonary nodule: Secondary | ICD-10-CM | POA: Insufficient documentation

## 2021-05-27 DIAGNOSIS — Z7982 Long term (current) use of aspirin: Secondary | ICD-10-CM | POA: Insufficient documentation

## 2021-05-27 DIAGNOSIS — R0781 Pleurodynia: Secondary | ICD-10-CM | POA: Diagnosis not present

## 2021-05-27 DIAGNOSIS — I10 Essential (primary) hypertension: Secondary | ICD-10-CM | POA: Diagnosis not present

## 2021-05-27 DIAGNOSIS — Z86010 Personal history of colonic polyps: Secondary | ICD-10-CM | POA: Insufficient documentation

## 2021-05-27 DIAGNOSIS — Z7984 Long term (current) use of oral hypoglycemic drugs: Secondary | ICD-10-CM | POA: Insufficient documentation

## 2021-05-27 LAB — CBC
HCT: 36.4 % (ref 36.0–46.0)
Hemoglobin: 12.2 g/dL (ref 12.0–15.0)
MCH: 30.2 pg (ref 26.0–34.0)
MCHC: 33.5 g/dL (ref 30.0–36.0)
MCV: 90.1 fL (ref 80.0–100.0)
Platelets: 223 10*3/uL (ref 150–400)
RBC: 4.04 MIL/uL (ref 3.87–5.11)
RDW: 12.9 % (ref 11.5–15.5)
WBC: 6.9 10*3/uL (ref 4.0–10.5)
nRBC: 0 % (ref 0.0–0.2)

## 2021-05-27 LAB — BASIC METABOLIC PANEL
Anion gap: 9 (ref 5–15)
BUN: 16 mg/dL (ref 8–23)
CO2: 26 mmol/L (ref 22–32)
Calcium: 8.8 mg/dL — ABNORMAL LOW (ref 8.9–10.3)
Chloride: 104 mmol/L (ref 98–111)
Creatinine, Ser: 0.54 mg/dL (ref 0.44–1.00)
GFR, Estimated: >60 mL/min (ref >60)
Glucose, Bld: 109 mg/dL — ABNORMAL HIGH (ref 70–99)
Potassium: 3.9 mmol/L (ref 3.5–5.1)
Sodium: 139 mmol/L (ref 135–145)

## 2021-05-27 LAB — TROPONIN I (HIGH SENSITIVITY): Troponin I (High Sensitivity): 5 ng/L (ref <18)

## 2021-05-27 MED ORDER — IOHEXOL 350 MG/ML SOLN
100.0000 mL | Freq: Once | INTRAVENOUS | Status: AC | PRN
  Administered 2021-05-27: 100 mL via INTRAVENOUS

## 2021-05-27 MED ORDER — ACETAMINOPHEN 500 MG PO TABS
1000.0000 mg | ORAL_TABLET | Freq: Once | ORAL | Status: AC
  Administered 2021-05-27: 1000 mg via ORAL
  Filled 2021-05-27: qty 2

## 2021-05-27 NOTE — ED Notes (Signed)
Patient transported to CT 

## 2021-05-27 NOTE — ED Triage Notes (Signed)
Pt comes ems from home with dizzy spells since Sunday. EKG showed RBBB. Pain between shoulder blades since Sunday and right rib pain. Exertion makes it worse. Aox4. CBG 126. 139/82.

## 2021-05-28 NOTE — ED Provider Notes (Signed)
[facility]  ____________________________________________   Event Date/Time   First MD Initiated Contact with Patient 05/27/21 1734     (approximate)  I have reviewed the triage vital signs and the nursing notes.   HISTORY  Chief Complaint Dizziness    HPI Susan [name] is a 87 y.o. female with past medical history of hyperlipidemia, hypertension, macular degeneration who presents with back pain and lightheadedness.  Symptoms started 3 days ago.  Patient endorses pain in her mid thoracic back.  It was intermittent now is constant.  It is worse with walking around and movement.  Patient is concerned because her mother had similar symptoms times and died of an aortic dissection.  She denies any chest pain or dyspnea.  Does feel lightheaded upon standing but denies any dizziness.  Denies visual change, numbness weakness or abdominal pain.  No fevers or chills         Past Medical History:  Diagnosis Date   Colon polyp    Diffuse cystic mastopathy    Hyperlipidemia    Hypertension    Macular degeneration    Tachycardia     Patient Active Problem List   Diagnosis Date Noted   Skin abrasion 03/06/2018   Asymptomatic varicose veins of both lower extremities 03/06/2018   Bilateral lower extremity edema 03/06/2018   PSVT (paroxysmal supraventricular tachycardia) ([place]) 11/07/2014   Tachycardia 03/15/2014   Hypertension    Nipple discharge 03/22/2013   Fibrocystic breast 03/22/2013   Personal history of colonic polyps 03/22/2013    Past Surgical History:  Procedure Laterality Date   ABDOMINAL HYSTERECTOMY     APPENDECTOMY     BREAST BIOPSY Bilateral 2006   benign, clip markers in both breasts   COLONOSCOPY  05/03/2015   EYE SURGERY Bilateral 2014   cataract   salpingo oophorectmy      Prior to Admission medications   Medication Sig Start Date End Date Taking? Authorizing Provider  aspirin 81 MG tablet Take 81 mg by mouth daily.     [provider]  atorvastatin (LIPITOR) 20 MG tablet Take 20 mg by mouth daily.    [provider]  Glycerin-Hypromellose-PEG 400 (VISINE DRY EYE OP) Apply to eye as needed.    [provider]  hydrochlorothiazide (HYDRODIURIL) 12.5 MG tablet Take 12.5 mg by mouth daily. 05/04/17   [provider]  losartan (COZAAR) 100 MG tablet Take 100 mg by mouth every evening. 05/04/17   [provider]  metFORMIN (GLUCOPHAGE) 500 MG tablet '1000mg'$  in AM and '500mg'$  in PM 02/02/18   [provider]  metoprolol tartrate (LOPRESSOR) 50 MG tablet Take 1/2 tablet am 1/2 tablet evening and 50 mg at night qd.    [provider]  Multiple Vitamins-Minerals (PRESERVISION AREDS 2) CAPS Take 1 capsule by mouth daily.    [provider]    Allergies No known allergies  Family History  Problem Relation Age of Onset   Lung cancer Father    Kidney cancer Maternal Aunt    Breast cancer Neg Hx     Social History Social History   Tobacco Use   Smoking status: Never   Smokeless tobacco: Never  Vaping Use   Vaping Use: Never used  Substance Use Topics   Alcohol use: No   Drug use: No    Review of Systems   Review of Systems  Constitutional:  Negative for chills, fatigue and fever.  Eyes:  Negative for visual disturbance.  Respiratory:  Negative for shortness of breath.   Cardiovascular:  Negative for chest pain.  Gastrointestinal:  Negative for abdominal pain.  Musculoskeletal:  Positive for back pain.  Neurological:  Positive for light-headedness. Negative for weakness and numbness.  All other systems reviewed and are negative.  Physical Exam Updated Vital Signs BP (!) 116/99   Pulse 77   Temp 98.6 F (37 C)   Resp 16   Ht '5\' 6"'$  (1.676 m)   Wt 74.8 kg   SpO2 97%   BMI 26.63 kg/m   Physical Exam Vitals and nursing note reviewed.  Constitutional:      General: She is not in acute distress.    Appearance: Normal appearance.  She is not toxic-appearing.  HENT:     Head: Normocephalic and atraumatic.     Nose: Nose normal. No congestion.     Mouth/Throat:     Mouth: Mucous membranes are moist.  Eyes:     General: No scleral icterus.    Conjunctiva/sclera: Conjunctivae normal.  Cardiovascular:     Rate and Rhythm: Normal rate and regular rhythm.     Pulses: Normal pulses.  Pulmonary:     Effort: Pulmonary effort is normal. No respiratory distress.     Breath sounds: Normal breath sounds. No wheezing.  Abdominal:     Palpations: Abdomen is soft.     Tenderness: There is no abdominal tenderness. There is no guarding.  Musculoskeletal:        General: No swelling, tenderness, deformity or signs of injury. Normal range of motion.     Cervical back: Neck supple. No rigidity.  Skin:    General: Skin is warm and dry.     Coloration: Skin is not jaundiced.  Neurological:     General: No focal deficit present.     Mental Status: She is alert and oriented to person, place, and time.  Psychiatric:        Mood and Affect: Mood normal.        Behavior: Behavior normal.     LABS (all labs ordered are listed, but only abnormal results are displayed)  Labs Reviewed  BASIC METABOLIC PANEL - Abnormal; Notable for the following components:      Result Value   Glucose, Bld 109 (*)    Calcium 8.8 (*)    All other components within normal limits  CBC  CBG MONITORING, ED  TROPONIN I (HIGH SENSITIVITY)   ____________________________________________  EKG  Right bundle branch block with intermittent PVCs, no acute ischemic changes ____________________________________________  RADIOLOGY Almeta Monas, personally viewed and evaluated these images (plain radiographs) as part of my medical decision making, as well as reviewing the written report by the radiologist.  ED MD interpretation: CT angio to rule out dissection was obtained which is read by radiology as negative for dissection.  There is evidence of  renal mass and pulmonary nodules    ____________________________________________   PROCEDURES  Procedure(s) performed (including Critical Care):  Procedures   ____________________________________________   INITIAL IMPRESSION / ASSESSMENT AND PLAN / ED COURSE     Patient is an 87 year old female who presents with back pain and lightheadedness.  She is hemodynamically stable and well-appearing.  My suspicion for dissection is low however patient has a family history of this and is specifically concerned about it.  She is also an older woman who was hypertension so CT angio obtained to rule out dissection which was negative.  Her troponin is negative and EKG is similar  to baseline.  Rest of her labs are reassuring.  I suspect that her back pain is musculoskeletal.  She has no point tenderness on exam and no prior trauma.  I advised that she follow-up with her primary care provider pain is ongoing.  I also informed her of her renal mass and pulmonary nodules and the need to follow-up with her primary care provider regarding this      ____________________________________________   FINAL CLINICAL IMPRESSION(S) / ED DIAGNOSES  Final diagnoses:  Midline thoracic back pain, unspecified chronicity  Renal mass  Pulmonary nodules     ED Discharge Orders     None        Note:  This document was prepared using Dragon voice recognition software and may include unintentional dictation errors.    Rada Hay, MD 05/28/21 [phone]

## 2021-05-29 ENCOUNTER — Other Ambulatory Visit: Payer: Self-pay | Admitting: Nurse Practitioner

## 2021-06-15 ENCOUNTER — Ambulatory Visit: Payer: Medicare Other | Admitting: Urology

## 2021-06-15 ENCOUNTER — Telehealth: Payer: Self-pay | Admitting: Urology

## 2021-06-15 ENCOUNTER — Other Ambulatory Visit: Payer: Self-pay

## 2021-06-15 ENCOUNTER — Encounter: Payer: Self-pay | Admitting: Urology

## 2021-06-15 ENCOUNTER — Telehealth: Payer: Self-pay

## 2021-06-15 DIAGNOSIS — N2889 Other specified disorders of kidney and ureter: Secondary | ICD-10-CM

## 2021-06-15 NOTE — Telephone Encounter (Signed)
Pt was in clinic when Dr. Diamantina Providence was called to the OR pt declined to wait. Per Diamantina Providence she may r/s appt to discuss or she may have a CT scan in 6 mos then f/u to discuss or she may have a telephone visit to discuss these options. Called pt, no answer LM informing her of the information. Advised pt to call back.

## 2021-06-16 NOTE — Telephone Encounter (Signed)
Called pt informed her of the information below again. Pt voiced understanding and states that she would like to proceed with CT in 84moand follow up. Appt scheduled, CT ordered.

## 2021-06-16 NOTE — Telephone Encounter (Signed)
Disregard this phone encounter

## 2021-06-16 NOTE — Addendum Note (Signed)
Addended by: Donalee Citrin on: 06/16/2021 09:41 AM   Modules accepted: Orders

## 2021-08-07 ENCOUNTER — Ambulatory Visit: Payer: Medicare Other | Admitting: Cardiovascular Disease

## 2021-08-13 ENCOUNTER — Ambulatory Visit: Payer: Medicare Other | Admitting: Medical

## 2021-10-27 ENCOUNTER — Other Ambulatory Visit: Payer: Self-pay

## 2021-10-27 ENCOUNTER — Ambulatory Visit: Payer: Medicare Other | Admitting: Cardiovascular Disease

## 2021-10-27 ENCOUNTER — Encounter: Payer: Self-pay | Admitting: Cardiovascular Disease

## 2021-10-27 VITALS — BP 140/60 | HR 65 | Ht 66.0 in | Wt 174.2 lb

## 2021-10-27 DIAGNOSIS — I471 Supraventricular tachycardia: Secondary | ICD-10-CM

## 2021-10-27 DIAGNOSIS — I10 Essential (primary) hypertension: Secondary | ICD-10-CM

## 2021-10-27 DIAGNOSIS — E785 Hyperlipidemia, unspecified: Secondary | ICD-10-CM | POA: Diagnosis not present

## 2021-10-27 NOTE — Patient Instructions (Signed)

## 2021-10-27 NOTE — Progress Notes (Signed)
Cardiology Office Note   Date:  10/27/2021   ID:  Susan [name], DOB 11/02/1931, MRN [id number]  PCP:  Albina Billet, MD  Cardiologist:   Kathlyn Sacramento, MD   Chief Complaint  Patient presents with   Other    12 month f/u no complaints today. Meds reviewed verbally with pt.      History of Present Illness: Susan [name] is a 88 y.o. female who presents for  a followup visit regarding suspected supraventricular tachycardia.She has known history of hypertension. She presented in June, 2015 with palpitations, dizziness and presyncope. She was noted by EMS to be tachycardic with a heart rate of 170 beats per minute with underlying right bundle branch block. Rhythm strips were not recorded. She converted to normal sinus rhythm without intervention. Echocardiogram showed normal LV systolic function with no significant valvular abnormalities.  30 day outpatient telemetry showed no evidence of arrhythmia.  She has known history of sleep apnea on CPAP.   She has been doing well with no recent chest pain, shortness of breath or palpitations.  She uses her CPAP on a regular basis.   Past Medical History:  Diagnosis Date   Colon polyp    Diffuse cystic mastopathy    Hyperlipidemia    Hypertension    Macular degeneration    Tachycardia     Past Surgical History:  Procedure Laterality Date   ABDOMINAL HYSTERECTOMY     APPENDECTOMY     BREAST BIOPSY Bilateral 2006   benign, clip markers in both breasts   COLONOSCOPY  05/03/2015   EYE SURGERY Bilateral 2014   cataract   salpingo oophorectmy       Current Outpatient Medications  Medication Sig Dispense Refill   aspirin 81 MG tablet Take 81 mg by mouth daily.     atorvastatin (LIPITOR) 20 MG tablet Take 20 mg by mouth daily.     Glycerin-Hypromellose-PEG 400 (VISINE DRY EYE OP) Apply to eye as needed.     hydrochlorothiazide (HYDRODIURIL) 12.5 MG tablet Take 12.5 mg by mouth daily.  3   losartan (COZAAR) 100 MG tablet  Take 100 mg by mouth every evening.  3   metFORMIN (GLUCOPHAGE) 500 MG tablet 1000mg  in AM and 500mg  in PM  4   metoprolol tartrate (LOPRESSOR) 50 MG tablet Take 1 tablet am 1/2 tablet evening and 50 mg at night qd.     Multiple Vitamins-Minerals (PRESERVISION AREDS 2) CAPS Take 1 capsule by mouth daily.     No current facility-administered medications for this visit.    Allergies:   No known allergies    Social History:  The patient  reports that she has never smoked. She has never used smokeless tobacco. She reports that she does not drink alcohol and does not use drugs.   Family History:  The patient's family history includes Kidney cancer in her maternal aunt; Lung cancer in her father.    ROS:  Please see the history of present illness.   Otherwise, review of systems are positive for none.   All other systems are reviewed and negative.    PHYSICAL EXAM: VS:  BP 140/60 (BP Location: Left Arm, Patient Position: Sitting, Cuff Size: Normal)    Pulse 65    Ht 5\' 6"  (1.676 m)    Wt 174 lb 4 oz (79 kg)    SpO2 97%    BMI 28.12 kg/m  , BMI Body mass index is 28.12 kg/m. GEN: Well nourished, well  developed, in no acute distress  HEENT: normal  Neck: no JVD, carotid bruits, or masses Cardiac: RRR; no murmurs, rubs, or gallops,no edema  Respiratory:  clear to auscultation bilaterally, normal work of breathing GI: soft, nontender, nondistended, + BS MS: no deformity or atrophy  Skin: warm and dry, no rash Neuro:  Strength and sensation are intact Psych: euthymic mood, full affect   EKG:  EKG is ordered today. The ekg ordered today demonstrates normal sinus rhythm with PVCs, right bundle branch block.  No significant change from before.   Recent Labs: 05/27/2021: BUN 16; Creatinine, Ser 0.54; Hemoglobin 12.2; Platelets 223; Potassium 3.9; Sodium 139    Lipid Panel    Component Value Date/Time   CHOL 163 03/11/2014 0522   TRIG 333 (H) 03/11/2014 0522   HDL 44 03/11/2014 0522    VLDL 67 (H) 03/11/2014 0522   LDLCALC 52 03/11/2014 0522      Wt Readings from Last 3 Encounters:  10/27/21 174 lb 4 oz (79 kg)  06/15/21 167 lb (75.8 kg)  05/27/21 165 lb (74.8 kg)        ASSESSMENT AND PLAN:  1.   Paroxysmal supraventricular tachycardia: The patient's symptoms are well controlled with current dose of metoprolol 50/25/50 mg.  She does have PVCs but does not appear to be symptomatic.  2. Essential hypertension: Blood pressure is well controlled on current medications.   3.  Hyperlipidemia: Currently on atorvastatin 20 mg daily.   Disposition:   FU with me in 12 months  Signed,  Kathlyn Sacramento, MD  10/27/2021 11:15 AM    [place]

## 2021-12-07 ENCOUNTER — Ambulatory Visit
Admission: RE | Admit: 2021-12-07 | Discharge: 2021-12-07 | Disposition: A | Discharge status: Home / Self Care | Payer: Medicare Other | Source: Ambulatory Visit | Attending: Urology | Admitting: Urology

## 2021-12-07 ENCOUNTER — Other Ambulatory Visit: Payer: Self-pay

## 2021-12-07 DIAGNOSIS — N2889 Other specified disorders of kidney and ureter: Secondary | ICD-10-CM

## 2021-12-07 LAB — POCT I-STAT CREATININE: Creatinine, Ser: 0.6 mg/dL (ref 0.44–1.00)

## 2021-12-07 MED ORDER — IOHEXOL 300 MG/ML  SOLN
100.0000 mL | Freq: Once | INTRAMUSCULAR | Status: AC | PRN
  Administered 2021-12-07: 100 mL via INTRAVENOUS

## 2021-12-16 ENCOUNTER — Ambulatory Visit: Payer: Medicare Other | Admitting: Urology

## 2021-12-30 ENCOUNTER — Ambulatory Visit: Payer: Medicare Other | Admitting: Urology

## 2021-12-30 ENCOUNTER — Encounter: Payer: Self-pay | Admitting: Urology

## 2021-12-30 VITALS — BP 152/86 | HR 75 | Ht 66.0 in | Wt 174.0 lb

## 2021-12-30 DIAGNOSIS — N2889 Other specified disorders of kidney and ureter: Secondary | ICD-10-CM

## 2021-12-30 NOTE — Patient Instructions (Signed)
You have a small 1 cm lesion on your left kidney that was seen on CT scan.  It was unchanged on recent CT scan which is a good sign that this is very unlikely to be anything to worry about or that what ever cause you problems.  We will keep an eye on this yearly with imaging, but again, very unlikely that you would ever need surgery, biopsy, or any procedures.  These do not cause symptoms, and are very safe to watch. ? ? ?Renal Mass ?A renal mass is an abnormal growth in the kidney. It may be found while performing an MRI, CT scan, or ultrasound to evaluate other problems of the abdomen. A renal mass that is cancerous (malignant) may grow or spread quickly. Others are not cancerous (benign). ?Renal masses include: ?Tumors. These may be malignant or benign. ?The most common type of kidney cancer in adults is renal cell carcinoma. In children, the most common type of kidney cancer is Wilms tumor. ?The most common benign tumors of the kidney include renal adenomas, oncocytomas, and angiomyolipoma (AML). ?Cysts. These are fluid-filled sacs that form on or in the kidney. ?What are the causes? ?Certain types of cancers, infections, or injuries can cause a renal mass. It is not always known what causes a cyst to develop in or on the kidney. ?What are the signs or symptoms? ?Often, a renal mass does not cause any signs or symptoms; most kidney cysts do not cause symptoms. ?How is this diagnosed? ?Your health care provider may recommend tests to diagnose the cause of your renal mass. These tests may be done if a renal mass is found: ?Physical exam. ?Blood tests. ?Urine tests. ?Imaging tests, such as ultrasound, CT scan, or MRI. ?Biopsy. This is a small sample that is removed from the renal mass and tested in a lab. ?The exact tests and how often they are done will depend on: ?The size and appearance of the renal mass. ?Risk factors or medical conditions that increase your risk for problems. ?Any symptoms associated with the  renal mass, or concerns that you have about it. ?Tests and physical exams may be done once, or they may be done regularly for a period of time. Tests and exams that are done regularly will help monitor whether the mass is growing and beginning to cause problems. ?How is this treated? ?Treatment is not always needed for this condition. Your health care provider may recommend careful monitoring and regular tests and exams. Treatment will depend on the cause of the mass. ?Treatment for a cancerous renal mass may include surgical removal, chemotherapy, radiation, or immunotherapy. ?Most kidney cysts do not need to be treated. ?Follow these instructions at home: ?What you need to do at home will depend on the cause of the mass. Follow the instructions that your health care provider gives to you. In general: ?Take over-the-counter and prescription medicines only as told by your health care provider. ?If you were prescribed an antibiotic medicine, take it as told by your health care provider. Do not stop taking the antibiotic even if you start to feel better. ?Follow any restrictions that are given to you by your health care provider. ?Keep all follow-up visits. This is important. ?You may need to see your health care provider once or twice a year to have CT scans and ultrasounds. These tests will show if your renal mass has changed or grown. ?Contact a health care provider if you: ?Have pain in your side or back (flank  pain). ?Have a fever. ?Feel full soon after eating. ?Have pain or swelling in the abdomen. ?Lose weight. ?Get help right away if: ?Your pain gets worse. ?There is blood in your urine. ?You cannot urinate. ?You have chest pain. ?You have trouble breathing. ?These symptoms may represent a serious problem that is an emergency. Do not wait to see if the symptoms will go away. Get medical help right away. Call your local emergency services (911 in the [place] ?Summary ?A renal mass is an abnormal growth in the  kidney. It may be cancerous (malignant) and grow or spread quickly, or it may not be cancerous (benign). Renal masses often do not have any signs or symptoms. ?Renal masses may be found while performing an MRI, CT scan, or ultrasound for other problems of the abdomen. ?Your health care provider may recommend that you have tests to diagnose the cause of your renal mass. These may include a physical exam, blood tests, urine tests, imaging, or a biopsy. ?Treatment is not always needed for this condition. Careful monitoring may be recommended. ?This information is not intended to replace advice given to you by your health care provider. Make sure you discuss any questions you have with your health care provider. ?Document Revised: 03/17/2020 Document Reviewed: 03/17/2020 ?Elsevier Patient Education ? [place]. ? ?

## 2021-12-30 NOTE — Progress Notes (Signed)
? ?  12/30/21 ?4:59 PM  ? ?Susan [name] ?1932-05-07 ?[id number] ? ?CC: Left renal mass ? ?HPI: ?88 year old female who originally presented to the ER in August 2022 with abdominal pain, and CT incidentally showed a 1 cm left upper pole enhancing mass.  She was scheduled to follow-up with urology at that time, but I was called away urgently to the OR, and she left prior to being seen.  We had reached out regarding her recommended follow-up of a repeat CT in 6 months, and she is here today for that visit.  She denies any flank pain or gross hematuria. ? ? ?PMH: ?Past Medical History:  ?Diagnosis Date  ? Colon polyp   ? Diffuse cystic mastopathy   ? Hyperlipidemia   ? Hypertension   ? Macular degeneration   ? Tachycardia   ? ? ?Surgical History: ?Past Surgical History:  ?Procedure Laterality Date  ? ABDOMINAL HYSTERECTOMY    ? APPENDECTOMY    ? BREAST BIOPSY Bilateral 2006  ? benign, clip markers in both breasts  ? COLONOSCOPY  05/03/2015  ? EYE SURGERY Bilateral 2014  ? cataract  ? salpingo oophorectmy    ? ? ? ?Family History: ?Family History  ?Problem Relation Age of Onset  ? Lung cancer Father   ? Kidney cancer Maternal Aunt   ? Breast cancer Neg Hx   ? ? ?Social History:  reports that she has never smoked. She has never used smokeless tobacco. She reports that she does not drink alcohol and does not use drugs. ? ?Physical Exam: ?BP (!) 152/86   Pulse 75   Ht '5\' 6"'$  (1.676 m)   Wt 174 lb (78.9 kg)   BMI 28.08 kg/m?   ? ?Constitutional:  Alert and oriented, No acute distress. ?Cardiovascular: No clubbing, cyanosis, or edema. ?Respiratory: Normal respiratory effort, no increased work of breathing. ?GI: Abdomen is soft, nontender, nondistended, no abdominal masses ? ?Laboratory Data: ?Reviewed, normal renal function ? ?Pertinent Imaging: ?I have personally viewed and interpreted the CT from August 2022 as well as the most recent CT from 12/07/2021.  There is a stable 1.2 cm left upper pole exophytic enhancing left  renal mass, no hydronephrosis or other suspicious lesions. ? ?Assessment & Plan:   ?88 year old female with a 1.2 cm enhancing small left exophytic renal mass.  We reviewed possible etiologies at length, including benign lesion, cyst, tumor, and even kidney cancer.  We reviewed that typically the small lesions, especially when stable can be very safely monitored with active surveillance, and it would be very unlikely for her to require any intervention or have any symptoms or problems from this small lesion.  The repeat CT is very reassuring regarding the stability of the lesion.  We discussed other options including biopsy, percutaneous ablation, partial nephrectomy, radical nephrectomy.  She is comfortable with active surveillance moving forward which is very reasonable. ? ?Continue active surveillance for 1.2 cm small left renal mass ?RTC 1 year CT prior, consider ultrasounds moving forward if stable ? ? ?Nickolas Madrid, MD ?12/30/2021 ? ?[facility] ?[address] ?[place], [place] [address] ?[phone] ? ? ?

## 2022-03-10 ENCOUNTER — Inpatient Hospital Stay
Admission: EM | Admit: 2022-03-10 | Discharge: 2022-03-15 | DRG: 309 | Disposition: A | Discharge status: Other Acute Inpatient Hospital | Payer: Medicare Other | Attending: Internal Medicine | Admitting: Internal Medicine

## 2022-03-10 ENCOUNTER — Telehealth: Payer: Self-pay | Admitting: Cardiovascular Disease

## 2022-03-10 DIAGNOSIS — R339 Retention of urine, unspecified: Secondary | ICD-10-CM | POA: Diagnosis present

## 2022-03-10 DIAGNOSIS — I4892 Unspecified atrial flutter: Secondary | ICD-10-CM | POA: Diagnosis present

## 2022-03-10 DIAGNOSIS — I10 Essential (primary) hypertension: Secondary | ICD-10-CM | POA: Diagnosis present

## 2022-03-10 DIAGNOSIS — I4430 Unspecified atrioventricular block: Secondary | ICD-10-CM | POA: Diagnosis not present

## 2022-03-10 DIAGNOSIS — Z7982 Long term (current) use of aspirin: Secondary | ICD-10-CM

## 2022-03-10 DIAGNOSIS — Z79899 Other long term (current) drug therapy: Secondary | ICD-10-CM

## 2022-03-10 DIAGNOSIS — R001 Bradycardia, unspecified: Secondary | ICD-10-CM | POA: Diagnosis not present

## 2022-03-10 DIAGNOSIS — E663 Overweight: Secondary | ICD-10-CM | POA: Diagnosis present

## 2022-03-10 DIAGNOSIS — I4891 Unspecified atrial fibrillation: Secondary | ICD-10-CM | POA: Diagnosis present

## 2022-03-10 DIAGNOSIS — Z801 Family history of malignant neoplasm of trachea, bronchus and lung: Secondary | ICD-10-CM

## 2022-03-10 DIAGNOSIS — I110 Hypertensive heart disease with heart failure: Secondary | ICD-10-CM | POA: Diagnosis present

## 2022-03-10 DIAGNOSIS — E1165 Type 2 diabetes mellitus with hyperglycemia: Secondary | ICD-10-CM | POA: Diagnosis present

## 2022-03-10 DIAGNOSIS — Z6829 Body mass index (BMI) 29.0-29.9, adult: Secondary | ICD-10-CM

## 2022-03-10 DIAGNOSIS — I471 Supraventricular tachycardia, unspecified: Secondary | ICD-10-CM | POA: Diagnosis present

## 2022-03-10 DIAGNOSIS — I495 Sick sinus syndrome: Secondary | ICD-10-CM | POA: Diagnosis present

## 2022-03-10 DIAGNOSIS — E1169 Type 2 diabetes mellitus with other specified complication: Secondary | ICD-10-CM | POA: Diagnosis present

## 2022-03-10 DIAGNOSIS — I442 Atrioventricular block, complete: Principal | ICD-10-CM | POA: Diagnosis present

## 2022-03-10 DIAGNOSIS — I5032 Chronic diastolic (congestive) heart failure: Secondary | ICD-10-CM | POA: Diagnosis present

## 2022-03-10 DIAGNOSIS — Z7984 Long term (current) use of oral hypoglycemic drugs: Secondary | ICD-10-CM

## 2022-03-10 DIAGNOSIS — R338 Other retention of urine: Secondary | ICD-10-CM | POA: Diagnosis present

## 2022-03-10 DIAGNOSIS — E876 Hypokalemia: Secondary | ICD-10-CM | POA: Diagnosis present

## 2022-03-10 DIAGNOSIS — G4733 Obstructive sleep apnea (adult) (pediatric): Secondary | ICD-10-CM | POA: Diagnosis present

## 2022-03-10 DIAGNOSIS — E782 Mixed hyperlipidemia: Secondary | ICD-10-CM | POA: Diagnosis present

## 2022-03-10 DIAGNOSIS — Z8051 Family history of malignant neoplasm of kidney: Secondary | ICD-10-CM

## 2022-03-10 LAB — CBC WITH DIFFERENTIAL/PLATELET
Abs Immature Granulocytes: 0.02 10*3/uL (ref 0.00–0.07)
Basophils Absolute: 0 10*3/uL (ref 0.0–0.1)
Basophils Relative: 1 %
Eosinophils Absolute: 0.1 10*3/uL (ref 0.0–0.5)
Eosinophils Relative: 2 %
HCT: 37.7 % (ref 36.0–46.0)
Hemoglobin: 11.9 g/dL — ABNORMAL LOW (ref 12.0–15.0)
Immature Granulocytes: 0 %
Lymphocytes Relative: 39 %
Lymphs Abs: 2.5 10*3/uL (ref 0.7–4.0)
MCH: 28.7 pg (ref 26.0–34.0)
MCHC: 31.6 g/dL (ref 30.0–36.0)
MCV: 90.8 fL (ref 80.0–100.0)
Monocytes Absolute: 0.5 10*3/uL (ref 0.1–1.0)
Monocytes Relative: 9 %
Neutro Abs: 3.2 10*3/uL (ref 1.7–7.7)
Neutrophils Relative %: 49 %
Platelets: 221 10*3/uL (ref 150–400)
RBC: 4.15 MIL/uL (ref 3.87–5.11)
RDW: 12.9 % (ref 11.5–15.5)
WBC: 6.3 10*3/uL (ref 4.0–10.5)
nRBC: 0 % (ref 0.0–0.2)

## 2022-03-10 LAB — GLUCOSE, CAPILLARY
Glucose-Capillary: 176 mg/dL — ABNORMAL HIGH (ref 70–99)
Glucose-Capillary: 190 mg/dL — ABNORMAL HIGH (ref 70–99)

## 2022-03-10 LAB — BASIC METABOLIC PANEL
Anion gap: 9 (ref 5–15)
BUN: 20 mg/dL (ref 8–23)
CO2: 21 mmol/L — ABNORMAL LOW (ref 22–32)
Calcium: 8.8 mg/dL — ABNORMAL LOW (ref 8.9–10.3)
Chloride: 107 mmol/L (ref 98–111)
Creatinine, Ser: 0.58 mg/dL (ref 0.44–1.00)
GFR, Estimated: >60 mL/min (ref >60)
Glucose, Bld: 249 mg/dL — ABNORMAL HIGH (ref 70–99)
Potassium: 3.9 mmol/L (ref 3.5–5.1)
Sodium: 137 mmol/L (ref 135–145)

## 2022-03-10 LAB — TROPONIN I (HIGH SENSITIVITY)
Troponin I (High Sensitivity): 6 ng/L (ref <18)
Troponin I (High Sensitivity): 10 ng/L (ref <18)

## 2022-03-10 MED ORDER — HYDRALAZINE HCL 20 MG/ML IJ SOLN
5.0000 mg | Freq: Four times a day (QID) | INTRAMUSCULAR | Status: DC | PRN
Start: 2022-03-10 — End: 2022-03-15
  Administered 2022-03-12: 5 mg via INTRAVENOUS
  Filled 2022-03-10: qty 1

## 2022-03-10 MED ORDER — LACTATED RINGERS IV SOLN: INTRAVENOUS | Status: AC

## 2022-03-10 MED ORDER — PROCHLORPERAZINE EDISYLATE 10 MG/2ML IJ SOLN: 10.0000 mg | Freq: Four times a day (QID) | INTRAMUSCULAR | Status: DC | PRN

## 2022-03-10 MED ORDER — MELATONIN 5 MG PO TABS
5.0000 mg | ORAL_TABLET | Freq: Every evening | ORAL | Status: DC | PRN
  Filled 2022-03-10: qty 1

## 2022-03-10 MED ORDER — DOPAMINE-DEXTROSE 3.2-5 MG/ML-% IV SOLN
INTRAVENOUS | Status: AC
  Administered 2022-03-11: 800 mg
  Filled 2022-03-10: qty 250

## 2022-03-10 MED ORDER — GLUCAGON HCL RDNA (DIAGNOSTIC) 1 MG IJ SOLR
5.0000 mg | Freq: Once | INTRAMUSCULAR | Status: AC
  Administered 2022-03-11: 5 mg via INTRAVENOUS
  Filled 2022-03-10: qty 5

## 2022-03-10 MED ORDER — ACETAMINOPHEN 325 MG PO TABS
650.0000 mg | ORAL_TABLET | Freq: Four times a day (QID) | ORAL | Status: DC | PRN
  Administered 2022-03-10: 650 mg via ORAL
  Filled 2022-03-10: qty 2

## 2022-03-10 MED ORDER — LOSARTAN POTASSIUM 50 MG PO TABS
100.0000 mg | ORAL_TABLET | Freq: Every evening | ORAL | Status: DC
  Administered 2022-03-10 – 2022-03-14 (×5): 100 mg via ORAL
  Filled 2022-03-10 (×5): qty 2

## 2022-03-10 MED ORDER — POLYETHYLENE GLYCOL 3350 17 G PO PACK: 17.0000 g | PACK | Freq: Every day | ORAL | Status: DC | PRN

## 2022-03-10 MED ORDER — ATORVASTATIN CALCIUM 20 MG PO TABS
20.0000 mg | ORAL_TABLET | Freq: Every day | ORAL | Status: DC
  Administered 2022-03-11 – 2022-03-15 (×5): 20 mg via ORAL
  Filled 2022-03-10 (×5): qty 1

## 2022-03-10 MED ORDER — ENOXAPARIN SODIUM 40 MG/0.4ML IJ SOSY
40.0000 mg | PREFILLED_SYRINGE | INTRAMUSCULAR | Status: DC
  Administered 2022-03-10 – 2022-03-14 (×5): 40 mg via SUBCUTANEOUS
  Filled 2022-03-10 (×5): qty 0.4

## 2022-03-10 MED ORDER — HYDROCHLOROTHIAZIDE 25 MG PO TABS
25.0000 mg | ORAL_TABLET | Freq: Every day | ORAL | Status: DC
  Administered 2022-03-10 – 2022-03-15 (×6): 25 mg via ORAL
  Filled 2022-03-10 (×5): qty 1

## 2022-03-10 MED ORDER — ASPIRIN 81 MG PO TBEC
81.0000 mg | DELAYED_RELEASE_TABLET | Freq: Every day | ORAL | Status: DC
  Administered 2022-03-11 – 2022-03-15 (×5): 81 mg via ORAL
  Filled 2022-03-10 (×5): qty 1

## 2022-03-10 MED ORDER — HYDRALAZINE HCL 20 MG/ML IJ SOLN
10.0000 mg | Freq: Once | INTRAMUSCULAR | Status: AC
Start: 2022-03-10 — End: 2022-03-10
  Administered 2022-03-10: 10 mg via INTRAVENOUS
  Filled 2022-03-10: qty 1

## 2022-03-10 MED ORDER — DEXTROSE 5 % IV SOLN
5.0000 mg | Freq: Once | INTRAVENOUS | Status: DC
Start: 2022-03-11 — End: 2022-03-10

## 2022-03-10 MED ORDER — CHLORHEXIDINE GLUCONATE CLOTH 2 % EX PADS: 6.0000 | MEDICATED_PAD | Freq: Every day | CUTANEOUS | Status: DC

## 2022-03-10 NOTE — ED Provider Notes (Signed)
[facility] Provider Note   Event Date/Time   First MD Initiated Contact with Patient 03/10/22 1005     (approximate) History  Bradycardia (314 blood sugar, brady cardia hr 40's, slight dizziness, no falls ,no other complaints leg heaviness complaints )  HPI Susan [name] is a 88 y.o. female  Location: Bilateral lower extremities Duration: 2 weeks prior to arrival Timing: Stable since onset Severity: Moderate Quality: Lower extremity weakness Context: Patient states that over the last 2 weeks she has had bilateral lower extremity weakness that is worse in the morning but can occur at anytime of day Modifying factors: Worsened with walking or exertion and partially dressed Associated Symptoms: Denies ROS: Patient currently denies any vision changes, tinnitus, difficulty speaking, facial droop, sore throat, chest pain, shortness of breath, abdominal pain, nausea/vomiting/diarrhea, dysuria, or numbness/paresthesias in any extremity   Physical Exam  Triage Vital Signs: ED Triage Vitals  Enc Vitals Group     BP 03/10/22 1002 (!) 165/79     Pulse Rate 03/10/22 1002 (!) 40     Resp 03/10/22 1002 17     Temp 03/10/22 0959 98.3 F (36.8 C)     Temp Source 03/10/22 0959 Oral     SpO2 03/10/22 1002 98 %     Weight 03/10/22 0959 187 lb 6.3 oz (85 kg)     Height 03/10/22 0959 '5\' 7"'$  (1.702 m)     Head Circumference --      Peak Flow --      Pain Score --      Pain Loc --      Pain Edu? --      Excl. in [place]? --    Most recent vital signs: Vitals:   03/10/22 0959 03/10/22 1002  BP:  (!) 165/79  Pulse:  (!) 40  Resp:  17  Temp: 98.3 F (36.8 C)   SpO2:  98%   General: Awake, oriented x4. CV:  Good peripheral perfusion.  Resp:  Normal effort.  Abd:  No distention.  Other:  Elderly overweight Caucasian female laying in bed in no distress.  NIHSS 0 ED Results / Procedures / Treatments  Labs (all labs ordered are listed, but only abnormal results are  displayed) Labs Reviewed  CBC WITH DIFFERENTIAL/PLATELET - Abnormal; Notable for the following components:      Result Value   Hemoglobin 11.9 (*)    All other components within normal limits  BASIC METABOLIC PANEL - Abnormal; Notable for the following components:   CO2 21 (*)    Glucose, Bld 249 (*)    Calcium 8.8 (*)    All other components within normal limits  TROPONIN I (HIGH SENSITIVITY)  TROPONIN I (HIGH SENSITIVITY)   EKG ED ECG REPORT I, Naaman Plummer, the attending physician, personally viewed and interpreted this ECG. Date: 03/10/2022 EKG Time: 1004 Rate: 43 Rhythm: Bradycardic sinus rhythm QRS Axis: normal Intervals: normal ST/T Wave abnormalities: normal Narrative Interpretation: Bradycardic sinus rhythm.  No evidence of acute ischemia PROCEDURES: Critical Care performed: Yes, see critical care procedure note(s) .1-3 Lead EKG Interpretation Performed by: Naaman Plummer, MD Authorized by: Naaman Plummer, MD     Interpretation: abnormal     ECG rate:  35   ECG rate assessment: bradycardic     Rhythm: sinus bradycardia     Ectopy: none     Conduction: normal   CRITICAL CARE Performed by: Naaman Plummer  Total critical care time: 47 minutes  Critical care time was exclusive of separately billable procedures and treating other patients.  Critical care was necessary to treat or prevent imminent or life-threatening deterioration.  Critical care was time spent personally by me on the following activities: development of treatment plan with patient and/or surrogate as well as nursing, discussions with consultants, evaluation of patient's response to treatment, examination of patient, obtaining history from patient or surrogate, ordering and performing treatments and interventions, ordering and review of laboratory studies, ordering and review of radiographic studies, pulse oximetry and re-evaluation of patient's condition.  MEDICATIONS ORDERED IN  ED: Medications  enoxaparin (LOVENOX) injection 40 mg (has no administration in time range)  aspirin EC tablet 81 mg (has no administration in time range)  atorvastatin (LIPITOR) tablet 20 mg (has no administration in time range)  losartan (COZAAR) tablet 100 mg (has no administration in time range)  acetaminophen (TYLENOL) tablet 650 mg (has no administration in time range)  melatonin tablet 5 mg (has no administration in time range)  polyethylene glycol (MIRALAX / GLYCOLAX) packet 17 g (has no administration in time range)  prochlorperazine (COMPAZINE) injection 10 mg (has no administration in time range)  hydrALAZINE (APRESOLINE) injection 5 mg (has no administration in time range)  lactated ringers infusion ( Intravenous New Bag/Given 03/10/22 1226)   IMPRESSION / MDM / ASSESSMENT AND PLAN / ED COURSE  I reviewed the triage vital signs and the nursing notes.                             The patient is on the cardiac monitor to evaluate for evidence of arrhythmia and/or significant heart rate changes. Patient's presentation is most consistent with acute presentation with potential threat to life or bodily function. The patient is suffering from bradycardia without concerning signs of instability on exam such as altered mental status, hypotension, evidence of cardiac end organ dysfunction, or acute heart failure.  Ddx: sick sinus syndrome, vasovagal, unstable heart block (ekg with no signs of Mobitz II, complete heart block), right coronary artery myocardial infarction (neg trop_, non STEMI, no chest pain), infection (afebrile, no leukocytosis, no recent illness), hypothyroidism, hyperkalemia, hypoglycemia, dehydration, or intoxication (beta blockade, calcium channel blockade, clonidine, digoxin, opiates, alcohol or other). Workup: CBC, CMP, Trop, EKG, telemetry Results: CBC shows mild stable anemia to 11.9 CMP significant for hyperglycemia to 249 and mild hypocalcemia to 8.8 Trop Neg x1 EKG  interpreted by me and shows sinus bradycardia  Ptient remained asymptomatic throughout ED course.  Consults: I spoke to Dr. Garen Lah cardiology who recommends holding patient's metoprolol dosing and pacing/atropine if patient becomes symptomatic Dispo: Admit to medicine with cardiology following   FINAL CLINICAL IMPRESSION(S) / ED DIAGNOSES   Final diagnoses:  None   Rx / DC Orders   ED Discharge Orders     None      Note:  This document was prepared using Dragon voice recognition software and may include unintentional dictation errors.   Naaman Plummer, MD 03/10/22 [id number]

## 2022-03-10 NOTE — ED Triage Notes (Signed)
314 blood sugar, brady cardia hr 40's, slight dizziness, no falls ,no other complaints  other than leg heaviness

## 2022-03-10 NOTE — Telephone Encounter (Signed)
Spoke with patient and she sounded very weak. She stated that she just got back from vacation and her HR has been running in the 40's for the last couple weeks. She couldn't remember what her BP was this morning, but thought it was in the 170's over 100 and something. She stated that her legs and arms are feeling very weak. I advised the patient that she should go to the ED immediately and she also felt that would be the best thing. She stated that she was going to call 911 after we hung up. Will route to Dr. Fletcher Anon as an Juluis Rainier.

## 2022-03-10 NOTE — ED Notes (Signed)
Informed RN bed assigned 

## 2022-03-10 NOTE — Telephone Encounter (Signed)
STAT if HR is under 50 or over 120 (normal HR is 60-100 beats per minute)  What is your heart rate? 40  Do you have a log of your heart rate readings (document readings)? Doesn't have a log.   Do you have any other symptoms? Her legs and arms feel weak.  This has been going on for a couple of weeks.

## 2022-03-10 NOTE — Consult Note (Signed)
Cardiology Consultation:   Patient ID: Susan [name] MRN: [id number]; DOB: [birth year]  Admit date: 03/10/2022 Date of Consult: 03/10/2022  PCP:  Albina Billet, MD   [facility] Providers Cardiologist:  Kathlyn Sacramento, MD        Patient Profile:   Susan [name] is a 88 y.o. female with a hx of essential hypertension, paroxysmal supraventricular tachycardia, OSA on CPAP, left renal mass, and hyperlipidemia who is being seen 03/10/2022 for the evaluation of symptomatic bradycardia at the request of Dr. Nevada Crane.  History of Present Illness:   Susan [name] presented to the emergency department with complaints of bilateral lower extremity weakness, dizziness, and heart rate in the 40's at home this morning. She stated that these symptoms had been ongoing for the last 2 weeks. Patient stated that she has been having a hard time just walking around [employer] because her legs and arms become so weak when she is up and trying to move around. She had called the office this morning and spoke with one of the nurses who advised her to go to the emergency department for further evaluation. She denies any chest pain or shortness of breath.   Initial vital signs: blood pressure 165/79, heart rate 40, rr 17, temp 98.3  Medications: none given  Imaging: none ordered  She has been followed by Dr Fletcher Anon since she presented in 03/2014 with palpitations, dizziness, and presyncope and was noted to be tachycardiac by EMS. At that time her heart rate was running around 170 bpm with an underlying RBBB. She converted back in to a SR with intervention. Echo reveled normal LVSF with no significant valvular abnormalities. She then wore a 30 event monitor that showed no evidence of arrhythmia. She was seen again in 05/2018 with concerns for worsening nocturnal palpitations. She then wore a 2 week Zio monitor showing NSR with average heart rate 65 bpm with one run of SVT lasting approximately 20 seconds with a max  heart rate of 124 bpm. There was no evidence of atrial fibrillation. No chnages were made to her medications at this time. In 07/2018 she noted overall improvement in symptoms. 08/2020 she reported occasional orthostasis symptoms and some fluttering. Her EKG revealed SR with PVC's and her chronic RBBB. No medications were changed. 05/2021 she presented to the emergency department with back pain and dizziness. CT angio was obtained and ruled dissection, as the patient was concerned as she has a family history. She was discharged home with stable labs and vital signs. Recent follow-up in the office(10/2021) she was without complaints and remained on her current dosing of metoprolol 50/25/50 mg.    Past Medical History:  Diagnosis Date   Colon polyp    Diffuse cystic mastopathy    Hyperlipidemia    Hypertension    Macular degeneration    Tachycardia     Past Surgical History:  Procedure Laterality Date   ABDOMINAL HYSTERECTOMY     APPENDECTOMY     BREAST BIOPSY Bilateral 2006   benign, clip markers in both breasts   COLONOSCOPY  05/03/2015   EYE SURGERY Bilateral 2014   cataract   salpingo oophorectmy       Home Medications:  Prior to Admission medications   Medication Sig Start Date End Date Taking? Authorizing Provider  aspirin 81 MG tablet Take 81 mg by mouth daily.   Yes [provider]  atorvastatin (LIPITOR) 20 MG tablet Take 20 mg by mouth daily.   Yes [provider]  hydrochlorothiazide (HYDRODIURIL) 12.5 MG tablet Take 12.5 mg by mouth daily. 05/04/17  Yes [provider]  losartan (COZAAR) 100 MG tablet Take 100 mg by mouth every evening. 05/04/17  Yes [provider]  metFORMIN (GLUCOPHAGE) 500 MG tablet '1500mg'$  in AM and '500mg'$  in PM 02/02/18  Yes [provider]  metoprolol tartrate (LOPRESSOR) 50 MG tablet Take 1 tablet am 1/2 tablet evening and 50 mg at night qd.   Yes [provider]  Multiple Vitamins-Minerals (PRESERVISION  AREDS 2) CAPS Take 1 capsule by mouth in the morning and at bedtime. Lunch and late afternoon   Yes [provider]  Glycerin-Hypromellose-PEG 400 (VISINE DRY EYE OP) Apply to eye as needed.    [provider]    Inpatient Medications: Scheduled Meds:  [START ON 03/11/2022] aspirin EC  81 mg Oral Daily   [START ON 03/11/2022] atorvastatin  20 mg Oral Daily   enoxaparin (LOVENOX) injection  40 mg Subcutaneous Q24H   losartan  100 mg Oral QPM   Continuous Infusions:  lactated ringers 50 mL/hr at 03/10/22 1226   PRN Meds: acetaminophen, hydrALAZINE, melatonin, polyethylene glycol, prochlorperazine  Allergies:    Allergies  Allergen Reactions   No Known Allergies     Social History:   Social History   Socioeconomic History   Marital status: Divorced    Spouse name: Not on file   Number of children: Not on file   Years of education: Not on file   Highest education level: Not on file  Occupational History   Not on file  Tobacco Use   Smoking status: Never   Smokeless tobacco: Never  Vaping Use   Vaping Use: Never used  Substance and Sexual Activity   Alcohol use: No   Drug use: No   Sexual activity: Not on file  Other Topics Concern   Not on file  Social History Narrative   Not on file   Social Determinants of Health   Financial Resource Strain: Not on file  Food Insecurity: Not on file  Transportation Needs: Not on file  Physical Activity: Not on file  Stress: Not on file  Social Connections: Not on file  Intimate Partner Violence: Not on file    Family History:    Family History  Problem Relation Age of Onset   Lung cancer Father    Kidney cancer Maternal Aunt    Breast cancer Neg Hx      ROS:  Please see the history of present illness.  Review of Systems  Constitutional:  Positive for malaise/fatigue.  HENT: Negative.    Eyes: Negative.   Respiratory:  Positive for shortness of breath.   Cardiovascular:  Positive for palpitations.   Gastrointestinal: Negative.   Genitourinary: Negative.   Musculoskeletal: Negative.   Skin: Negative.   Neurological:  Positive for dizziness and weakness.  Endo/Heme/Allergies: Negative.   Psychiatric/Behavioral: Negative.     All other ROS reviewed and negative.     Physical Exam/Data:   Vitals:   03/10/22 0959 03/10/22 1002  BP:  (!) 165/79  Pulse:  (!) 40  Resp:  17  Temp: 98.3 F (36.8 C)   TempSrc: Oral   SpO2:  98%  Weight: 85 kg   Height: '5\' 7"'$  (1.702 m)    No intake or output data in the 24 hours ending 03/10/22 1424    03/10/2022    9:59 AM 12/30/2021    3:03 PM 10/27/2021   10:06 AM  Last 3  Weights  Weight (lbs) 187 lb 6.3 oz 174 lb 174 lb 4 oz  Weight (kg) 85 kg 78.926 kg 79.039 kg     Body mass index is 29.35 kg/m.  General:  Well nourished, well developed, in no acute distress HEENT: normal, glasses on Neck: no JVD appreciated Vascular: No carotid bruits; Distal pulses 2+ bilaterally Cardiac:  normal S1, S2; RRR; II/VI systolic murmur best heard at the RSB Lungs:  clear to auscultation bilaterally, no wheezing, rhonchi or rales, unlabored on room air Abd: soft, nontender, no hepatomegaly  Ext: no edema Musculoskeletal:  No deformities, BUE and BLE strength normal and equal Skin: warm and dry  Neuro:  CNs 2-12 intact, no focal abnormalities noted Psych:  Normal affect   EKG:  The EKG was personally reviewed and demonstrates:  poor quality with large quantity of artifact, possibly complete heart block  Telemetry:  Telemetry was personally reviewed and demonstrates:  Mobitz type II block with transient CHB  Relevant CV Studies: No recent studies have been completed   Laboratory Data:  High Sensitivity Troponin:   Recent Labs  Lab 03/10/22 1055  TROPONINIHS 6     Chemistry Recent Labs  Lab 03/10/22 1055  NA 137  K 3.9  CL 107  CO2 21*  GLUCOSE 249*  BUN 20  CREATININE 0.58  CALCIUM 8.8*  GFRNONAA >60  ANIONGAP 9    No results for  input(s): PROT, ALBUMIN, AST, ALT, ALKPHOS, BILITOT in the last 168 hours. Lipids No results for input(s): CHOL, TRIG, HDL, LABVLDL, LDLCALC, CHOLHDL in the last 168 hours.  Hematology Recent Labs  Lab 03/10/22 1055  WBC 6.3  RBC 4.15  HGB 11.9*  HCT 37.7  MCV 90.8  MCH 28.7  MCHC 31.6  RDW 12.9  PLT 221   Thyroid No results for input(s): TSH, FREET4 in the last 168 hours.  BNPNo results for input(s): BNP, PROBNP in the last 168 hours.  DDimer No results for input(s): DDIMER in the last 168 hours.   Radiology/Studies:  No results found.   Assessment and Plan:   AV block, 2nd degree, Mobitz II with CHB likely secondary to beta blocker use -dizziness and palpitations are current symptoms, no syncope or presyncope -hold beta blocker allowing for washout, patient was taking more than recommended dose of metoprolol as listed in her last visit note -continue cardiac monitor -orthostatic vital signs -Hs troponin 6 -check TSH -potassium 3.9 -mg and phosphorus pending -gentle hydration has been ordered by primary team -transcutaneous pacing pads -echocardiogram ordered for heart murmur  2. Essential hypertension -check orthostatic vital signs -vitals per unit protocol -continue losartan 100 mg daily -continue hydralazine 5 mg IVP PRN -continue HCTZ 25 mg daily  3. Hyperlipidemia -continue statin  -lipid panel   Risk Assessment/Risk Scores:        For questions or updates, please contact [place] Please consult [email or web address] for contact info under    Signed, Paulita Licklider, NP  03/10/2022 2:24 PM

## 2022-03-10 NOTE — H&P (Addendum)
History and Physical  Susan [name] TGG:[id number] DOB: 07-Jun-1932 DOA: 03/10/2022  Referring physician: Dr. Cheri Fowler, [place] PCP: Albina Billet, MD  Outpatient Specialists: Cardiology Patient coming from: Home.  Chief Complaint: Dizziness, no falls.  HPI: Susan [name] is a 88 y.o. female with medical history significant for paroxysmal supraventricular tachycardia, OSA on CPAP, left renal mass, hypertension, hyperlipidemia, who presented to [facility] ED with complaints of dizziness, with no falls for the past 2 weeks and worse this morning.  Patient endorses she has been taking home Lopressor 50 mg TID, she is unable to say for how long but states she has been on Lopressor for years.  Denies chest pain or anginal symptoms.  Upon presentation to the ED her heart rate is in the 30s-40s.  EDP discussed the case with cardiology who recommended holding off her home metoprolol for washout, to correct electrolytes abnormalities, and to admit for observation.  TR[facility], hospitalist service, was asked to admit.    ED Course:  Tmax 98.3.  BP 167/68, pulse 34, respiration rate 16, O2 saturation 98% on room air.  Lab studies remarkable for BUN 21, serum glucose 249, BUN 20, creatinine 0.58, calcium 8.8, albumin 4.0.  WBC 6.3, hemoglobin 11.9.  Review of Systems: Review of systems as noted in the HPI. All other systems reviewed and are negative.   Past Medical History:  Diagnosis Date   Colon polyp    Diffuse cystic mastopathy    Hyperlipidemia    Hypertension    Macular degeneration    Tachycardia    Past Surgical History:  Procedure Laterality Date   ABDOMINAL HYSTERECTOMY     APPENDECTOMY     BREAST BIOPSY Bilateral 2006   benign, clip markers in both breasts   COLONOSCOPY  05/03/2015   EYE SURGERY Bilateral 2014   cataract   salpingo oophorectmy      Social History:  reports that she has never smoked. She has never used smokeless tobacco. She reports that she does not drink alcohol and does  not use drugs.   Allergies  Allergen Reactions   No Known Allergies     Family History  Problem Relation Age of Onset   Lung cancer Father    Kidney cancer Maternal Aunt    Breast cancer Neg Hx       Prior to Admission medications   Medication Sig Start Date End Date Taking? Authorizing Provider  aspirin 81 MG tablet Take 81 mg by mouth daily.   Yes [provider]  atorvastatin (LIPITOR) 20 MG tablet Take 20 mg by mouth daily.   Yes [provider]  hydrochlorothiazide (HYDRODIURIL) 12.5 MG tablet Take 12.5 mg by mouth daily. 05/04/17  Yes [provider]  losartan (COZAAR) 100 MG tablet Take 100 mg by mouth every evening. 05/04/17  Yes [provider]  metFORMIN (GLUCOPHAGE) 500 MG tablet '1500mg'$  in AM and '500mg'$  in PM 02/02/18  Yes [provider]  metoprolol tartrate (LOPRESSOR) 50 MG tablet Take 1 tablet am 1/2 tablet evening and 50 mg at night qd.   Yes [provider]  Multiple Vitamins-Minerals (PRESERVISION AREDS 2) CAPS Take 1 capsule by mouth in the morning and at bedtime. Lunch and late afternoon   Yes [provider]  Glycerin-Hypromellose-PEG 400 (VISINE DRY EYE OP) Apply to eye as needed.    [provider]    Physical Exam: BP (!) 165/79 (BP Location: Right Arm)   Pulse (!) 40   Temp 98.3 F (  36.8 C) (Oral)   Resp 17   Ht '5\' 7"'$  (1.702 m)   Wt 85 kg   SpO2 98%   BMI 29.35 kg/m   General: 88 y.o. year-old female well developed well nourished in no acute distress.  Alert and oriented x3. Cardiovascular: Bradycardia with no rubs or gallops.  No thyromegaly or JVD noted.  No lower extremity edema. 2/4 pulses in all 4 extremities. Respiratory: Clear to auscultation with no wheezes or rales. Good inspiratory effort. Abdomen: Soft nontender nondistended with normal bowel sounds x4 quadrants. Muskuloskeletal: No cyanosis, clubbing or edema noted bilaterally Neuro: CN II-XII intact, strength, sensation,  reflexes Skin: No ulcerative lesions noted or rashes Psychiatry: Judgement and insight appear normal. Mood is appropriate for condition and setting          Labs on Admission:  Basic Metabolic Panel: Recent Labs  Lab 03/10/22 1055  NA 137  K 3.9  CL 107  CO2 21*  GLUCOSE 249*  BUN 20  CREATININE 0.58  CALCIUM 8.8*   Liver Function Tests: No results for input(s): AST, ALT, ALKPHOS, BILITOT, PROT, ALBUMIN in the last 168 hours. No results for input(s): LIPASE, AMYLASE in the last 168 hours. No results for input(s): AMMONIA in the last 168 hours. CBC: Recent Labs  Lab 03/10/22 1055  WBC 6.3  NEUTROABS 3.2  HGB 11.9*  HCT 37.7  MCV 90.8  PLT 221   Cardiac Enzymes: No results for input(s): CKTOTAL, CKMB, CKMBINDEX, TROPONINI in the last 168 hours.  BNP (last 3 results) No results for input(s): BNP in the last 8760 hours.  ProBNP (last 3 results) No results for input(s): PROBNP in the last 8760 hours.  CBG: No results for input(s): GLUCAP in the last 168 hours.  Radiological Exams on Admission: No results found.  EKG: I independently viewed the EKG done and my findings are as followed: Atrial fibrillation rate of 89.  Nonspecific ST-T changes.  QTc 480.  Assessment/Plan Present on Admission:  Bradycardia  Principal Problem:   Bradycardia  Atrial fibrillation with slow ventricular response, on home Lopressor.  The patient has been taking total of 150 mg daily of home Lopressor, 50 mg 3 times daily instead of prescribed doses.   EDP discussed the case with cardiology who recommended beta-blocker washout by holding off home Lopressor, and to correct any electrolyte abnormalities. Obtain TSH to rule out other causes of bradycardia Rest of management per cardiology  Dizziness, suspect secondary to symptomatic bradycardia Obtain orthostatic vital signs Gentle IV fluid hydration PT OT assessment Fall precautions.  Type 2 diabetes with hyperglycemia Last  hemoglobin A1c 6.9 in 2015 Obtain updated hemoglobin A1c Start insulin sliding scale.  Essential hypertension, BP is not at goal, elevated Continue to hold off home Lopressor Resume home losartan Closely monitor vital signs IV hydralazine as needed with parameters  Hyperlipidemia Resume home Lipitor      DVT prophylaxis: Subcu Lovenox daily  Code Status: Full code  Family Communication: Family member at bedside  Disposition Plan: Admitted to telemetry cardiac unit.  Consults called: Cardiology consulted by EDP.  Admission status: Observation status.   Status is: Observation    Kayleen Memos MD [facility] Pager [phone]  If 7PM-7AM, please contact night-coverage [email or web address] Password [email or web address]  03/10/2022, 12:16 PM

## 2022-03-10 NOTE — Progress Notes (Signed)
Rapid Response Event Note   Reason for Call : Symptomatic Bradycardia  Initial Focused Assessment: Patient with a new onset of dizziness and malaise associated with symptomatic bradycardia. Patient has episodes of very low HR with long pauses.  Interventions: Transfer from room 238 to ICU 03.  Plan of Care: Dopamine drip and glucagon.  Event Summary: Patient with symptomatic bradycardia transferred to the ICU and changed level of care to ICU.  MD Notified: Sharion Settler, NP Call Time: [place] Time: 2316 End Time: [place], RN

## 2022-03-10 NOTE — Progress Notes (Signed)
   03/10/22 2232  Assess: MEWS Score  Temp 98.3 F (36.8 C)  BP (!) 210/62  MAP (mmHg) 101  Pulse Rate (!) 40  ECG Heart Rate (!) 50  Resp (!) 24  SpO2 97 %  O2 Device Room Air  Assess: MEWS Score  MEWS Temp 0  MEWS Systolic 2  MEWS Pulse 1  MEWS RR 1  MEWS LOC 0  MEWS Score 4  MEWS Score Color Red  Assess: if the MEWS score is Yellow or Red  Were vital signs taken at a resting state? Yes  Focused Assessment No change from prior assessment  Does the patient meet 2 or more of the SIRS criteria? No  MEWS guidelines implemented *See Row Information* Yes  Treat  MEWS Interventions Administered scheduled meds/treatments;Administered prn meds/treatments;Escalated (See documentation below)  Pain Scale 0-10  Pain Score 2  Pain Type Acute pain  Pain Location Head  Pain Descriptors / Indicators Headache;Pressure  Take Vital Signs  Increase Vital Sign Frequency  Red: Q 1hr X 4 then Q 4hr X 4, if remains red, continue Q 4hrs  Escalate  MEWS: Escalate Red: discuss with charge nurse/RN and provider, consider discussing with RRT  Notify: Charge Nurse/RN  Name of Charge Nurse/RN Notified Felicia  Date Charge Nurse/RN Notified 03/10/22  Time Charge Nurse/RN Notified 2232  Notify: Provider  Provider Name/Title Sharion Settler, NP  Date Provider Notified 03/10/22  Time Provider Notified 2232  Method of Notification Face-to-face;Page  Notification Reason Change in status (red mews BP, HR)  Provider response At bedside;See new orders  Date of Provider Response 03/10/22  Time of Provider Response 2232  Notify: Rapid Response  Date Rapid Response Notified 03/10/22  Time Rapid Response Notified 2300  Document  Patient Outcome Transferred/level of care increased  Progress note created (see row info) Yes  Assess: SIRS CRITERIA  SIRS Temperature  0  SIRS Pulse 0  SIRS Respirations  1  SIRS WBC 0  SIRS Score Sum  1

## 2022-03-11 ENCOUNTER — Observation Stay (HOSPITAL_COMMUNITY)
Admit: 2022-03-11 | Discharge: 2022-03-11 | Disposition: A | Discharge status: Home / Self Care | Payer: Medicare Other | Attending: Cardiology | Admitting: Cardiology

## 2022-03-11 ENCOUNTER — Other Ambulatory Visit: Payer: Self-pay

## 2022-03-11 DIAGNOSIS — I110 Hypertensive heart disease with heart failure: Secondary | ICD-10-CM | POA: Diagnosis present

## 2022-03-11 DIAGNOSIS — R011 Cardiac murmur, unspecified: Secondary | ICD-10-CM

## 2022-03-11 DIAGNOSIS — I471 Supraventricular tachycardia: Secondary | ICD-10-CM

## 2022-03-11 DIAGNOSIS — Z7984 Long term (current) use of oral hypoglycemic drugs: Secondary | ICD-10-CM | POA: Diagnosis not present

## 2022-03-11 DIAGNOSIS — R001 Bradycardia, unspecified: Secondary | ICD-10-CM | POA: Diagnosis present

## 2022-03-11 DIAGNOSIS — E782 Mixed hyperlipidemia: Secondary | ICD-10-CM | POA: Diagnosis present

## 2022-03-11 DIAGNOSIS — Z801 Family history of malignant neoplasm of trachea, bronchus and lung: Secondary | ICD-10-CM | POA: Diagnosis not present

## 2022-03-11 DIAGNOSIS — I10 Essential (primary) hypertension: Secondary | ICD-10-CM | POA: Diagnosis not present

## 2022-03-11 DIAGNOSIS — E1169 Type 2 diabetes mellitus with other specified complication: Secondary | ICD-10-CM | POA: Diagnosis present

## 2022-03-11 DIAGNOSIS — E785 Hyperlipidemia, unspecified: Secondary | ICD-10-CM | POA: Diagnosis not present

## 2022-03-11 DIAGNOSIS — E663 Overweight: Secondary | ICD-10-CM | POA: Diagnosis present

## 2022-03-11 DIAGNOSIS — R338 Other retention of urine: Secondary | ICD-10-CM

## 2022-03-11 DIAGNOSIS — I4891 Unspecified atrial fibrillation: Secondary | ICD-10-CM | POA: Diagnosis present

## 2022-03-11 DIAGNOSIS — R339 Retention of urine, unspecified: Secondary | ICD-10-CM | POA: Diagnosis present

## 2022-03-11 DIAGNOSIS — E876 Hypokalemia: Secondary | ICD-10-CM | POA: Diagnosis present

## 2022-03-11 DIAGNOSIS — Z6829 Body mass index (BMI) 29.0-29.9, adult: Secondary | ICD-10-CM | POA: Diagnosis not present

## 2022-03-11 DIAGNOSIS — G4733 Obstructive sleep apnea (adult) (pediatric): Secondary | ICD-10-CM | POA: Diagnosis present

## 2022-03-11 DIAGNOSIS — I495 Sick sinus syndrome: Secondary | ICD-10-CM | POA: Diagnosis present

## 2022-03-11 DIAGNOSIS — I4892 Unspecified atrial flutter: Secondary | ICD-10-CM | POA: Diagnosis present

## 2022-03-11 DIAGNOSIS — Z79899 Other long term (current) drug therapy: Secondary | ICD-10-CM | POA: Diagnosis not present

## 2022-03-11 DIAGNOSIS — I442 Atrioventricular block, complete: Secondary | ICD-10-CM | POA: Diagnosis present

## 2022-03-11 DIAGNOSIS — Z8051 Family history of malignant neoplasm of kidney: Secondary | ICD-10-CM | POA: Diagnosis not present

## 2022-03-11 DIAGNOSIS — Z7982 Long term (current) use of aspirin: Secondary | ICD-10-CM | POA: Diagnosis not present

## 2022-03-11 DIAGNOSIS — E1165 Type 2 diabetes mellitus with hyperglycemia: Secondary | ICD-10-CM | POA: Diagnosis present

## 2022-03-11 DIAGNOSIS — I4430 Unspecified atrioventricular block: Secondary | ICD-10-CM

## 2022-03-11 DIAGNOSIS — I5032 Chronic diastolic (congestive) heart failure: Secondary | ICD-10-CM | POA: Diagnosis present

## 2022-03-11 LAB — BASIC METABOLIC PANEL
Anion gap: 6 (ref 5–15)
Anion gap: 7 (ref 5–15)
BUN: 20 mg/dL (ref 8–23)
BUN: 21 mg/dL (ref 8–23)
CO2: 24 mmol/L (ref 22–32)
CO2: 24 mmol/L (ref 22–32)
Calcium: 8.5 mg/dL — ABNORMAL LOW (ref 8.9–10.3)
Calcium: 8.9 mg/dL (ref 8.9–10.3)
Chloride: 107 mmol/L (ref 98–111)
Chloride: 107 mmol/L (ref 98–111)
Creatinine, Ser: 0.65 mg/dL (ref 0.44–1.00)
Creatinine, Ser: 0.66 mg/dL (ref 0.44–1.00)
GFR, Estimated: >60 mL/min (ref >60)
GFR, Estimated: >60 mL/min (ref >60)
Glucose, Bld: 199 mg/dL — ABNORMAL HIGH (ref 70–99)
Glucose, Bld: 245 mg/dL — ABNORMAL HIGH (ref 70–99)
Potassium: 3.2 mmol/L — ABNORMAL LOW (ref 3.5–5.1)
Potassium: 3.4 mmol/L — ABNORMAL LOW (ref 3.5–5.1)
Sodium: 137 mmol/L (ref 135–145)
Sodium: 138 mmol/L (ref 135–145)

## 2022-03-11 LAB — CBC
HCT: 37.1 % (ref 36.0–46.0)
Hemoglobin: 12.2 g/dL (ref 12.0–15.0)
MCH: 29 pg (ref 26.0–34.0)
MCHC: 32.9 g/dL (ref 30.0–36.0)
MCV: 88.3 fL (ref 80.0–100.0)
Platelets: 210 10*3/uL (ref 150–400)
RBC: 4.2 MIL/uL (ref 3.87–5.11)
RDW: 12.8 % (ref 11.5–15.5)
WBC: 12.5 10*3/uL — ABNORMAL HIGH (ref 4.0–10.5)
nRBC: 0 % (ref 0.0–0.2)

## 2022-03-11 LAB — GLUCOSE, CAPILLARY
Glucose-Capillary: 220 mg/dL — ABNORMAL HIGH (ref 70–99)
Glucose-Capillary: 212 mg/dL — ABNORMAL HIGH (ref 70–99)
Glucose-Capillary: 215 mg/dL — ABNORMAL HIGH (ref 70–99)
Glucose-Capillary: 142 mg/dL — ABNORMAL HIGH (ref 70–99)
Glucose-Capillary: 214 mg/dL — ABNORMAL HIGH (ref 70–99)
Glucose-Capillary: 202 mg/dL — ABNORMAL HIGH (ref 70–99)

## 2022-03-11 LAB — URINALYSIS, ROUTINE W REFLEX MICROSCOPIC
Bilirubin Urine: NEGATIVE
Glucose, UA: NEGATIVE mg/dL
Hgb urine dipstick: NEGATIVE
Ketones, ur: NEGATIVE mg/dL
Leukocytes,Ua: NEGATIVE
Nitrite: NEGATIVE
Protein, ur: NEGATIVE mg/dL
Specific Gravity, Urine: 1.009 (ref 1.005–1.030)
pH: 6 (ref 5.0–8.0)

## 2022-03-11 LAB — MRSA NEXT GEN BY PCR, NASAL: MRSA by PCR Next Gen: NOT DETECTED

## 2022-03-11 LAB — ECHOCARDIOGRAM COMPLETE
AR max vel: 1.52 cm2
AV Area VTI: 1.86 cm2
AV Area mean vel: 1.67 cm2
AV Mean grad: 8.7 mmHg
AV Peak grad: 18 mmHg
Ao pk vel: 2.12 m/s
Area-P 1/2: 1.96 cm2
Height: 67 in
MV VTI: 1.26 cm2
S' Lateral: 2.6 cm
Weight: 2723.12 oz

## 2022-03-11 LAB — MAGNESIUM
Magnesium: 1.9 mg/dL (ref 1.7–2.4)
Magnesium: 2 mg/dL (ref 1.7–2.4)

## 2022-03-11 LAB — PHOSPHORUS: Phosphorus: 3.7 mg/dL (ref 2.5–4.6)

## 2022-03-11 LAB — TROPONIN I (HIGH SENSITIVITY)
Troponin I (High Sensitivity): 17 ng/L (ref <18)
Troponin I (High Sensitivity): 68 ng/L — ABNORMAL HIGH (ref <18)

## 2022-03-11 LAB — HEMOGLOBIN A1C
Hgb A1c MFr Bld: 7.5 % — ABNORMAL HIGH (ref 4.8–5.6)
Mean Plasma Glucose: 168.55 mg/dL

## 2022-03-11 LAB — TSH: TSH: 2.5 u[IU]/mL (ref 0.350–4.500)

## 2022-03-11 MED ORDER — INSULIN ASPART 100 UNIT/ML IJ SOLN
0.0000 [IU] | Freq: Three times a day (TID) | INTRAMUSCULAR | Status: DC
  Administered 2022-03-11: 1 [IU] via SUBCUTANEOUS
  Administered 2022-03-11: 3 [IU] via SUBCUTANEOUS
  Administered 2022-03-12 (×2): 1 [IU] via SUBCUTANEOUS
  Administered 2022-03-12 – 2022-03-13 (×2): 2 [IU] via SUBCUTANEOUS
  Administered 2022-03-13: 1 [IU] via SUBCUTANEOUS
  Administered 2022-03-13: 3 [IU] via SUBCUTANEOUS
  Administered 2022-03-14 – 2022-03-15 (×3): 2 [IU] via SUBCUTANEOUS
  Filled 2022-03-11 (×11): qty 1

## 2022-03-11 MED ORDER — ATROPINE SULFATE 1 MG/10ML IJ SOSY
0.5000 mg | PREFILLED_SYRINGE | INTRAMUSCULAR | Status: DC | PRN
Start: 2022-03-11 — End: 2022-03-15
  Administered 2022-03-11: 0.5 mg via INTRAVENOUS
  Filled 2022-03-11: qty 10

## 2022-03-11 MED ORDER — POTASSIUM CHLORIDE CRYS ER 20 MEQ PO TBCR
40.0000 meq | EXTENDED_RELEASE_TABLET | Freq: Once | ORAL | Status: AC
Start: 2022-03-11 — End: 2022-03-11
  Administered 2022-03-11: 40 meq via ORAL
  Filled 2022-03-11: qty 2

## 2022-03-11 MED ORDER — CHLORHEXIDINE GLUCONATE CLOTH 2 % EX PADS: 6.0000 | MEDICATED_PAD | Freq: Every day | CUTANEOUS | Status: DC

## 2022-03-11 MED ORDER — ATROPINE SULFATE 1 MG/10ML IJ SOSY: 0.5000 mg | PREFILLED_SYRINGE | Freq: Once | INTRAMUSCULAR | Status: DC

## 2022-03-11 MED ORDER — ALUM & MAG HYDROXIDE-SIMETH 200-200-20 MG/5ML PO SUSP
30.0000 mL | ORAL | Status: AC
Start: 2022-03-11 — End: 2022-03-11
  Administered 2022-03-11: 30 mL via ORAL
  Filled 2022-03-11: qty 30

## 2022-03-11 MED ORDER — INSULIN ASPART 100 UNIT/ML IJ SOLN
0.0000 [IU] | Freq: Every day | INTRAMUSCULAR | Status: DC
  Administered 2022-03-11: 2 [IU] via SUBCUTANEOUS
  Filled 2022-03-11: qty 1

## 2022-03-11 MED ORDER — DOPAMINE-DEXTROSE 3.2-5 MG/ML-% IV SOLN: 2.5000 ug/kg/min | INTRAVENOUS | Status: DC

## 2022-03-11 NOTE — Assessment & Plan Note (Addendum)
High degree (Mobitz 2 & 3rd degree).  Dopamine turned off.  Cardiology would like to wait another 24 to 48 hours to monitor her off metoprolol.  May transfer to [facility] over the weekend with pacemaker on Monday per cardiology/Dr. Caryl Comes  TSH normal

## 2022-03-11 NOTE — Evaluation (Signed)
Physical Therapy Evaluation Patient Details Name: Susan [name] MRN: [id number] DOB: 14-Jun-1932 Today's Date: 03/11/2022  History of Present Illness  Susan [name] is a 87 y.o. female with medical history significant for paroxysmal supraventricular tachycardia, OSA on CPAP, left renal mass, hypertension, hyperlipidemia, who presented to [facility] ED with complaints of dizziness, with no falls for the past 2 weeks and worse this morning.  Patient endorses she has been taking home Lopressor 50 mg TID, she is unable to say for how long but states she has been on Lopressor for years.  Denies chest pain or anginal symptoms. Upon presentation to the ED her heart rate is in the 30s-40s.  EDP discussed the case with cardiology who recommended holding off her home metoprolol for washout, to correct electrolytes abnormalities, and to admit for observation.   Clinical Impression  Patient received in bed, she is agreeable to PT assessment. Patient required min A with supine to sit. Supervision for transfers and ambulation in room no AD. Patient HR stayed within normal limits throughout session although heart monitor sounding for Vtach and Afib during mobility. Patient will continue to benefit from skilled PT to improve mobility, strength and safety.        Recommendations for follow up therapy are one component of a multi-disciplinary discharge planning process, led by the attending physician.  Recommendations may be updated based on patient status, additional functional criteria and insurance authorization.  Follow Up Recommendations Home health PT    Assistance Recommended at Discharge Frequent or constant Supervision/Assistance  Patient can return home with the following  A little help with walking and/or transfers;A little help with bathing/dressing/bathroom;Assistance with cooking/housework;Assist for transportation;Help with stairs or ramp for entrance    Equipment Recommendations None recommended  by PT  Recommendations for Other Services       Functional Status Assessment Patient has had a recent decline in their functional status and demonstrates the ability to make significant improvements in function in a reasonable and predictable amount of time.     Precautions / Restrictions Precautions Precaution Comments: mod fall Restrictions Weight Bearing Restrictions: No      Mobility  Bed Mobility Overal bed mobility: Needs Assistance Bed Mobility: Supine to Sit     Supine to sit: Min assist     General bed mobility comments: min A to riase trunk to seated position    Transfers Overall transfer level: Needs assistance Equipment used: None Transfers: Sit to/from Stand Sit to Stand: Supervision                Ambulation/Gait Ambulation/Gait assistance: Supervision Gait Distance (Feet): 30 Feet Assistive device: None Gait Pattern/deviations: Step-through pattern Gait velocity: decr     General Gait Details: patient ambulating without AD, no lob. Weaker than baseline  Stairs            Wheelchair Mobility    Modified Rankin (Stroke Patients Only)       Balance Overall balance assessment: Mild deficits observed, not formally tested                                           Pertinent Vitals/Pain Pain Assessment Pain Assessment: No/denies pain    Home Living Family/patient expects to be discharged to:: Private residence Living Arrangements: Alone Available Help at Discharge: Family;Available 24 hours/day Type of Home: House  Home Equipment: Kasandra Knudsen - single point      Prior Function Prior Level of Function : Independent/Modified Independent             Mobility Comments: was not using AD prior to admission ADLs Comments: independent     Hand Dominance        Extremity/Trunk Assessment   Upper Extremity Assessment Upper Extremity Assessment: Overall WFL for tasks assessed    Lower Extremity  Assessment Lower Extremity Assessment: Generalized weakness    Cervical / Trunk Assessment Cervical / Trunk Assessment: Normal  Communication   Communication: No difficulties  Cognition Arousal/Alertness: Awake/alert Behavior During Therapy: WFL for tasks assessed/performed Overall Cognitive Status: Within Functional Limits for tasks assessed                                          General Comments      Exercises     Assessment/Plan    PT Assessment Patient needs continued PT services  PT Problem List Decreased strength;Decreased mobility;Decreased activity tolerance;Cardiopulmonary status limiting activity       PT Treatment Interventions Therapeutic exercise;Gait training;Balance training;Stair training;Functional mobility training;Therapeutic activities;Patient/family education    PT Goals (Current goals can be found in the Care Plan section)  Acute Rehab PT Goals Patient Stated Goal: to go home PT Goal Formulation: With patient Time For Goal Achievement: 03/18/22 Potential to Achieve Goals: Good    Frequency Min 2X/week     Co-evaluation               AM-PAC PT "6 Clicks" Mobility  Outcome Measure Help needed turning from your back to your side while in a flat bed without using bedrails?: A Little Help needed moving from lying on your back to sitting on the side of a flat bed without using bedrails?: A Little Help needed moving to and from a bed to a chair (including a wheelchair)?: A Little Help needed standing up from a chair using your arms (e.g., wheelchair or bedside chair)?: A Little Help needed to walk in hospital room?: A Little Help needed climbing 3-5 steps with a railing? : A Little 6 Click Score: 18    End of Session   Activity Tolerance: Patient tolerated treatment well Patient left: in chair;with call bell/phone within reach Nurse Communication: Mobility status PT Visit Diagnosis: Muscle weakness (generalized)  (M62.81)    Time: 1000-1033 PT Time Calculation (min) (ACUTE ONLY): 33 min   Charges:   PT Evaluation $PT Eval Moderate Complexity: 1 Mod PT Treatments $Gait Training: 8-22 mins        Chesky Heyer, PT, GCS 03/11/22,10:55 AM

## 2022-03-11 NOTE — Progress Notes (Signed)
*  PRELIMINARY RESULTS* Echocardiogram 2D Echocardiogram has been performed.  Sherrie Sport 03/11/2022, 8:44 AM

## 2022-03-11 NOTE — Assessment & Plan Note (Addendum)
High degree (Mobitz 2 & 3rd degree). Currently on low dose dopamine in SD. Cardio recommends Metoprolol wash out (wait 24-48 hrs) before decision for EP. HR in 40-50s at rest

## 2022-03-11 NOTE — Assessment & Plan Note (Signed)
Foley placed last night and draining

## 2022-03-11 NOTE — Assessment & Plan Note (Addendum)
Continue Losartan & HCTZ, prn hydralazine

## 2022-03-11 NOTE — Progress Notes (Signed)
    PATIENT: CARTHA ROTERT  LZJ:[id number]  DOB: [birth year]  Subjective: Nurse reported symptomatic bradycardia with rates in 30's Beside patient reports feeling "funny" "whoozy" and intermittent numbness arms and or legs with generalized weakness.  Objective: Vitals:   03/10/22 2330 03/10/22 2345  BP: (!) 156/57   Pulse: (!) 55 (!) 44  Resp: (!) 24 (!) 21  Temp: 98.1 F (36.7 C)   SpO2: 100% 100%     Assessment:  SB with PVC's, at times while monitoring in ICU a fib flutter with slow ventricular response BP stable Alert and oriented Skin color pink, UOP adequate  Plan:  Symptomatic bradycardia Transfer to stepdown Foley catheter for feeling of retention (ua with reflex cx upon foley insertion( Discussed with cardiologist Dr. Duaine Dredge, 5 mg given without any effeft. Started on dopamine at 2.5 mc/gkg/min, atropine 0.5 mg given Heart rate slowly improved sustaining in 40-50's and symptoms resolved Plan for transcutaneous pacing if needed and CCM aware of patient   Rosanne Sack L. Matilde Haymaker, MSN, CCRN, NP-C

## 2022-03-11 NOTE — Progress Notes (Signed)
Inpatient Diabetes Program Recommendations  AACE/ADA: New Consensus Statement on Inpatient Glycemic Control   Target Ranges:  Prepandial:   less than 140 mg/dL      Peak postprandial:   less than 180 mg/dL (1-2 hours)      Critically ill patients:  140 - 180 mg/dL    Latest Reference Range & Units 03/10/22 10:55 03/11/22 00:12 03/11/22 02:42  Glucose 70 - 99 mg/dL 249 (H) 199 (H) 245 (H)    Latest Reference Range & Units 03/10/14 11:20 03/11/22 02:42  Hemoglobin A1C 4.8 - 5.6 % 6.9 (H) 7.5 (H)   Review of Glycemic Control  Diabetes history: No Outpatient Diabetes medications: None Current orders for Inpatient glycemic control: None  Inpatient Diabetes Program Recommendations:    Insulin: Please consider ordering CBGs AC&HS with Novolog 0-9 units TID with meals and Novolog 0-5 units QHS.  HbgA1C:  A1C 7.5% on 03/11/22 and prior A1C in chart 6.9% on 03/10/14.  DM not noted in past medical hx. Will patient be newly dx with DM or does patient has DM hx?  NOTE: Patient admitted with bradycardia, dizziness. Noted symptomatic bradycardia last night around midnight and patient was given Dextrose and Glucagon at 00:33 today. Glucose 245 mg/dl at 2:42 am today.   Thanks, Barnie Alderman, RN, MSN, [place] Diabetes Coordinator Inpatient Diabetes Program [phone] (Team Pager from 8am to [place])

## 2022-03-11 NOTE — Progress Notes (Addendum)
  Progress Note   Patient: Susan [name] [id number]:[id number] DOB: 1932-05-01 DOA: 03/10/2022     0 DOS: the patient was seen and examined on 03/11/2022   Brief hospital course: 88 y.o. female with medical history significant for paroxysmal supraventricular tachycardia, OSA on CPAP, left renal mass, hypertension, hyperlipidemia, admitted for symptomatic (dizziness) bradycardia.  6/8: transferred to SD last night as HR in 30s - started on lose dose dopamine, required foley for urinary retention. Plan for    Assessment and Plan: DM type 2 with diabetic mixed hyperlipidemia (HCC) A1c 7.5 - continue SSI and lipitor for now.  Acute urinary retention Foley placed last night and draining  Heart block, AV High degree (Mobitz 2 & 3rd degree). Currently on low dose dopamine in SD. Cardio recommends Metoprolol wash out (wait 24-48 hrs) before decision for EP. HR in 40-50s at rest  TSH normal  PSVT (paroxysmal supraventricular tachycardia) (HCC) Was on metoprolol for this but is on hold due to bradycardia for now. Monitor on tele/SD  Hypertension Continue Losartan & HCTZ, prn hydralazine        Subjective: reports feeling tired. Woozy feeling.  Physical Exam: Vitals:   03/11/22 0700 03/11/22 0800 03/11/22 0840 03/11/22 0900  BP: (!) 146/104 (!) 150/48  (!) 150/53  Pulse: (!) 45 (!) 43 (!) 42 (!) 43  Resp: '17 19 16 19  '$ Temp:   98.3 F (36.8 C)   TempSrc:   Oral   SpO2: 96% 98% 97% 100%  Weight:      Height:       88 y.o. year-old female well developed well nourished in no acute distress.  Alert and oriented x3. Cardiovascular: Bradycardia with no rubs or gallops.  No thyromegaly or JVD noted.  No lower extremity edema.  Respiratory: Clear to auscultation with no wheezes or rales. Good inspiratory effort. Abdomen: Soft nontender nondistended with normal bowel sounds x4 quadrants. Muskuloskeletal: No cyanosis, clubbing or edema noted bilaterally Neuro: non-focal Skin: No ulcerative  lesions noted or rashes Psychiatry: Judgement and insight appear normal. Mood is appropriate for condition and setting  Data Reviewed:  K 3.2  Family Communication: none  Disposition: Status is: Inpatient Remains inpatient appropriate because: high degree AV block mgmt   Planned Discharge Destination: Home   DVT prophylaxis - Lovenox Time spent: 35 minutes  Author: Max Sane, MD 03/11/2022 10:53 AM  For on call review [email or web address].

## 2022-03-11 NOTE — Progress Notes (Addendum)
Progress Note  Patient Name: Susan [name] Date of Encounter: 03/11/2022  [facility] Cardiologist: Kathlyn Sacramento, MD   Subjective   Feels well this morning rate 40 to 50 bpm on low-dose dopamine around 3 mics Dopamine started overnight for rates in the 63s.  At that time she reported having some urinary retention, Foley catheter was placed Reports she has been fine since On further discussion reports having some general fatigue and low heart rates for around 3 weeks.  During that time went to the beach felt fine more recently in the past 2 days more symptomatic with fatigue  Inpatient Medications    Scheduled Meds:  aspirin EC  81 mg Oral Daily   atorvastatin  20 mg Oral Daily   Chlorhexidine Gluconate Cloth  6 each Topical Daily   enoxaparin (LOVENOX) injection  40 mg Subcutaneous Q24H   hydrochlorothiazide  25 mg Oral Daily   losartan  100 mg Oral QPM   Continuous Infusions:  DOPamine     lactated ringers 50 mL/hr at 03/11/22 0200   PRN Meds: acetaminophen, atropine, hydrALAZINE, melatonin, polyethylene glycol   Vital Signs    Vitals:   03/11/22 0300 03/11/22 0400 03/11/22 0500 03/11/22 0600  BP: (!) 140/54 (!) 146/48 (!) 139/47 (!) 134/52  Pulse: (!) 39 (!) 42 (!) 40 (!) 44  Resp: '18 20 17 18  '$ Temp:  97.7 F (36.5 C)    TempSrc:  Oral    SpO2: 97% 98% 94% 94%  Weight:   77.2 kg   Height:        Intake/Output Summary (Last 24 hours) at 03/11/2022 0900 Last data filed at 03/11/2022 0600 Gross per 24 hour  Intake 971.56 ml  Output 900 ml  Net 71.56 ml      03/11/2022    5:00 AM 03/10/2022   11:30 PM 03/10/2022    9:59 AM  Last 3 Weights  Weight (lbs) 170 lb 3.1 oz 170 lb 3.1 oz 187 lb 6.3 oz  Weight (kg) 77.2 kg 77.2 kg 85 kg      Telemetry    Sinus bradycardia rate 40 to 50 bpm- Personally Reviewed  ECG     - Personally Reviewed  Physical Exam   GEN: No acute distress.   Neck: No JVD Cardiac: brady, regular, no murmurs, rubs, or gallops.   Respiratory: Clear to auscultation bilaterally. GI: Soft, nontender, non-distended  MS: No edema; No deformity. Neuro:  Nonfocal  Psych: Normal affect   Labs    High Sensitivity Troponin:   Recent Labs  Lab 03/10/22 1055 03/10/22 2113 03/11/22 0012 03/11/22 0242  TROPONINIHS '6 10 17 '$ 68*     Chemistry Recent Labs  Lab 03/10/22 1055 03/11/22 0012 03/11/22 0242  NA 137 138 137  K 3.9 3.4* 3.2*  CL 107 107 107  CO2 21* 24 24  GLUCOSE 249* 199* 245*  BUN '20 21 20  '$ CREATININE 0.58 0.66 0.65  CALCIUM 8.8* 8.9 8.5*  MG  --  2.0 1.9  GFRNONAA >60 >60 >60  ANIONGAP '9 7 6    '$ Lipids No results for input(s): "CHOL", "TRIG", "HDL", "LABVLDL", "LDLCALC", "CHOLHDL" in the last 168 hours.  Hematology Recent Labs  Lab 03/10/22 1055 03/11/22 0242  WBC 6.3 12.5*  RBC 4.15 4.20  HGB 11.9* 12.2  HCT 37.7 37.1  MCV 90.8 88.3  MCH 28.7 29.0  MCHC 31.6 32.9  RDW 12.9 12.8  PLT 221 210   Thyroid  Recent Labs  Lab  03/11/22 0242  TSH 2.500    BNPNo results for input(s): "BNP", "PROBNP" in the last 168 hours.  DDimer No results for input(s): "DDIMER" in the last 168 hours.   Radiology    No results found.  Cardiac Studies   Prelim echocardiogram Normal ejection fraction  Patient Profile     Patient is an 88 year old female with history of hypertension, paroxysmal SVT, presenting with weakness, fatigue.Symptomatic bradycardia  Assessment & Plan    1.  High degree AV block(intermittent Mobitz 2 and third-degree), symptomatic bradycardia At home was taking metoprolol tartrate 50 3 times daily She took 50 mg on the way to the emergency room despite heart rate of 40 bpm Concern for vagal event in the setting of urine retention last night with lower heart rate in the 30s, improved on low-dose dopamine and with Foley catheter placement --Currently blood pressure in the 150 range heart rates 40-50 on rounds, mentating well --Discussed with hospitalist service and EP, plan  for additional 24 hours or more washout  2.  Hypertension -BP elevated -PTA losartan '100mg'$  qd HCTZ 25 mg daily We will avoid aggressive blood pressure management in the setting of bradycardia for now  3. P SVT Previously treated with metoprolol tartrate 50 3 times daily Metoprolol currently on hold in the setting of above For recurrent symptoms may need to be evaluated by EP If pacemaker is placed, this would be less of an issue  Case discussed with hospitalist service and EP  Total encounter time more than 50 minutes  Greater than 50% was spent in counseling and coordination of care with the patient     For questions or updates, please contact [place] Please consult [email or web address] for contact info under        Signed, Ida Rogue, MD  03/11/2022, 9:00 AM

## 2022-03-11 NOTE — Assessment & Plan Note (Signed)
A1c 7.5 - continue SSI and lipitor for now.

## 2022-03-11 NOTE — Hospital Course (Addendum)
Patient is an 88 year old female with medical history of PSVT, obstructive sleep apnea and hypertension who presented to the emergency room on 6/7 with complaints of dizziness x2 weeks and found to have a heart rate in the 30s to 40s.  Brought in for further evaluation.  Following admission, patient required transfer to to stepdown low-dose dopamine for sustained bradycardia.  Cardiology consulted patient felt to have high-grade AV block in the setting of treatment with metoprolol for her SVT and with metoprolol held, bradycardia improving, resolved.  Plan is for transfer to [facility] on 6/11 with placement of pacemaker on 6/12 by cardiology.

## 2022-03-11 NOTE — Evaluation (Signed)
Occupational Therapy Evaluation Patient Details Name: Susan [name] MRN: [id number] DOB: [birth year] Today's Date: 03/11/2022   History of Present Illness Susan [name] is a 88 y.o. female with medical history significant for paroxysmal supraventricular tachycardia, OSA on CPAP, left renal mass, hypertension, hyperlipidemia, who presented to [facility] ED with complaints of dizziness, with no falls for the past 2 weeks and worse this morning.  Patient endorses she has been taking home Lopressor 50 mg TID, she is unable to say for how long but states she has been on Lopressor for years.  Denies chest pain or anginal symptoms. Upon presentation to the ED her heart rate is in the 30s-40s.  EDP discussed the case with cardiology who recommended holding off her home metoprolol for washout, to correct electrolytes abnormalities, and to admit for observation.   Clinical Impression   Patient presenting with decreased Ind in self care, balance, functional mobility/transfers, endurance, and safety awareness. Patient reports being independent at baseline and living alone. Pt's son lives nearby and is able to assist her at discharge. Patient's HR is 40-50's while supine in bed. Min A for bed mobility and standing with min HHA for several steps. HR increasing to 112 bpm with activity. Pt returning to bed at end of session.  Patient will benefit from acute OT to increase overall independence in the areas of ADLs, functional mobility,  and safety awareness in order to safely discharge to next venue of care.      Recommendations for follow up therapy are one component of a multi-disciplinary discharge planning process, led by the attending physician.  Recommendations may be updated based on patient status, additional functional criteria and insurance authorization.   Follow Up Recommendations  Home health OT    Assistance Recommended at Discharge Intermittent Supervision/Assistance  Patient can return home with  the following A little help with walking and/or transfers;A little help with bathing/dressing/bathroom;Help with stairs or ramp for entrance;Assist for transportation;Assistance with cooking/housework    Functional Status Assessment  Patient has had a recent decline in their functional status and demonstrates the ability to make significant improvements in function in a reasonable and predictable amount of time.  Equipment Recommendations  Other (comment) (RW)       Precautions / Restrictions Precautions Precautions: None Precaution Comments: mod fall Restrictions Weight Bearing Restrictions: No      Mobility Bed Mobility Overal bed mobility: Needs Assistance Bed Mobility: Supine to Sit, Sit to Supine     Supine to sit: Min assist Sit to supine: Min assist   General bed mobility comments: for trunk support    Transfers Overall transfer level: Needs assistance Equipment used: None Transfers: Sit to/from Stand Sit to Stand: Supervision, Min guard                  Balance Overall balance assessment: Mild deficits observed, not formally tested                                         ADL either performed or assessed with clinical judgement   ADL Overall ADL's : Needs assistance/impaired     Grooming: Wash/dry hands;Wash/dry face;Sitting;Set up;Supervision/safety                   Toilet Transfer: Minimal assistance Toilet Transfer Details (indicate cue type and reason): stimulated         Functional mobility during  ADLs: Minimal assistance       Vision Patient Visual Report: No change from baseline              Pertinent Vitals/Pain Pain Assessment Pain Assessment: No/denies pain     Hand Dominance Right   Extremity/Trunk Assessment Upper Extremity Assessment Upper Extremity Assessment: Overall WFL for tasks assessed   Lower Extremity Assessment Lower Extremity Assessment: Generalized weakness   Cervical / Trunk  Assessment Cervical / Trunk Assessment: Normal   Communication Communication Communication: No difficulties   Cognition Arousal/Alertness: Awake/alert Behavior During Therapy: WFL for tasks assessed/performed Overall Cognitive Status: Within Functional Limits for tasks assessed                                                  Home Living Family/patient expects to be discharged to:: Private residence Living Arrangements: Alone Available Help at Discharge: Family;Available 24 hours/day Type of Home: House Home Access: Stairs to enter [employer] of Steps: 2 Entrance Stairs-Rails: Right Home Layout: One level     Bathroom Shower/Tub: Occupational psychologist: Standard     Home Equipment: Cane - single point          Prior Functioning/Environment Prior Level of Function : Independent/Modified Independent             Mobility Comments: was not using AD prior to admission ADLs Comments: independent        OT Problem List: Decreased strength;Decreased activity tolerance;Impaired balance (sitting and/or standing)      OT Treatment/Interventions: Self-care/ADL training;Balance training;Therapeutic exercise;Therapeutic activities;DME and/or AE instruction;Patient/family education;Manual therapy;Energy conservation    OT Goals(Current goals can be found in the care plan section) Acute Rehab OT Goals Patient Stated Goal: to get stronger and go home OT Goal Formulation: With patient Time For Goal Achievement: 03/25/22 Potential to Achieve Goals: Good ADL Goals Pt Will Perform Grooming: with supervision;standing Pt Will Perform Lower Body Dressing: with supervision;sit to/from stand Pt Will Transfer to Toilet: with supervision;ambulating Pt Will Perform Toileting - Clothing Manipulation and hygiene: with supervision;sit to/from stand  OT Frequency: Min 2X/week       AM-PAC OT "6 Clicks" Daily Activity     Outcome Measure Help  from another person eating meals?: None Help from another person taking care of personal grooming?: A Little Help from another person toileting, which includes using toliet, bedpan, or urinal?: A Little Help from another person bathing (including washing, rinsing, drying)?: A Little Help from another person to put on and taking off regular upper body clothing?: None Help from another person to put on and taking off regular lower body clothing?: A Little 6 Click Score: 20   End of Session Nurse Communication: Mobility status  Activity Tolerance: Patient tolerated treatment well Patient left: in bed;with call bell/phone within reach;with bed alarm set  OT Visit Diagnosis: Unsteadiness on feet (R26.81);Repeated falls (R29.6);Muscle weakness (generalized) (M62.81)                Time: 3570-1779 OT Time Calculation (min): 26 min Charges:  OT General Charges $OT Visit: 1 Visit OT Evaluation $OT Eval Low Complexity: 1 Low OT Treatments $Therapeutic Activity: 8-22 mins  Darleen Crocker, MS, OTR/L , CBIS ascom [phone]  03/11/22, 1:43 PM

## 2022-03-11 NOTE — Assessment & Plan Note (Signed)
Was on metoprolol for this but is on hold due to bradycardia for now. Monitor on tele/SD

## 2022-03-11 NOTE — TOC Initial Note (Signed)
Transition of Care [facility]) - Initial/Assessment Note    Patient Details  Name: Susan [name] MRN: [id number] Date of Birth: 06-05-1932  Transition of Care [facility]) CM/SW Contact:    Shelbie Hutching, RN Phone Number: 03/11/2022, 10:54 AM  Clinical Narrative:                 Patient admitted to the hospital for bradycardia.  RNCM met with patient at the bedside, introduced self and explained role.  Patient is from home where she lives alone, she is independent and drives.  Her son lives close by and she reports having a good support system.  Her son will be able to pick her up at discharge.  She is current with Dr. Fletcher Anon for Cardiology outpatient and her PCP is Dr. Hall Busing.   PT has recommended home health, patient says "please don't" when asked if she would like that set up.    No needs identified at this time.  TOC signed off.   Expected Discharge Plan: Home/Self Care Barriers to Discharge: Continued Medical Work up   Patient Goals and CMS Choice Patient states their goals for this hospitalization and ongoing recovery are:: Patient wants to get back home      Expected Discharge Plan and Services Expected Discharge Plan: Home/Self Care   Discharge Planning Services: CM Consult   Living arrangements for the past 2 months: Single Family Home                 DME Arranged: N/A DME Agency: NA       HH Arranged: Patient Refused [place] Agency: NA        Prior Living Arrangements/Services Living arrangements for the past 2 months: Single Family Home Lives with:: Self Patient language and need for interpreter reviewed:: Yes Do you feel safe going back to the place where you live?: Yes      Need for Family Participation in Patient Care: Yes (Comment) Care giver support system in place?: Yes (comment) (son and sister)   Criminal Activity/Legal Involvement Pertinent to Current Situation/Hospitalization: No - Comment as needed  Activities of Daily Living      Permission  Sought/Granted Permission sought to share information with : Family Supports Permission granted to share information with : Yes, Verbal Permission Granted              Emotional Assessment Appearance:: Appears younger than stated age Attitude/Demeanor/Rapport: Engaged Affect (typically observed): Accepting Orientation: : Oriented to Self, Oriented to Place, Oriented to  Time, Oriented to Situation Alcohol / Substance Use: Not Applicable Psych Involvement: No (comment)  Admission diagnosis:  Bradycardia [R00.1] Third degree AV block (HCC) [I44.2] Symptomatic bradycardia [R00.1] Patient Active Problem List   Diagnosis Date Noted   Acute urinary retention 03/11/2022   DM type 2 with diabetic mixed hyperlipidemia ([place]) 03/11/2022   Bradycardia 03/10/2022   Heart block, AV    Skin abrasion 03/06/2018   Asymptomatic varicose veins of both lower extremities 03/06/2018   Bilateral lower extremity edema 03/06/2018   PSVT (paroxysmal supraventricular tachycardia) ([place]) 11/07/2014   Tachycardia 03/15/2014   Hypertension    Nipple discharge 03/22/2013   Fibrocystic breast 03/22/2013   Personal history of colonic polyps 03/22/2013   PCP:  Albina Billet, MD Pharmacy:   [id number] [id number]: [phone]: [phone]    Social Determinants of Health (SDOH) Interventions    Readmission Risk  Interventions     No data to display

## 2022-03-11 NOTE — Assessment & Plan Note (Addendum)
A1c 7.5 - continue SSI and lipitor for now

## 2022-03-12 LAB — GLUCOSE, CAPILLARY
Glucose-Capillary: 152 mg/dL — ABNORMAL HIGH (ref 70–99)
Glucose-Capillary: 124 mg/dL — ABNORMAL HIGH (ref 70–99)
Glucose-Capillary: 135 mg/dL — ABNORMAL HIGH (ref 70–99)
Glucose-Capillary: 195 mg/dL — ABNORMAL HIGH (ref 70–99)

## 2022-03-12 MED ORDER — POTASSIUM CHLORIDE CRYS ER 20 MEQ PO TBCR
40.0000 meq | EXTENDED_RELEASE_TABLET | Freq: Once | ORAL | Status: AC
Start: 2022-03-12 — End: 2022-03-12
  Administered 2022-03-12: 40 meq via ORAL
  Filled 2022-03-12: qty 2

## 2022-03-12 NOTE — Progress Notes (Signed)
Progress Note  Patient Name: Susan [name] Date of Encounter: 03/12/2022  [facility] Cardiologist: Kathlyn Sacramento, MD   Subjective   Patient seen on AM rounds. Denies any chest pain or shortness of breath.Feeling better this morning with heart rate in the 40-50's. States she is ready to go home. Required no further dopamine. Has been SB with some mobitz type II throughout the night, no CHB noted on telemetry.   Inpatient Medications    Scheduled Meds:  aspirin EC  81 mg Oral Daily   atorvastatin  20 mg Oral Daily   Chlorhexidine Gluconate Cloth  6 each Topical Daily   enoxaparin (LOVENOX) injection  40 mg Subcutaneous Q24H   hydrochlorothiazide  25 mg Oral Daily   insulin aspart  0-5 Units Subcutaneous QHS   insulin aspart  0-9 Units Subcutaneous TID WC   losartan  100 mg Oral QPM   Continuous Infusions:  PRN Meds: acetaminophen, atropine, hydrALAZINE, melatonin, polyethylene glycol   Vital Signs    Vitals:   03/12/22 0044 03/12/22 0400 03/12/22 0500 03/12/22 0600  BP: (!) 174/59 (!) 121/53    Pulse: (!) 47 (!) 41 (!) 42 (!) 40  Resp: (!) '25 18 18 18  '$ Temp:   99.3 F (37.4 C)   TempSrc:   Oral   SpO2: 96% 93% 95% 96%  Weight:   78.3 kg   Height:        Intake/Output Summary (Last 24 hours) at 03/12/2022 0909 Last data filed at 03/11/2022 1800 Gross per 24 hour  Intake 1015.01 ml  Output 1230 ml  Net -214.99 ml      03/12/2022    5:00 AM 03/11/2022    5:00 AM 03/10/2022   11:30 PM  Last 3 Weights  Weight (lbs) 172 lb 9.9 oz 170 lb 3.1 oz 170 lb 3.1 oz  Weight (kg) 78.3 kg 77.2 kg 77.2 kg      Telemetry    SB rates 40-50's with some occasional Mobitz Type II - Personally Reviewed  ECG    No new tracings - Personally Reviewed  Physical Exam   GEN: No acute distress.   Neck: No JVD appreciated. Glasses on. Cardiac: RRR bradycardic , I-II/VI systolic murmur best heard at the RSB, without rubs, or gallops.  Respiratory: Clear to auscultation  bilaterally. Respirations are unlabored on room air GI: Soft, nontender, non-distended, bowel sounds positive in all 4 quadrants MS: No edema; No deformity. Neuro:  Nonfocal  Psych: Normal affect   Labs    High Sensitivity Troponin:   Recent Labs  Lab 03/10/22 1055 03/10/22 2113 03/11/22 0012 03/11/22 0242  TROPONINIHS '6 10 17 '$ 68*     Chemistry Recent Labs  Lab 03/10/22 1055 03/11/22 0012 03/11/22 0242  NA 137 138 137  K 3.9 3.4* 3.2*  CL 107 107 107  CO2 21* 24 24  GLUCOSE 249* 199* 245*  BUN '20 21 20  '$ CREATININE 0.58 0.66 0.65  CALCIUM 8.8* 8.9 8.5*  MG  --  2.0 1.9  GFRNONAA >60 >60 >60  ANIONGAP '9 7 6    '$ Lipids No results for input(s): "CHOL", "TRIG", "HDL", "LABVLDL", "LDLCALC", "CHOLHDL" in the last 168 hours.  Hematology Recent Labs  Lab 03/10/22 1055 03/11/22 0242  WBC 6.3 12.5*  RBC 4.15 4.20  HGB 11.9* 12.2  HCT 37.7 37.1  MCV 90.8 88.3  MCH 28.7 29.0  MCHC 31.6 32.9  RDW 12.9 12.8  PLT 221 210   Thyroid  Recent Labs  Lab 03/11/22 0242  TSH 2.500    BNPNo results for input(s): "BNP", "PROBNP" in the last 168 hours.  DDimer No results for input(s): "DDIMER" in the last 168 hours.   Radiology     Cardiac Studies  Echocardiogram completed on 03/11/2022 1. Left ventricular ejection fraction, by estimation, is 60 to 65%. The  left ventricle has normal function. The left ventricle has no regional  wall motion abnormalities. Left ventricular diastolic parameters are  consistent with Grade I diastolic  dysfunction (impaired relaxation).   2. Right ventricular systolic function is normal. The right ventricular  size is normal.   3. The mitral valve is normal in structure. Mild to moderate mitral valve  regurgitation. No evidence of mitral stenosis.   4. The aortic valve is normal in structure. Aortic valve regurgitation is  not visualized. Mild aortic valve stenosis.   5. The inferior vena cava is normal in size with greater than 50%   respiratory variability, suggesting right atrial pressure of 3 mmHg.   Patient Profile     88 y.o. female with a history of essential hypertension, paroxysmal supraventricular tachycardia, OSA on CPAP, left renal mass, and hyperlipidemia who is being seen for the evaluation of symptomatic bradycardia with Mobitz type II and transient CHB.  Assessment & Plan    High degree AV block (intermittent Mobitz type II and third degree), symptomatic bradycardia -previously had been taking metoprolol 50 mg tid, that has been held for washout -more SB noted on telemetry monitor overnight without much incidence of high grade AV blocks -patient has had improvement of symptoms -required no dopamine overnight, possible; vasovagal event the night before -echocardiogram revealed EF 60-65%, mild to mod MR, and mild AS -increase activity today to determine HR with activity and if she continues to experience symptoms and goes in and out of high grade AV block, if remains without symptoms and stays in sinus, it would be reasonable to have her follow up as outpatient with EP If needed, if continues to have episodes would likely benefit from permanent pacemaker placement  2. Essential Hypertension -blood pressure improved -continue HCTZ and losartan -vital sign per unit protocol  3. PSVT -previously treated with metoprolol, no on hold -if symptoms are persistent will need evaluation by EP -continue cardiac monitor  4.Hypokalemia -potassium 3.2 -ordered 40 mEq KCl -repeat BMP this afternoon -daily BMP       For questions or updates, please contact [place] Please consult [email or web address] for contact info under        Signed, Aminah Zabawa, NP  03/12/2022, 9:09 AM

## 2022-03-12 NOTE — Progress Notes (Signed)
  Progress Note   Patient: Susan [name] FXT:[id number] DOB: [birth year] DOA: 03/10/2022     1 DOS: the patient was seen and examined on 03/12/2022   Brief hospital course: 88 y.o. female with medical history significant for paroxysmal supraventricular tachycardia, OSA on CPAP, left renal mass, hypertension, hyperlipidemia, admitted for symptomatic (dizziness) bradycardia.  6/8: transferred to [place] last night as HR in 30s - started on lose dose dopamine, required foley for urinary retention.  6/9 : plan for pacemaker on Monday would likely transfer to [facility] over the weekend per cardiology.  Off dopamine and Foley discontinued   Assessment and Plan: DM type 2 with diabetic mixed hyperlipidemia (HCC) A1c 7.5 - continue SSI and lipitor for now  Acute urinary retention Voiding trial and removal of Foley  Heart block, AV High degree (Mobitz 2 & 3rd degree).  Dopamine turned off.  Cardiology would like to wait another 24 to 48 hours to monitor her off metoprolol.  May transfer to [facility] over the weekend with pacemaker on Monday per cardiology/Dr. Caryl Comes  TSH normal  PSVT (paroxysmal supraventricular tachycardia) ([place]) Was on metoprolol for this but is on hold due to bradycardia for now. Monitor on tele/SD.  Hypertension Continue Losartan & HCTZ, prn hydralazin        Subjective: Somewhat anxious is wanting to know if she is getting pacemaker.  No other symptoms  Physical Exam: Vitals:   03/12/22 0400 03/12/22 0500 03/12/22 0600 03/12/22 1113  BP: (!) 121/53   (!) 165/116  Pulse: (!) 41 (!) 42 (!) 40 80  Resp: '18 18 18 18  '$ Temp:  99.3 F (37.4 C)    TempSrc:  Oral    SpO2: 93% 95% 96% 100%  Weight:  78.3 kg    Height:       . 88 y.o.year-old femalewell developed well nourished in no acute distress. Alert and oriented x3. . Cardiovascular:Bradycardiawith no rubs or gallops. No thyromegaly or JVD noted. No lower extremity edema.  Marland Kitchen Respiratory: Clear to  auscultation with no wheezes or rales. Good inspiratory effort. . Abdomen: Soft nontender nondistended with normal bowel sounds x4 quadrants. . Muskuloskeletal: No cyanosis, clubbing or edema noted bilaterally . Neuro: non-focal . Skin: No ulcerative lesions noted or rashes . Psychiatry: Somewhat anxious .  Data Reviewed:  There are no new results to review at this time.  Family Communication: None  Disposition: Status is: Inpatient Remains inpatient appropriate because: Monitor on telemetry over the weekend.  EP/cardiology recommends continuing washout of metoprolol and reevaluate her on the weekend.  She may need transfer to [facility] on Sunday with plan for pacemaker on Monday per cardiology   Planned Discharge Destination: Home    DVT prophylaxis-Lovenox Time spent: 35 minutes  Author: Max Sane, MD 03/12/2022 2:46 PM  For on call review [email or web address].

## 2022-03-12 NOTE — Assessment & Plan Note (Signed)
Continue Losartan & HCTZ, prn hydralazin

## 2022-03-12 NOTE — Assessment & Plan Note (Signed)
Voiding trial and removal of Foley

## 2022-03-12 NOTE — Assessment & Plan Note (Signed)
Was on metoprolol for this but is on hold due to bradycardia for now. Monitor on tele/SD.

## 2022-03-12 NOTE — Plan of Care (Signed)
  Problem: Health Behavior/Discharge Planning: Goal: Ability to manage health-related needs will improve Outcome: Progressing   Problem: Clinical Measurements: Goal: Ability to maintain clinical measurements within normal limits will improve Outcome: Progressing   Problem: Clinical Measurements: Goal: Will remain free from infection Outcome: Progressing   Problem: Clinical Measurements: Goal: Respiratory complications will improve Outcome: Progressing   Problem: Clinical Measurements: Goal: Cardiovascular complication will be avoided Outcome: Progressing   Problem: Elimination: Goal: Will not experience complications related to urinary retention Outcome: Progressing   Problem: Pain Managment: Goal: General experience of comfort will improve Outcome: Progressing   Problem: Safety: Goal: Ability to remain free from injury will improve Outcome: Progressing

## 2022-03-12 NOTE — Progress Notes (Signed)
Physical Therapy Treatment Patient Details Name: Susan [name] MRN: [id number] DOB: [birth year] Today's Date: 03/12/2022   History of Present Illness Susan [name] is a 88 y.o. female with medical history significant for paroxysmal supraventricular tachycardia, OSA on CPAP, left renal mass, hypertension, hyperlipidemia, who presented to [facility] ED with complaints of dizziness, with no falls for the past 2 weeks and worse this morning.  Patient endorses she has been taking home Lopressor 50 mg TID, she is unable to say for how long but states she has been on Lopressor for years.  Denies chest pain or anginal symptoms. Upon presentation to the ED her heart rate is in the 30s-40s.  EDP discussed the case with cardiology who recommended holding off her home metoprolol for washout, to correct electrolytes abnormalities, and to admit for observation.    PT Comments    The pt is highly agreeable to therapy. She states that she really wants to get out of the bed and brush her teeth. Pt demonstrates good static balance in both sitting and standing, however with higher level dynamic balance activities requires closer supervision Although, starting the session with a HR of 50's her HR increases appropriately with physical activity. The pt continues to demonstrate safety to return home with family care and HHPT upon medical stability.     Recommendations for follow up therapy are one component of a multi-disciplinary discharge planning process, led by the attending physician.  Recommendations may be updated based on patient status, additional functional criteria and insurance authorization.  Follow Up Recommendations  Home health PT     Assistance Recommended at Discharge PRN  Patient can return home with the following A little help with bathing/dressing/bathroom;Help with stairs or ramp for entrance   Equipment Recommendations  None recommended by PT    Recommendations for Other Services        Precautions / Restrictions Precautions Precautions: None Restrictions Weight Bearing Restrictions: No     Mobility  Bed Mobility Overal bed mobility: Needs Assistance Bed Mobility: Supine to Sit     Supine to sit: HOB elevated, Supervision          Transfers Overall transfer level: Needs assistance Equipment used: None Transfers: Sit to/from Stand Sit to Stand: Supervision                Ambulation/Gait Ambulation/Gait assistance: Supervision Gait Distance (Feet): 20 Feet Assistive device: None Gait Pattern/deviations: Step-through pattern       General Gait Details: patient ambulating without AD, no lob.   Stairs             Wheelchair Mobility    Modified Rankin (Stroke Patients Only)       Balance Overall balance assessment: No apparent balance deficits (not formally assessed)                                          Cognition Arousal/Alertness: Awake/alert Behavior During Therapy: WFL for tasks assessed/performed Overall Cognitive Status: Within Functional Limits for tasks assessed                                          Exercises Other Exercises Other Exercises: Patient unable to independently donn socks seated at EOB. She ambulated to sink demonstrating good/normal static standing balance while performing  hygiene tasks. She is able to demonstrate good dynamic standing balance turning in multiple directions to allow for ease of line management by the PT.    General Comments        Pertinent Vitals/Pain Pain Assessment Pain Assessment: No/denies pain    Home Living                          Prior Function            PT Goals (current goals can now be found in the care plan section) Acute Rehab PT Goals Patient Stated Goal: to go home PT Goal Formulation: With patient Time For Goal Achievement: 03/18/22 Potential to Achieve Goals: Good Progress towards PT goals: Progressing  toward goals    Frequency    Min 2X/week      PT Plan Current plan remains appropriate    Co-evaluation              AM-PAC PT "6 Clicks" Mobility   Outcome Measure  Help needed turning from your back to your side while in a flat bed without using bedrails?: None Help needed moving from lying on your back to sitting on the side of a flat bed without using bedrails?: None Help needed moving to and from a bed to a chair (including a wheelchair)?: A Little Help needed standing up from a chair using your arms (e.g., wheelchair or bedside chair)?: A Little Help needed to walk in hospital room?: A Little Help needed climbing 3-5 steps with a railing? : A Little 6 Click Score: 20    End of Session   Activity Tolerance: Patient tolerated treatment well Patient left: in chair;with call bell/phone within reach Nurse Communication: Mobility status PT Visit Diagnosis: Muscle weakness (generalized) (M62.81)     Time: 5009-3818 PT Time Calculation (min) (ACUTE ONLY): 23 min  Charges:  $Therapeutic Activity: 23-37 mins                     11:22 AM, 03/12/22 Victorious Cosio A. Saverio Danker PT, DPT Physical Therapist - [facility]    Corderius Saraceni A Arrayah Connors 03/12/2022, 11:20 AM

## 2022-03-13 DIAGNOSIS — I441 Atrioventricular block, second degree: Secondary | ICD-10-CM

## 2022-03-13 DIAGNOSIS — I10 Essential (primary) hypertension: Secondary | ICD-10-CM

## 2022-03-13 LAB — BASIC METABOLIC PANEL
Anion gap: 11 (ref 5–15)
BUN: 21 mg/dL (ref 8–23)
CO2: 20 mmol/L — ABNORMAL LOW (ref 22–32)
Calcium: 9.3 mg/dL (ref 8.9–10.3)
Chloride: 107 mmol/L (ref 98–111)
Creatinine, Ser: 0.87 mg/dL (ref 0.44–1.00)
GFR, Estimated: >60 mL/min (ref >60)
Glucose, Bld: 302 mg/dL — ABNORMAL HIGH (ref 70–99)
Potassium: 4.1 mmol/L (ref 3.5–5.1)
Sodium: 138 mmol/L (ref 135–145)

## 2022-03-13 LAB — GLUCOSE, CAPILLARY
Glucose-Capillary: 143 mg/dL — ABNORMAL HIGH (ref 70–99)
Glucose-Capillary: 234 mg/dL — ABNORMAL HIGH (ref 70–99)
Glucose-Capillary: 169 mg/dL — ABNORMAL HIGH (ref 70–99)
Glucose-Capillary: 128 mg/dL — ABNORMAL HIGH (ref 70–99)

## 2022-03-13 NOTE — Assessment & Plan Note (Signed)
Required Foley transiently for about 24 hours and now removed

## 2022-03-13 NOTE — Progress Notes (Signed)
OT Cancellation Note  Patient Details Name: Susan [name] MRN: [id number] DOB: 28-Jun-1932   Cancelled Treatment:    Reason Eval/Treat Not Completed: Other (comment). Chart reviewed. Pt noted with HR in low to mid-40's. Pending transfer to [facility], probably tomorrow, for pacemaker placement on Monday. Will hold OT tx at this time and re-attempt once more medically appropriate.   Ardeth Perfect., MPH, MS, OTR/L ascom [phone] 03/13/22, 3:24 PM

## 2022-03-13 NOTE — Plan of Care (Signed)
Report indicated HR drops to mid 40s when asleep, but has been awake and HR mid 40s for past 1hr.  Pacer pads in place and will pace as needed.  Patient currently asymptomatic. Hosp and Cardio informed.

## 2022-03-13 NOTE — Assessment & Plan Note (Signed)
Continue Losartan & HCTZ,  hydralazin as needed

## 2022-03-13 NOTE — Progress Notes (Signed)
Progress Note  Patient Name: Susan [name] Date of Encounter: 03/13/2022  [facility] Cardiologist: Kathlyn Sacramento, MD   Subjective   Patient seen on AM rounds. Denies any chest pain or shortness of breath. Continued to have bradycardia during the night in the 40's with intermittent Mobitz Type II block and increased PVC's.   Inpatient Medications    Scheduled Meds:  aspirin EC  81 mg Oral Daily   atorvastatin  20 mg Oral Daily   enoxaparin (LOVENOX) injection  40 mg Subcutaneous Q24H   hydrochlorothiazide  25 mg Oral Daily   insulin aspart  0-5 Units Subcutaneous QHS   insulin aspart  0-9 Units Subcutaneous TID WC   losartan  100 mg Oral QPM   Continuous Infusions:  PRN Meds: acetaminophen, atropine, hydrALAZINE, melatonin, polyethylene glycol   Vital Signs    Vitals:   03/13/22 0335 03/13/22 0416 03/13/22 0417 03/13/22 0739  BP:   (!) 141/43 (!) 116/93  Pulse:  (!) 54 (!) 44 (!) 51  Resp:  18  17  Temp:  98.3 F (36.8 C)  98.1 F (36.7 C)  TempSrc:  Oral    SpO2:  93% 93% 98%  Weight: 76.9 kg     Height:        Intake/Output Summary (Last 24 hours) at 03/13/2022 0907 Last data filed at 03/12/2022 2200 Gross per 24 hour  Intake 360 ml  Output 1300 ml  Net -940 ml      03/13/2022    3:35 AM 03/12/2022    5:00 AM 03/11/2022    5:00 AM  Last 3 Weights  Weight (lbs) 169 lb 9.6 oz 172 lb 9.9 oz 170 lb 3.1 oz  Weight (kg) 76.93 kg 78.3 kg 77.2 kg      Telemetry    Sb-ST with intermittent Mobitz Type II with PVC's- Personally Reviewed  ECG    No new tracings- Personally Reviewed  Physical Exam   GEN: No acute distress.   Sitting in the recliner eating breakfast. Neck: No JVD appreciated Cardiac: RRR, I/VI systolic murmur, without rubs, or gallops.  Respiratory: Clear to auscultation bilaterally. Respirations are unlabored on room air. GI: Soft, nontender, non-distended  MS: No edema; No deformity. Neuro:  Nonfocal  Psych: Normal affect    Labs    High Sensitivity Troponin:   Recent Labs  Lab 03/10/22 1055 03/10/22 2113 03/11/22 0012 03/11/22 0242  TROPONINIHS '6 10 17 '$ 68*     Chemistry Recent Labs  Lab 03/10/22 1055 03/11/22 0012 03/11/22 0242  NA 137 138 137  K 3.9 3.4* 3.2*  CL 107 107 107  CO2 21* 24 24  GLUCOSE 249* 199* 245*  BUN '20 21 20  '$ CREATININE 0.58 0.66 0.65  CALCIUM 8.8* 8.9 8.5*  MG  --  2.0 1.9  GFRNONAA >60 >60 >60  ANIONGAP '9 7 6    '$ Lipids No results for input(s): "CHOL", "TRIG", "HDL", "LABVLDL", "LDLCALC", "CHOLHDL" in the last 168 hours.  Hematology Recent Labs  Lab 03/10/22 1055 03/11/22 0242  WBC 6.3 12.5*  RBC 4.15 4.20  HGB 11.9* 12.2  HCT 37.7 37.1  MCV 90.8 88.3  MCH 28.7 29.0  MCHC 31.6 32.9  RDW 12.9 12.8  PLT 221 210   Thyroid  Recent Labs  Lab 03/11/22 0242  TSH 2.500    BNPNo results for input(s): "BNP", "PROBNP" in the last 168 hours.  DDimer No results for input(s): "DDIMER" in the last 168 hours.   Radiology  No results found.  Cardiac Studies  Echocardiogram completed on 03/11/2022 1. Left ventricular ejection fraction, by estimation, is 60 to 65%. The  left ventricle has normal function. The left ventricle has no regional  wall motion abnormalities. Left ventricular diastolic parameters are  consistent with Grade I diastolic  dysfunction (impaired relaxation).   2. Right ventricular systolic function is normal. The right ventricular  size is normal.   3. The mitral valve is normal in structure. Mild to moderate mitral valve  regurgitation. No evidence of mitral stenosis.   4. The aortic valve is normal in structure. Aortic valve regurgitation is  not visualized. Mild aortic valve stenosis.   5. The inferior vena cava is normal in size with greater than 50%  respiratory variability, suggesting right atrial pressure of 3 mmHg.   Patient Profile     88 y.o. female with a history of essential, paroxysmal supraventricular tachycardia, OSA on  CPAP, left renal mass, and hyperlipidemia who is being seen for the evaluation of symptomatic bradycardia with Mobitz type II and transient CHB.  Assessment & Plan    High grade AV block (intermittent Mobitz type II and third degree heart block), symptomatic bradycardia -previously had been taking metoprolol 50 mg tid for PSVT which has been held since admission for beta blocker washout - more intermittent Mobitz type II overnight on telemetry -patient has had improvement of symptoms of tired and heavy legs and extreme fatigue -echocardiogram revealed EF 60-65%, mld to moderate MR, and mild AS -continue to increase activity as with movement heart rate is now climbing into the 50's -tentative plan for transfer to [facility] on Sunday for permanent pacemaker placement on Monday  2. Essential hypertension -blood pressure remains stable  -no other episodes requiring dopamine -continue HCTZ and losartan -vital signs per un it protocol  3. PSVT -previously treated with metoprolol which has been on hold since admission -continue telemetry monitor -tentative plan for PPM placement on Monday at [facility]  4. Hypokalemia -bmp ordered to evaluate electrolytes -increased amount of PVC's noted on telemetry -monitor, trend, and replete as needed      For questions or updates, please contact [place] Please consult [email or web address] for contact info under        Signed, Damonie Ellenwood, NP  03/13/2022, 9:07 AM

## 2022-03-13 NOTE — Progress Notes (Signed)
  Progress Note   Patient: Susan [name] YWV:[id number] DOB: May 19, 1932 DOA: 03/10/2022     2 DOS: the patient was seen and examined on 03/13/2022   Brief hospital course: 88 y.o. female with medical history significant for paroxysmal supraventricular tachycardia, OSA on CPAP, left renal mass, hypertension, hyperlipidemia, admitted for symptomatic (dizziness) bradycardia.  6/8: transferred to [place] last night as HR in 30s - started on lose dose dopamine, required foley for urinary retention.  6/9-6/10: plan for pacemaker on Monday. would likely transfer to [facility] tomorrow per cardiology.  Off dopamine and Foley discontinued   Assessment and Plan: DM type 2 with diabetic mixed hyperlipidemia (HCC) A1c 7.5 - continue SSI and lipitor for now  Acute urinary retention Required Foley transiently for about 24 hours and now removed  Heart block, AV High degree (Mobitz 2 & 3rd degree). Cardiology recommends transfer to [facility] tomorrow with pacemaker on Monday  TSH normal  PSVT (paroxysmal supraventricular tachycardia) ([place]) Was on metoprolol for this but is on hold due to bradycardia for now. Monitor on tele  Hypertension Continue Losartan & HCTZ,  hydralazin as needed        Subjective: Heart rate in 40s to 50s.  No symptoms.  Agreeable for transfer to: Tomorrow  Physical Exam: Vitals:   03/13/22 0416 03/13/22 0417 03/13/22 0739 03/13/22 1221  BP:  (!) 141/43 (!) 116/93 (!) 159/58  Pulse: (!) 54 (!) 44 (!) 51 (!) 44  Resp: '18  17 17  '$ Temp: 98.3 F (36.8 C)  98.1 F (36.7 C)   TempSrc: Oral     SpO2: 93% 93% 98% 97%  Weight:      Height:       . 88 y.o.year-old femalewell developed well nourished in no acute distress. Alert and oriented x3. . Cardiovascular:Bradycardiawith no rubs or gallops. No thyromegaly or JVD noted. No lower extremity edema.  Marland Kitchen Respiratory: Clear to auscultation with no wheezes or rales. Good inspiratory effort. . Abdomen: Soft nontender  nondistended with normal bowel sounds x4 quadrants. . Muskuloskeletal: No cyanosis, clubbing or edema noted bilaterally . Neuro:non-focal . Skin: No ulcerative lesions noted or rashes . Psychiatry: Normal mood and affect .  Data Reviewed:  There are no new results to review at this time.  Family Communication: None  Disposition: Status is: Inpatient Remains inpatient appropriate because: Waiting for transfer to [facility] tomorrow for permanent pacemaker placement on Monday   Planned Discharge Destination: Transfer to [facility] tomorrow for [facility] on Monday    DVT prophylaxis-Lovenox Time spent: 15 minutes  Author: Max Sane, MD 03/13/2022 12:52 PM  For on call review [email or web address].

## 2022-03-13 NOTE — Assessment & Plan Note (Signed)
Was on metoprolol for this but is on hold due to bradycardia for now. Monitor on tele

## 2022-03-13 NOTE — Assessment & Plan Note (Signed)
A1c 7.5 - continue SSI and lipitor for now

## 2022-03-13 NOTE — Assessment & Plan Note (Signed)
High degree (Mobitz 2 & 3rd degree). Cardiology recommends transfer to [facility] tomorrow with pacemaker on Monday  TSH normal

## 2022-03-14 DIAGNOSIS — I5032 Chronic diastolic (congestive) heart failure: Secondary | ICD-10-CM | POA: Diagnosis present

## 2022-03-14 DIAGNOSIS — E663 Overweight: Secondary | ICD-10-CM

## 2022-03-14 LAB — GLUCOSE, CAPILLARY
Glucose-Capillary: 177 mg/dL — ABNORMAL HIGH (ref 70–99)
Glucose-Capillary: 194 mg/dL — ABNORMAL HIGH (ref 70–99)
Glucose-Capillary: 137 mg/dL — ABNORMAL HIGH (ref 70–99)
Glucose-Capillary: 191 mg/dL — ABNORMAL HIGH (ref 70–99)

## 2022-03-14 LAB — BRAIN NATRIURETIC PEPTIDE: B Natriuretic Peptide: 47 pg/mL (ref 0.0–100.0)

## 2022-03-14 NOTE — Assessment & Plan Note (Signed)
High degree (Mobitz 2 & 3rd degree). Cardiology plans to transfer to [facility] today with pacemaker on Monday.  TSH is within normal limits.  TSH normal

## 2022-03-14 NOTE — Discharge Summary (Signed)
Physician Discharge Summary   Patient: Susan [name] MRN: [id number] DOB: [birth year]  Admit date:     03/10/2022  Discharge date: 03/14/22  Discharge Physician: Annita Brod   PCP: Albina Billet, MD   Recommendations at discharge:   Patient being transferred to [facility] to the cardiology service for pacemaker placement  Discharge Diagnoses: Active Problems:   Heart block, AV   Acute urinary retention   Chronic diastolic CHF (congestive heart failure) (HCC)   Hypertension   DM type 2 with diabetic mixed hyperlipidemia (HCC)   PSVT (paroxysmal supraventricular tachycardia) (HCC)   Overweight (BMI 25.0-29.9)  Resolved Problems:   * No resolved hospital problems. [facility] Course: Patient is an 88 year old female with medical history of PSVT, obstructive sleep apnea and hypertension who presented to the emergency room on 6/7 with complaints of dizziness x2 weeks and found to have a heart rate in the 30s to 40s.  Brought in for further evaluation.  Following admission, patient required transfer to to stepdown low-dose dopamine for sustained bradycardia.  Cardiology consulted patient felt to have high-grade AV block in the setting of treatment with metoprolol for her SVT and with metoprolol held, bradycardia improving, resolved.  Plan is for transfer to [facility] on 6/11 with placement of pacemaker on 6/12 by cardiology.  Assessment and Plan: Heart block, AV High degree (Mobitz 2 & 3rd degree). Cardiology plans to transfer to [facility] today with pacemaker on Monday.  TSH is within normal limits.  TSH normal  Acute urinary retention Required Foley transiently for about 24 hours and now removed.  Patient able to urinate without difficulty now.  Chronic diastolic CHF (congestive heart failure) (HCC) Echocardiogram done on 6/8 notes grade 1 diastolic dysfunction.  Given elevated blood pressures, suspect that she has had some now volume overload and have  started some gentle Lasix.  Hypertension Continue Losartan & HCTZ,  hydralazin as needed.  Blood pressure has been trending up, with systolic in the 924Q.  Suspect that she is likely having some acute on chronic diastolic heart failure.  Checking BNP and will start some gentle Lasix.  DM type 2 with diabetic mixed hyperlipidemia (HCC) A1c 7.5 - continue SSI and lipitor for now  PSVT (paroxysmal supraventricular tachycardia) ([place]) Was on metoprolol for this but is on hold due to bradycardia for now. Monitor on tele  Overweight (BMI 25.0-29.9) Meets criteria BMI greater than 25         Consultants: Cardiology Procedures performed: Echocardiogram noting grade 1 diastolic dysfunction Disposition:  [facility] Diet recommendation:  Discharge Diet Orders (From admission, onward)     Start     Ordered   03/14/22 0000  Diet - low sodium heart healthy        03/14/22 1328           NPO after midnight DISCHARGE MEDICATION: Allergies as of 03/14/2022       Reactions   No Known Allergies         Medication List     STOP taking these medications    metFORMIN 500 MG tablet Commonly known as: GLUCOPHAGE   metoprolol tartrate 50 MG tablet Commonly known as: LOPRESSOR       TAKE these medications    aspirin 81 MG tablet Take 81 mg by mouth daily.   atorvastatin 20 MG tablet Commonly known as: LIPITOR Take 20 mg by mouth daily.   hydrochlorothiazide 12.5 MG tablet Commonly known as:  HYDRODIURIL Take 12.5 mg by mouth daily.   losartan 100 MG tablet Commonly known as: COZAAR Take 100 mg by mouth every evening.   PreserVision AREDS 2 Caps Take 1 capsule by mouth in the morning and at bedtime. Lunch and late afternoon   VISINE DRY EYE OP Apply to eye as needed.        Discharge Exam: Filed Weights   03/12/22 0500 03/13/22 0335 03/14/22 0448  Weight: 78.3 kg 76.9 kg 78.6 kg   General: Alert and oriented x3, no acute distress Cardiovascular:  Regular rhythm, bradycardic  Condition at discharge: stable  The results of significant diagnostics from this hospitalization (including imaging, microbiology, ancillary and laboratory) are listed below for reference.   Imaging Studies: ECHOCARDIOGRAM COMPLETE  Result Date: 03/11/2022    ECHOCARDIOGRAM REPORT   Patient Name:   Susan [name] Date of Exam: 03/11/2022 Medical Rec #:  [id number]          Height:       67.0 in Accession #:    [id number]         Weight:       170.2 lb Date of Birth:  Oct 25, 1931           BSA:          1.888 m Patient Age:    28 years           BP:           134/52 mmHg Patient Gender: F                  HR:           44 bpm. Exam Location:  [facility] Procedure: 2D Echo, Cardiac Doppler and Color Doppler Indications:     Murmur R01.1  History:         Patient has no prior history of Echocardiogram examinations.                  Risk Factors:Hypertension and Dyslipidemia. Tachycardia.  Sonographer:     Sherrie Sport Referring Phys:  [id number] SHERI HAMMOCK Diagnosing Phys: Ida Rogue MD IMPRESSIONS  1. Left ventricular ejection fraction, by estimation, is 60 to 65%. The left ventricle has normal function. The left ventricle has no regional wall motion abnormalities. Left ventricular diastolic parameters are consistent with Grade I diastolic dysfunction (impaired relaxation).  2. Right ventricular systolic function is normal. The right ventricular size is normal.  3. The mitral valve is normal in structure. Mild to moderate mitral valve regurgitation. No evidence of mitral stenosis.  4. The aortic valve is normal in structure. Aortic valve regurgitation is not visualized. Mild aortic valve stenosis.  5. The inferior vena cava is normal in size with greater than 50% respiratory variability, suggesting right atrial pressure of 3 mmHg. FINDINGS  Left Ventricle: Left ventricular ejection fraction, by estimation, is 60 to 65%. The left ventricle has normal function. The left ventricle has no  regional wall motion abnormalities. The left ventricular internal cavity size was normal in size. There is  no left ventricular hypertrophy. Left ventricular diastolic parameters are consistent with Grade I diastolic dysfunction (impaired relaxation). Right Ventricle: The right ventricular size is normal. No increase in right ventricular wall thickness. Right ventricular systolic function is normal. Left Atrium: Left atrial size was normal in size. Right Atrium: Right atrial size was normal in size. Pericardium: Trivial pericardial effusion is present. Mitral Valve: The mitral valve is normal in structure. There is mild calcification  of the mitral valve leaflet(s). Mild to moderate mitral valve regurgitation. No evidence of mitral valve stenosis. MV peak gradient, 13.5 mmHg. The mean mitral valve gradient is 3.0 mmHg. Tricuspid Valve: The tricuspid valve is normal in structure. Tricuspid valve regurgitation is not demonstrated. No evidence of tricuspid stenosis. Aortic Valve: The aortic valve is normal in structure. Aortic valve regurgitation is not visualized. Mild aortic stenosis is present. Aortic valve mean gradient measures 8.7 mmHg. Aortic valve peak gradient measures 18.0 mmHg. Aortic valve area, by VTI measures 1.86 cm. Pulmonic Valve: The pulmonic valve was normal in structure. Pulmonic valve regurgitation is not visualized. No evidence of pulmonic stenosis. Aorta: The aortic root is normal in size and structure. Venous: The inferior vena cava is normal in size with greater than 50% respiratory variability, suggesting right atrial pressure of 3 mmHg. IAS/Shunts: No atrial level shunt detected by color flow Doppler.  LEFT VENTRICLE PLAX 2D LVIDd:         3.70 cm   Diastology LVIDs:         2.60 cm   LV e' medial:    8.16 cm/s LV PW:         1.00 cm   LV E/e' medial:  12.3 LV IVS:        0.85 cm   LV e' lateral:   14.70 cm/s LVOT diam:     2.00 cm   LV E/e' lateral: 6.8 LV SV:         79 LV SV Index:   42  LVOT Area:     3.14 cm  RIGHT VENTRICLE RV Basal diam:  2.70 cm RV S prime:     17.10 cm/s TAPSE (M-mode): 1.6 cm LEFT ATRIUM             Index        RIGHT ATRIUM           Index LA diam:        3.40 cm 1.80 cm/m   RA Area:     15.70 cm LA Vol (A2C):   40.6 ml 21.50 ml/m  RA Volume:   35.40 ml  18.75 ml/m LA Vol (A4C):   25.5 ml 13.50 ml/m LA Biplane Vol: 33.7 ml 17.85 ml/m  AORTIC VALVE                     PULMONIC VALVE AV Area (Vmax):    1.52 cm      PV Vmax:        1.03 m/s AV Area (Vmean):   1.67 cm      PV Vmean:       77.900 cm/s AV Area (VTI):     1.86 cm      PV VTI:         0.258 m AV Vmax:           212.33 cm/s   PV Peak grad:   4.2 mmHg AV Vmean:          132.667 cm/s  PV Mean grad:   3.0 mmHg AV VTI:            0.424 m       RVOT Peak grad: 7 mmHg AV Peak Grad:      18.0 mmHg AV Mean Grad:      8.7 mmHg LVOT Vmax:         103.00 cm/s LVOT Vmean:        70.400 cm/s LVOT VTI:  0.251 m LVOT/AV VTI ratio: 0.59  AORTA Ao Root diam: 3.00 cm MITRAL VALVE                TRICUSPID VALVE MV Area (PHT): 1.96 cm     TR Peak grad:   22.8 mmHg MV Area VTI:   1.26 cm     TR Vmax:        239.00 cm/s MV Peak grad:  13.5 mmHg MV Mean grad:  3.0 mmHg     SHUNTS MV Vmax:       1.84 m/s     Systemic VTI:  0.25 m MV Vmean:      67.7 cm/s    Systemic Diam: 2.00 cm MV Decel Time: 388 msec     Pulmonic VTI:  0.364 m MV E velocity: 100.00 cm/s MV A velocity: 129.00 cm/s MV E/A ratio:  0.78 Ida Rogue MD Electronically signed by Ida Rogue MD Signature Date/Time: 03/11/2022/12:37:29 PM    Final     Microbiology: Results for orders placed or performed during the hospital encounter of 03/10/22  MRSA Next Gen by PCR, Nasal     Status: None   Collection Time: 03/10/22 11:41 PM   Specimen: Nasal Swab  Result Value Ref Range Status   MRSA by PCR Next Gen NOT DETECTED NOT DETECTED Final    Comment: (NOTE) The GeneXpert MRSA Assay (FDA approved for NASAL specimens only), is one component of a  comprehensive MRSA colonization surveillance program. It is not intended to diagnose MRSA infection nor to guide or monitor treatment for MRSA infections. Test performance is not FDA approved in patients less than 71 years old. Performed at [facility], [address] [place], [place] [address]     Labs: CBC: Recent Labs  Lab 03/10/22 1055 03/11/22 0242  WBC 6.3 12.5*  NEUTROABS 3.2  --   HGB 11.9* 12.2  HCT 37.7 37.1  MCV 90.8 88.3  PLT 221 676   Basic Metabolic Panel: Recent Labs  Lab 03/10/22 1055 03/11/22 0012 03/11/22 0242 03/13/22 0937  NA 137 138 137 138  K 3.9 3.4* 3.2* 4.1  CL 107 107 107 107  CO2 21* 24 24 20*  GLUCOSE 249* 199* 245* 302*  BUN '20 21 20 21  '$ CREATININE 0.58 0.66 0.65 0.87  CALCIUM 8.8* 8.9 8.5* 9.3  MG  --  2.0 1.9  --   PHOS  --   --  3.7  --    Liver Function Tests: No results for input(s): "AST", "ALT", "ALKPHOS", "BILITOT", "PROT", "ALBUMIN" in the last 168 hours. CBG: Recent Labs  Lab 03/13/22 1219 03/13/22 1641 03/13/22 2110 03/14/22 0736 03/14/22 1215  GLUCAP 234* 169* 128* 177* 194*    Discharge time spent: less than 30 minutes.  Signed: Annita Brod, MD [facility] 03/14/2022

## 2022-03-14 NOTE — TOC Progression Note (Signed)
Transition of Care [facility]) - Progression Note    Patient Details  Name: Susan [name] MRN: [id number] Date of Birth: [birth year]  Transition of Care [facility]) CM/SW Contact  Izola Price, RN Phone Number: 03/14/2022, 1:45 PM  Clinical Narrative:   Patient is pending transfer to [facility] facility in [place] for pacemaker inplant per provider notes. Simmie Davies RN CM     Expected Discharge Plan: Home/Self Care Barriers to Discharge: Continued Medical Work up  Expected Discharge Plan and Services Expected Discharge Plan: Home/Self Care   Discharge Planning Services: CM Consult   Living arrangements for the past 2 months: Single Family Home Expected Discharge Date: 03/13/22               DME Arranged: N/A DME Agency: NA       HH Arranged: Patient Refused [place] Agency: NA         Social Determinants of Health (SDOH) Interventions    Readmission Risk Interventions     No data to display

## 2022-03-14 NOTE — Assessment & Plan Note (Signed)
A1c 7.5 - continue SSI and lipitor for now

## 2022-03-14 NOTE — Progress Notes (Deleted)
[facility] Progress Note  Patient: Susan [name]    CNO:[id number]  DOA: 03/10/2022    Date of Service: the patient was seen and examined on 03/14/2022  Brief hospital course: Patient is an 88 year old female with medical history of PSVT, obstructive sleep apnea and hypertension who presented to the emergency room on 6/7 with complaints of dizziness x2 weeks and found to have a heart rate in the 30s to 40s.  Brought in for further evaluation.  Following admission, patient required transfer to to stepdown low-dose dopamine for sustained bradycardia.  Cardiology consulted patient felt to have high-grade AV block in the setting of treatment with metoprolol for her SVT and with metoprolol held, bradycardia improving, resolved.  Plan is for transfer to [facility] on 6/11 with placement of pacemaker on 6/12 by cardiology.  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 Cardiology plans to transfer to [facility] today with pacemaker on Monday.  TSH is within normal limits.  TSH normal  Acute urinary retention Required Foley transiently for about 24 hours and now removed.  Patient able to urinate without difficulty now.  Chronic diastolic CHF (congestive heart failure) (HCC) Echocardiogram done on 6/8 notes grade 1 diastolic dysfunction.  Given elevated blood pressures, suspect that she has had some now volume overload and have started some gentle Lasix.  Hypertension Continue Losartan & HCTZ,  hydralazin as needed.  Blood pressure has been trending up, with systolic in the 294T.  Suspect that she is likely having some acute on chronic diastolic heart failure.  Checking BNP and will start some gentle Lasix.  DM type 2 with diabetic mixed hyperlipidemia (HCC) A1c 7.5 - continue SSI and lipitor for now  PSVT (paroxysmal supraventricular tachycardia) ([place]) Was on metoprolol for this but is on hold due to bradycardia for now. Monitor on  tele  Overweight (BMI 25.0-29.9) Meets criteria BMI greater than 25       Body mass index is 27.14 kg/m.        Consultants: Cardiology  Procedures: Echocardiogram noting grade 1 diastolic dysfunction  Antimicrobials: None  Code Status: Full code   Subjective: Patient doing okay with no complaints  Objective: Noted blood pressures have been trending upward Vitals:   03/14/22 0737 03/14/22 1216  BP: 115/77 (!) 184/102  Pulse: (!) 56 (!) 45  Resp: 17 18  Temp: 97.8 F (36.6 C)   SpO2: 100% 95%    Intake/Output Summary (Last 24 hours) at 03/14/2022 1324 Last data filed at 03/14/2022 0700 Gross per 24 hour  Intake 480 ml  Output --  Net 480 ml   Filed Weights   03/12/22 0500 03/13/22 0335 03/14/22 0448  Weight: 78.3 kg 76.9 kg 78.6 kg   Body mass index is 27.14 kg/m.  Exam:  General: Alert and oriented x3, no acute distress HEENT: Normocephalic atraumatic, mucous membranes are moist Cardiovascular: Bradycardic, regular rhythm Respiratory: Clear to auscultation bilaterally Abdomen: Soft, nontender, nondistended, positive bowel sounds Musculoskeletal: No clubbing or cyanosis, trace pitting edema Skin: No skin breaks, tears or lesions Psychiatry: Appropriate, no evidence of psychoses Neurology: No focal deficits  Data Reviewed: Noted slightly elevated CBGs  Disposition:  Status is: Inpatient Remains inpatient appropriate because: Transfer to [facility] for pacemaker    Anticipated discharge date: 6/13  Remaining issues to be resolved so that patient can be discharged:  -Transfer to [facility] to cardiology service -Placement of pacemaker -Diuresis   Family Communication:  Left message for daughter DVT Prophylaxis: enoxaparin (LOVENOX) injection 40 mg Start: 03/10/22 2200    Author: Annita Brod ,MD 03/14/2022 1:24 PM  To reach On-call, see care teams to locate the attending and reach out via [email or web address]. Between 7PM-7AM, please  contact night-coverage If you still have difficulty reaching the attending provider, please page the [facility] (Director on Call) for [facility] on amion for assistance.

## 2022-03-14 NOTE — Assessment & Plan Note (Signed)
Echocardiogram done on 6/8 notes grade 1 diastolic dysfunction.  Given elevated blood pressures, suspect that she has had some now volume overload and have started some gentle Lasix.

## 2022-03-14 NOTE — Assessment & Plan Note (Signed)
Meets criteria BMI greater than 25 

## 2022-03-14 NOTE — Assessment & Plan Note (Signed)
Was on metoprolol for this but is on hold due to bradycardia for now. Monitor on tele

## 2022-03-14 NOTE — Progress Notes (Signed)
Progress Note  Patient Name: Susan [name] Date of Encounter: 03/14/2022  [facility] Cardiologist: Kathlyn Sacramento, MD   Subjective   Reports that she feels okay this morning Ambulating short distances, denies near syncope/lightheadedness Blood pressure stable, heart rate trending around 40 bpm, second-degree AV block type II, periods concerning for higher degree AV block, possibly third-degree with junctional escape  Inpatient Medications    Scheduled Meds:  aspirin EC  81 mg Oral Daily   atorvastatin  20 mg Oral Daily   enoxaparin (LOVENOX) injection  40 mg Subcutaneous Q24H   hydrochlorothiazide  25 mg Oral Daily   insulin aspart  0-5 Units Subcutaneous QHS   insulin aspart  0-9 Units Subcutaneous TID WC   losartan  100 mg Oral QPM   Continuous Infusions:   PRN Meds: acetaminophen, atropine, hydrALAZINE, melatonin, polyethylene glycol   Vital Signs    Vitals:   03/14/22 0040 03/14/22 0447 03/14/22 0448 03/14/22 0737  BP: (!) 169/72 (!) 158/47  115/77  Pulse: (!) 47 (!) 46  (!) 56  Resp: '20 20  17  '$ Temp: 98.2 F (36.8 C) 98.3 F (36.8 C)  97.8 F (36.6 C)  TempSrc:      SpO2: 98% 96%  100%  Weight:   78.6 kg   Height:        Intake/Output Summary (Last 24 hours) at 03/14/2022 1130 Last data filed at 03/14/2022 0700 Gross per 24 hour  Intake 480 ml  Output --  Net 480 ml      03/14/2022    4:48 AM 03/13/2022    3:35 AM 03/12/2022    5:00 AM  Last 3 Weights  Weight (lbs) 173 lb 4.5 oz 169 lb 9.6 oz 172 lb 9.9 oz  Weight (kg) 78.6 kg 76.93 kg 78.3 kg      Telemetry    Normal sinus rhythm with second-degree AV block 40 bpm- Personally Reviewed  ECG     - Personally Reviewed  Physical Exam   Constitutional:  oriented to person, place, and time. No distress.  HENT:  Head: Grossly normal Eyes:  no discharge. No scleral icterus.  Neck: No JVD, no carotid bruits  Cardiovascular: Regular rate and rhythm, no murmurs  appreciated Pulmonary/Chest: Clear to auscultation bilaterally, no wheezes or rails Abdominal: Soft.  no distension.  no tenderness.  Musculoskeletal: Normal range of motion Neurological:  normal muscle tone. Coordination normal. No atrophy Skin: Skin warm and dry Psychiatric: normal affect, pleasant  Labs    High Sensitivity Troponin:   Recent Labs  Lab 03/10/22 1055 03/10/22 2113 03/11/22 0012 03/11/22 0242  TROPONINIHS '6 10 17 '$ 68*     Chemistry Recent Labs  Lab 03/11/22 0012 03/11/22 0242 03/13/22 0937  NA 138 137 138  K 3.4* 3.2* 4.1  CL 107 107 107  CO2 24 24 20*  GLUCOSE 199* 245* 302*  BUN '21 20 21  '$ CREATININE 0.66 0.65 0.87  CALCIUM 8.9 8.5* 9.3  MG 2.0 1.9  --   GFRNONAA >60 >60 >60  ANIONGAP '7 6 11    '$ Lipids No results for input(s): "CHOL", "TRIG", "HDL", "LABVLDL", "LDLCALC", "CHOLHDL" in the last 168 hours.  Hematology Recent Labs  Lab 03/10/22 1055 03/11/22 0242  WBC 6.3 12.5*  RBC 4.15 4.20  HGB 11.9* 12.2  HCT 37.7 37.1  MCV 90.8 88.3  MCH 28.7 29.0  MCHC 31.6 32.9  RDW 12.9 12.8  PLT 221 210   Thyroid  Recent Labs  Lab 03/11/22  0242  TSH 2.500    BNPNo results for input(s): "BNP", "PROBNP" in the last 168 hours.  DDimer No results for input(s): "DDIMER" in the last 168 hours.   Radiology    No results found.  Cardiac Studies   echocardiogram  1. Left ventricular ejection fraction, by estimation, is 60 to 65%. The  left ventricle has normal function. The left ventricle has no regional  wall motion abnormalities. Left ventricular diastolic parameters are  consistent with Grade I diastolic  dysfunction (impaired relaxation).   2. Right ventricular systolic function is normal. The right ventricular  size is normal.   3. The mitral valve is normal in structure. Mild to moderate mitral valve  regurgitation. No evidence of mitral stenosis.   4. The aortic valve is normal in structure. Aortic valve regurgitation is  not  visualized. Mild aortic valve stenosis.   5. The inferior vena cava is normal in size with greater than 50%  respiratory variability, suggesting right atrial pressure of 3 mmHg.   Patient Profile     Patient is an 88 year old female with history of hypertension, paroxysmal SVT, presenting with weakness, fatigue.Symptomatic bradycardia  Assessment & Plan    1.  High degree AV block(intermittent Mobitz 2 and third-degree), symptomatic bradycardia At home was taking metoprolol tartrate 50 3 times daily She has completed full metoprolol washout, continues to have high degree AV block, heart rate of 40 bpm second-degree AV block, concern for third-degree -Case discussed with the EP, plan for pacer We will look to transfer to [facility] for potential device placement tomorrow as their schedule permits  2.  Hypertension Blood pressure somewhat labile Recommend we continue losartan '100mg'$  qd, HCTZ 25 mg daily Permissive blood pressure in the setting of bradycardia  3. P SVT Previously treated with metoprolol tartrate '50mg'$  3 times daily Metoprolol currently on hold in the setting of above Once pacemaker in place, could consider medication options for recurrent episodes  4.  Frequent PVCs Noted on telemetry, asymptomatic  Case discussed with hospitalist service and EP  Total encounter time more than 50 minutes  Greater than 50% was spent in counseling and coordination of care with the patient     For questions or updates, please contact [place] Please consult [email or web address] for contact info under        Signed, Ida Rogue, MD  03/14/2022, 11:30 AM

## 2022-03-14 NOTE — Assessment & Plan Note (Signed)
Required Foley transiently for about 24 hours and now removed.  Patient able to urinate without difficulty now.

## 2022-03-14 NOTE — Assessment & Plan Note (Addendum)
Continue Losartan & HCTZ,  hydralazin as needed.  Blood pressure has been trending up, with systolic in the 503U.  Suspect that she is likely having some acute on chronic diastolic heart failure.  Checking BNP and will start some gentle Lasix.

## 2022-03-15 ENCOUNTER — Ambulatory Visit (HOSPITAL_COMMUNITY)
Admission: AD | Admit: 2022-03-15 | Discharge: 2022-03-16 | Disposition: A | Discharge status: Home / Self Care | Payer: Medicare Other | Source: Other Acute Inpatient Hospital | Attending: Cardiology | Admitting: Cardiology

## 2022-03-15 ENCOUNTER — Encounter (HOSPITAL_COMMUNITY)
Admission: AD | Disposition: A | Discharge status: Home / Self Care | Payer: Self-pay | Source: Other Acute Inpatient Hospital | Attending: Cardiology

## 2022-03-15 ENCOUNTER — Ambulatory Visit (HOSPITAL_COMMUNITY): Admit: 2022-03-15 | Payer: Medicare Other | Admitting: Cardiology

## 2022-03-15 DIAGNOSIS — R001 Bradycardia, unspecified: Secondary | ICD-10-CM | POA: Insufficient documentation

## 2022-03-15 DIAGNOSIS — I442 Atrioventricular block, complete: Principal | ICD-10-CM | POA: Diagnosis present

## 2022-03-15 DIAGNOSIS — G4733 Obstructive sleep apnea (adult) (pediatric): Secondary | ICD-10-CM | POA: Insufficient documentation

## 2022-03-15 DIAGNOSIS — E785 Hyperlipidemia, unspecified: Secondary | ICD-10-CM | POA: Insufficient documentation

## 2022-03-15 DIAGNOSIS — I471 Supraventricular tachycardia: Secondary | ICD-10-CM | POA: Insufficient documentation

## 2022-03-15 DIAGNOSIS — Z7982 Long term (current) use of aspirin: Secondary | ICD-10-CM | POA: Insufficient documentation

## 2022-03-15 DIAGNOSIS — I10 Essential (primary) hypertension: Secondary | ICD-10-CM | POA: Insufficient documentation

## 2022-03-15 HISTORY — PX: PACEMAKER IMPLANT: EP1218

## 2022-03-15 LAB — GLUCOSE, CAPILLARY
Glucose-Capillary: 160 mg/dL — ABNORMAL HIGH (ref 70–99)
Glucose-Capillary: 205 mg/dL — ABNORMAL HIGH (ref 70–99)
Glucose-Capillary: 128 mg/dL — ABNORMAL HIGH (ref 70–99)
Glucose-Capillary: 180 mg/dL — ABNORMAL HIGH (ref 70–99)

## 2022-03-15 LAB — SURGICAL PCR SCREEN
MRSA, PCR: NEGATIVE
Staphylococcus aureus: NEGATIVE

## 2022-03-15 SURGERY — PACEMAKER IMPLANT

## 2022-03-15 MED ORDER — ONDANSETRON HCL 4 MG/2ML IJ SOLN
4.0000 mg | Freq: Four times a day (QID) | INTRAMUSCULAR | Status: DC | PRN
Start: 2022-03-15 — End: 2022-03-16

## 2022-03-15 MED ORDER — SODIUM CHLORIDE 0.9 % IV SOLN: INTRAVENOUS | Status: DC

## 2022-03-15 MED ORDER — MUPIROCIN 2 % EX OINT
1.0000 "application " | TOPICAL_OINTMENT | Freq: Two times a day (BID) | CUTANEOUS | Status: DC
  Administered 2022-03-15: 1 via NASAL

## 2022-03-15 MED ORDER — CEFAZOLIN SODIUM-DEXTROSE 2-4 GM/100ML-% IV SOLN
2.0000 g | INTRAVENOUS | Status: AC
  Administered 2022-03-15: 2 g via INTRAVENOUS

## 2022-03-15 MED ORDER — HEPARIN (PORCINE) IN NACL 1000-0.9 UT/500ML-% IV SOLN
INTRAVENOUS | Status: DC | PRN
  Administered 2022-03-15: 500 mL

## 2022-03-15 MED ORDER — ACETAMINOPHEN 325 MG PO TABS: 325.0000 mg | ORAL_TABLET | ORAL | Status: DC | PRN

## 2022-03-15 MED ORDER — LOSARTAN POTASSIUM 50 MG PO TABS
100.0000 mg | ORAL_TABLET | Freq: Every day | ORAL | Status: DC
  Administered 2022-03-15: 100 mg via ORAL
  Filled 2022-03-15: qty 2

## 2022-03-15 MED ORDER — MUPIROCIN 2 % EX OINT
TOPICAL_OINTMENT | CUTANEOUS | Status: AC
  Filled 2022-03-15: qty 22

## 2022-03-15 MED ORDER — LIDOCAINE HCL (PF) 1 % IJ SOLN
INTRAMUSCULAR | Status: DC | PRN
  Administered 2022-03-15: 30 mL

## 2022-03-15 MED ORDER — SODIUM CHLORIDE 0.9 % IV SOLN
80.0000 mg | INTRAVENOUS | Status: AC
  Administered 2022-03-15: 80 mg

## 2022-03-15 MED ORDER — METOPROLOL TARTRATE 25 MG PO TABS
25.0000 mg | ORAL_TABLET | Freq: Two times a day (BID) | ORAL | Status: DC
  Administered 2022-03-15: 25 mg via ORAL
  Filled 2022-03-15: qty 1

## 2022-03-15 MED ORDER — ATORVASTATIN CALCIUM 10 MG PO TABS
20.0000 mg | ORAL_TABLET | Freq: Every day | ORAL | Status: DC
Start: 2022-03-16 — End: 2022-03-16
  Administered 2022-03-16: 20 mg via ORAL
  Filled 2022-03-15: qty 2

## 2022-03-15 MED ORDER — CHLORHEXIDINE GLUCONATE 4 % EX LIQD
4.0000 "application " | Freq: Once | CUTANEOUS | Status: DC
  Filled 2022-03-15: qty 60

## 2022-03-15 MED ORDER — POLYVINYL ALCOHOL 1.4 % OP SOLN: 1.0000 [drp] | OPHTHALMIC | Status: DC | PRN

## 2022-03-15 MED ORDER — HYDROCHLOROTHIAZIDE 12.5 MG PO TABS
12.5000 mg | ORAL_TABLET | Freq: Every day | ORAL | Status: DC
Start: 2022-03-16 — End: 2022-03-16
  Administered 2022-03-16: 12.5 mg via ORAL
  Filled 2022-03-15: qty 1

## 2022-03-15 SURGICAL SUPPLY — 14 items
CABLE SURGICAL S-101-97-12 (CABLE) ×2 IMPLANT
CATH CPS LOCATOR 3D MED (CATHETERS) ×1 IMPLANT
HELIX LOCKING TOOL (MISCELLANEOUS) ×2
LEAD TENDRIL MRI 52CM LPA1200M (Lead) ×1 IMPLANT
LEAD TENDRIL SDX 2088TC-58CM (Lead) ×1 IMPLANT
PACEMAKER ASSURITY DR-RF (Pacemaker) ×1 IMPLANT
PAD DEFIB RADIO PHYSIO CONN (PAD) ×2 IMPLANT
SHEATH 8FR PRELUDE SNAP 13 (SHEATH) ×1 IMPLANT
SHEATH 9FR PRELUDE SNAP 13 (SHEATH) ×1 IMPLANT
SHEATH PROBE COVER 6X72 (BAG) ×1 IMPLANT
SLITTER AGILIS HISPRO (INSTRUMENTS) ×1 IMPLANT
TOOL HELIX LOCKING (MISCELLANEOUS) IMPLANT
TRAY PACEMAKER INSERTION (PACKS) ×2 IMPLANT
WIRE HI TORQ VERSACORE-J 145CM (WIRE) ×1 IMPLANT

## 2022-03-15 NOTE — Care Management Important Message (Signed)
Important Message  Patient Details  Name: Susan [name] MRN: [id number] Date of Birth: 04-13-1932   Medicare Important Message Given:  Other (see comment)  Pt. Transferring to [facility] today for pacemaker.   [name] 03/15/2022, 10:42 AM

## 2022-03-15 NOTE — H&P (Signed)
Consult note to serve as H&P dated March 15, 2022.  Lysbeth Galas T. Quentin Ore, MD, [facility], [facility] Cardiac Electrophysiology

## 2022-03-15 NOTE — Progress Notes (Signed)
Patient arrived to cath lab holding area bay 6 per EMS. Patient A/O x3; denies distress. IV patent to left forearm with normal saline infusing. #22g Iv inserted without difficulty to right forearm. Dr. Quentin Ore to see patient. New orders received. PCR screening performed. Assisted patient to bathroom. Patient transported via stretcher to Cath lab 2 for Permanent Pacemaker; consent obtained.Cindee Salt

## 2022-03-15 NOTE — Progress Notes (Signed)
Progress Note  Patient Name: Susan [name] Date of Encounter: 03/15/2022  [facility] Cardiologist: Kathlyn Sacramento, MD   Subjective   Patient seen on AM rounds. Denies any chest pain or shortness of breath.Continues to remain in second degree block Mobitz Type II on cardiac monitor.  Inpatient Medications    Scheduled Meds:  aspirin EC  81 mg Oral Daily   atorvastatin  20 mg Oral Daily   enoxaparin (LOVENOX) injection  40 mg Subcutaneous Q24H   hydrochlorothiazide  25 mg Oral Daily   insulin aspart  0-5 Units Subcutaneous QHS   insulin aspart  0-9 Units Subcutaneous TID WC   losartan  100 mg Oral QPM   Continuous Infusions:  PRN Meds: acetaminophen, atropine, hydrALAZINE, melatonin, polyethylene glycol   Vital Signs    Vitals:   03/14/22 2035 03/15/22 0022 03/15/22 0456 03/15/22 0750  BP: (!) 165/65 (!) 148/44 (!) 153/52 (!) 139/58  Pulse: (!) 51 (!) 44 (!) 44 (!) 43  Resp: '18 16 18 18  '$ Temp: 98.1 F (36.7 C) 98.5 F (36.9 C) 98.2 F (36.8 C)   TempSrc: Oral  Oral   SpO2: 100% 95% 96% 96%  Weight:      Height:        Intake/Output Summary (Last 24 hours) at 03/15/2022 0954 Last data filed at 03/14/2022 1856 Gross per 24 hour  Intake 240 ml  Output --  Net 240 ml      03/14/2022    4:48 AM 03/13/2022    3:35 AM 03/12/2022    5:00 AM  Last 3 Weights  Weight (lbs) 173 lb 4.5 oz 169 lb 9.6 oz 172 lb 9.9 oz  Weight (kg) 78.6 kg 76.93 kg 78.3 kg      Telemetry    SB, trigeminy, Mobitz Typ II heart block with intermittent third degree block - Personally Reviewed  ECG    No new tracings - Personally Reviewed  Physical Exam   GEN: No acute distress.   Neck: No JVD Cardiac: RRR, I/VI systolic murmur, without rubs, or gallops.  Respiratory: Clear to auscultation bilaterally. Respirations are unlabored on room air.  GI: Soft, nontender, non-distended  MS: Trace non-pitting edema; No deformity. Neuro:  Nonfocal  Psych: Normal affect   Labs     High Sensitivity Troponin:   Recent Labs  Lab 03/10/22 1055 03/10/22 2113 03/11/22 0012 03/11/22 0242  TROPONINIHS '6 10 17 '$ 68*     Chemistry Recent Labs  Lab 03/11/22 0012 03/11/22 0242 03/13/22 0937  NA 138 137 138  K 3.4* 3.2* 4.1  CL 107 107 107  CO2 24 24 20*  GLUCOSE 199* 245* 302*  BUN '21 20 21  '$ CREATININE 0.66 0.65 0.87  CALCIUM 8.9 8.5* 9.3  MG 2.0 1.9  --   GFRNONAA >60 >60 >60  ANIONGAP '7 6 11    '$ Lipids No results for input(s): "CHOL", "TRIG", "HDL", "LABVLDL", "LDLCALC", "CHOLHDL" in the last 168 hours.  Hematology Recent Labs  Lab 03/10/22 1055 03/11/22 0242  WBC 6.3 12.5*  RBC 4.15 4.20  HGB 11.9* 12.2  HCT 37.7 37.1  MCV 90.8 88.3  MCH 28.7 29.0  MCHC 31.6 32.9  RDW 12.9 12.8  PLT 221 210   Thyroid  Recent Labs  Lab 03/11/22 0242  TSH 2.500    BNP Recent Labs  Lab 03/14/22 1402  BNP 47.0    DDimer No results for input(s): "DDIMER" in the last 168 hours.   Radiology  No results found.  Cardiac Studies  Echocardiogram completed on 03/11/2022 1. Left ventricular ejection fraction, by estimation, is 60 to 65%. The  left ventricle has normal function. The left ventricle has no regional  wall motion abnormalities. Left ventricular diastolic parameters are  consistent with Grade I diastolic  dysfunction (impaired relaxation).   2. Right ventricular systolic function is normal. The right ventricular  size is normal.   3. The mitral valve is normal in structure. Mild to moderate mitral valve  regurgitation. No evidence of mitral stenosis.   4. The aortic valve is normal in structure. Aortic valve regurgitation is  not visualized. Mild aortic valve stenosis.   5. The inferior vena cava is normal in size with greater than 50%  respiratory variability, suggesting right atrial pressure of 3 mmHg.   Patient Profile     88 y.o. female with a history of essential hypertension, paroxysmal supraventricular tachycardia, OSA on CPAP, left  renal mass, and hyperlipidemia, who is being evaluated and seen for Mobitz type II heart block and transient complete heart block.  Assessment & Plan    High grade AV block (Mobitz type II and intermittent  CHB) -previously had been on metoprolol 50 mg tid for PSVT that has been on hold for 5 days, adequate washout -continues to have Mobitz type II block with transient CHB on the cardiac monitor -does endorse some symptom improvement  -echocardiogram revealed EF 60-65%, mild to moderate MR, and mild AS -continued to increase activity -awaiting transfer to [facility] to have EP place permanent pacemaker  2. Essential hypertension -blood pressure improved -continue HCTZ and losartan -vital signs per unit protocol  3.PSVT -previously treated with metoprolol, currently remains on hold -awaiting evaluation by EP -continue cardiac monitor  4.Hypokalemia -potassium 4.1 on 03/13/22 -keep level closer to 4 -daily BMP -monitor/trend/and replete as needed      For questions or updates, please contact [place] Please consult [email or web address] for contact info under        Signed, Jasyah Theurer, NP  03/15/2022, 9:54 AM

## 2022-03-15 NOTE — Consult Note (Signed)
ELECTROPHYSIOLOGY CONSULT NOTE    Patient ID: Susan [name] MRN: [id number], DOB/AGE: Sep 01, 1932 88 y.o.  Admit date: 03/15/2022 Date of Consult: 03/15/2022  Primary Physician: Albina Billet, MD Primary Cardiologist: Kathlyn Sacramento, MD  Electrophysiologist:  New  Referring Provider: Dr. Rockey Situ  Patient Profile: Susan [name] is a 88 y.o. female with a history of HTN, Paroxysmal SVT managed on BB, OSA on  CPAP, HLD, and h/o left renal mass who is being seen today for the evaluation of symptomatic second degree AV block at the request of Dr. Rockey Situ.  HPI:  Susan [name] is a 88 y.o. female with medical history as above who presented to [facility] 03/10/2022 with complains of bilateral LE edema, weakness, dizziness, and HR in the 40s.  Telemetry and EKG concerning for advanced AV block with intermittent second and third degree HB. IT was recommended to have her wash out her metoprolol, which did not result in improvement of her conduction  Echo 03/11/2022 showed LVEF 60-65%.  Transferred to [facility] today for evaluation by EP and PPM implantation   Currently, she denies chest pain, palpitations, dyspnea, PND, orthopnea, nausea, vomiting, dizziness, syncope, edema, weight gain, or early satiety.  Past Medical History:  Diagnosis Date   Colon polyp    Diffuse cystic mastopathy    Hyperlipidemia    Hypertension    Macular degeneration    Tachycardia      Surgical History:  Past Surgical History:  Procedure Laterality Date   ABDOMINAL HYSTERECTOMY     APPENDECTOMY     BREAST BIOPSY Bilateral 2006   benign, clip markers in both breasts   COLONOSCOPY  05/03/2015   EYE SURGERY Bilateral 2014   cataract   salpingo oophorectmy       Medications Prior to Admission  Medication Sig Dispense Refill Last Dose   aspirin 81 MG tablet Take 81 mg by mouth daily.      atorvastatin (LIPITOR) 20 MG tablet Take 20 mg by mouth daily.      Glycerin-Hypromellose-PEG 400 (VISINE DRY EYE OP)  Apply to eye as needed.      hydrochlorothiazide (HYDRODIURIL) 12.5 MG tablet Take 12.5 mg by mouth daily.  3    losartan (COZAAR) 100 MG tablet Take 100 mg by mouth every evening.  3    Multiple Vitamins-Minerals (PRESERVISION AREDS 2) CAPS Take 1 capsule by mouth in the morning and at bedtime. Lunch and late afternoon       Inpatient Medications:    Allergies:  Allergies  Allergen Reactions   No Known Allergies     Social History   Socioeconomic History   Marital status: Divorced    Spouse name: Not on file   Number of children: Not on file   Years of education: Not on file   Highest education level: Not on file  Occupational History   Not on file  Tobacco Use   Smoking status: Never   Smokeless tobacco: Never  Vaping Use   Vaping Use: Never used  Substance and Sexual Activity   Alcohol use: No   Drug use: No   Sexual activity: Not on file  Other Topics Concern   Not on file  Social History Narrative   Not on file   Social Determinants of Health   Financial Resource Strain: Not on file  Food Insecurity: Not on file  Transportation Needs: Not on file  Physical Activity: Not on file  Stress: Not on file  Social Connections: Not on  file  Intimate Partner Violence: Not on file     Family History  Problem Relation Age of Onset   Lung cancer Father    Kidney cancer Maternal Aunt    Breast cancer Neg Hx      Review of Systems: All other systems reviewed and are otherwise negative except as noted above.  Physical Exam: There were no vitals filed for this visit.  GEN- The patient is well appearing, alert and oriented x 3 today.   HEENT: normocephalic, atraumatic; sclera clear, conjunctiva pink; hearing intact; oropharynx clear; neck supple Lungs- Clear to ausculation bilaterally, normal work of breathing.  No wheezes, rales, rhonchi Heart- Regular rate and rhythm, no murmurs, rubs or gallops GI- soft, non-tender, non-distended, bowel sounds  present Extremities- no clubbing, cyanosis, or edema; DP/PT/radial pulses 2+ bilaterally MS- no significant deformity or atrophy Skin- warm and dry, no rash or lesion Psych- euthymic mood, full affect Neuro- strength and sensation are intact  Labs:   Lab Results  Component Value Date   WBC 12.5 (H) 03/11/2022   HGB 12.2 03/11/2022   HCT 37.1 03/11/2022   MCV 88.3 03/11/2022   PLT 210 03/11/2022    Recent Labs  Lab 03/13/22 0937  NA 138  K 4.1  CL 107  CO2 20*  BUN 21  CREATININE 0.87  CALCIUM 9.3  GLUCOSE 302*      Radiology/Studies: ECHOCARDIOGRAM COMPLETE  Result Date: 03/11/2022    ECHOCARDIOGRAM REPORT   Patient Name:   Susan [name] Date of Exam: 03/11/2022 Medical Rec #:  [id number]          Height:       67.0 in Accession #:    [id number]         Weight:       170.2 lb Date of Birth:  Mar 21, 1932           BSA:          1.888 m Patient Age:    15 years           BP:           134/52 mmHg Patient Gender: F                  HR:           44 bpm. Exam Location:  [facility] Procedure: 2D Echo, Cardiac Doppler and Color Doppler Indications:     Murmur R01.1  History:         Patient has no prior history of Echocardiogram examinations.                  Risk Factors:Hypertension and Dyslipidemia. Tachycardia.  Sonographer:     Sherrie Sport Referring Phys:  [id number] SHERI HAMMOCK Diagnosing Phys: Ida Rogue MD IMPRESSIONS  1. Left ventricular ejection fraction, by estimation, is 60 to 65%. The left ventricle has normal function. The left ventricle has no regional wall motion abnormalities. Left ventricular diastolic parameters are consistent with Grade I diastolic dysfunction (impaired relaxation).  2. Right ventricular systolic function is normal. The right ventricular size is normal.  3. The mitral valve is normal in structure. Mild to moderate mitral valve regurgitation. No evidence of mitral stenosis.  4. The aortic valve is normal in structure. Aortic valve regurgitation is not  visualized. Mild aortic valve stenosis.  5. The inferior vena cava is normal in size with greater than 50% respiratory variability, suggesting right atrial pressure of 3 mmHg. FINDINGS  Left Ventricle:  Left ventricular ejection fraction, by estimation, is 60 to 65%. The left ventricle has normal function. The left ventricle has no regional wall motion abnormalities. The left ventricular internal cavity size was normal in size. There is  no left ventricular hypertrophy. Left ventricular diastolic parameters are consistent with Grade I diastolic dysfunction (impaired relaxation). Right Ventricle: The right ventricular size is normal. No increase in right ventricular wall thickness. Right ventricular systolic function is normal. Left Atrium: Left atrial size was normal in size. Right Atrium: Right atrial size was normal in size. Pericardium: Trivial pericardial effusion is present. Mitral Valve: The mitral valve is normal in structure. There is mild calcification of the mitral valve leaflet(s). Mild to moderate mitral valve regurgitation. No evidence of mitral valve stenosis. MV peak gradient, 13.5 mmHg. The mean mitral valve gradient is 3.0 mmHg. Tricuspid Valve: The tricuspid valve is normal in structure. Tricuspid valve regurgitation is not demonstrated. No evidence of tricuspid stenosis. Aortic Valve: The aortic valve is normal in structure. Aortic valve regurgitation is not visualized. Mild aortic stenosis is present. Aortic valve mean gradient measures 8.7 mmHg. Aortic valve peak gradient measures 18.0 mmHg. Aortic valve area, by VTI measures 1.86 cm. Pulmonic Valve: The pulmonic valve was normal in structure. Pulmonic valve regurgitation is not visualized. No evidence of pulmonic stenosis. Aorta: The aortic root is normal in size and structure. Venous: The inferior vena cava is normal in size with greater than 50% respiratory variability, suggesting right atrial pressure of 3 mmHg. IAS/Shunts: No atrial level  shunt detected by color flow Doppler.  LEFT VENTRICLE PLAX 2D LVIDd:         3.70 cm   Diastology LVIDs:         2.60 cm   LV e' medial:    8.16 cm/s LV PW:         1.00 cm   LV E/e' medial:  12.3 LV IVS:        0.85 cm   LV e' lateral:   14.70 cm/s LVOT diam:     2.00 cm   LV E/e' lateral: 6.8 LV SV:         79 LV SV Index:   42 LVOT Area:     3.14 cm  RIGHT VENTRICLE RV Basal diam:  2.70 cm RV S prime:     17.10 cm/s TAPSE (M-mode): 1.6 cm LEFT ATRIUM             Index        RIGHT ATRIUM           Index LA diam:        3.40 cm 1.80 cm/m   RA Area:     15.70 cm LA Vol (A2C):   40.6 ml 21.50 ml/m  RA Volume:   35.40 ml  18.75 ml/m LA Vol (A4C):   25.5 ml 13.50 ml/m LA Biplane Vol: 33.7 ml 17.85 ml/m  AORTIC VALVE                     PULMONIC VALVE AV Area (Vmax):    1.52 cm      PV Vmax:        1.03 m/s AV Area (Vmean):   1.67 cm      PV Vmean:       77.900 cm/s AV Area (VTI):     1.86 cm      PV VTI:         0.258 m AV Vmax:  212.33 cm/s   PV Peak grad:   4.2 mmHg AV Vmean:          132.667 cm/s  PV Mean grad:   3.0 mmHg AV VTI:            0.424 m       RVOT Peak grad: 7 mmHg AV Peak Grad:      18.0 mmHg AV Mean Grad:      8.7 mmHg LVOT Vmax:         103.00 cm/s LVOT Vmean:        70.400 cm/s LVOT VTI:          0.251 m LVOT/AV VTI ratio: 0.59  AORTA Ao Root diam: 3.00 cm MITRAL VALVE                TRICUSPID VALVE MV Area (PHT): 1.96 cm     TR Peak grad:   22.8 mmHg MV Area VTI:   1.26 cm     TR Vmax:        239.00 cm/s MV Peak grad:  13.5 mmHg MV Mean grad:  3.0 mmHg     SHUNTS MV Vmax:       1.84 m/s     Systemic VTI:  0.25 m MV Vmean:      67.7 cm/s    Systemic Diam: 2.00 cm MV Decel Time: 388 msec     Pulmonic VTI:  0.364 m MV E velocity: 100.00 cm/s MV A velocity: 129.00 cm/s MV E/A ratio:  0.78 Ida Rogue MD Electronically signed by Ida Rogue MD Signature Date/Time: 03/11/2022/12:37:29 PM    Final     EKG: on arrival shows poor quality with possible CHB vs sinus bradycardia  (personally reviewed)  Review of baseline EKG shows likely bifascicular block as below.   TELEMETRY: by report showed sinus brady, trigemy and intermittent Second degree and CHB, rates in 40s (personally reviewed)  Assessment/Plan: High grade AV block (Mobitz type II and intermittent  CHB) RBBB at baseline with borderline 1st degree AV block and ? LAFB. At least bifascicular block.  Has not improved with wash out of metoprolol Explained risks, benefits, and alternatives to PPM implantation, including but not limited to bleeding, infection, pneumothorax, pericardial effusion, lead dislodgement, heart attack, stroke, or death.  Pt verbalized understanding and agrees to proceed  Echo this admission EF 60-65%   2. HTN Resume BB post pacer   3.PSVT Resume BB post pacer   Dr. Quentin Ore has seen. Plan for DDD PPM this afternoon.    For questions or updates, please contact [place] Please consult [email or web address] for contact info under Cardiology/STEMI.  Jacalyn Lefevre, PA-C  03/15/2022 2:15 PM

## 2022-03-15 NOTE — Final Progress Note (Signed)
Patient is finally heading to [facility] for permanent pacemaker placement.  Please see Dr. Lyman Speller dictated discharge summary yesterday for further details.  Patient was waiting for bed availability since yesterday.

## 2022-03-16 ENCOUNTER — Ambulatory Visit (HOSPITAL_COMMUNITY): Payer: Medicare Other

## 2022-03-16 ENCOUNTER — Encounter (HOSPITAL_COMMUNITY): Payer: Self-pay | Admitting: Cardiology

## 2022-03-16 DIAGNOSIS — I10 Essential (primary) hypertension: Secondary | ICD-10-CM | POA: Diagnosis not present

## 2022-03-16 DIAGNOSIS — Z7982 Long term (current) use of aspirin: Secondary | ICD-10-CM | POA: Diagnosis not present

## 2022-03-16 DIAGNOSIS — I442 Atrioventricular block, complete: Secondary | ICD-10-CM | POA: Diagnosis not present

## 2022-03-16 DIAGNOSIS — R001 Bradycardia, unspecified: Secondary | ICD-10-CM | POA: Diagnosis not present

## 2022-03-16 LAB — GLUCOSE, CAPILLARY: Glucose-Capillary: 164 mg/dL — ABNORMAL HIGH (ref 70–99)

## 2022-03-16 LAB — BASIC METABOLIC PANEL
Anion gap: 7 (ref 5–15)
BUN: 14 mg/dL (ref 8–23)
CO2: 24 mmol/L (ref 22–32)
Calcium: 9 mg/dL (ref 8.9–10.3)
Chloride: 108 mmol/L (ref 98–111)
Creatinine, Ser: 0.71 mg/dL (ref 0.44–1.00)
GFR, Estimated: >60 mL/min (ref >60)
Glucose, Bld: 128 mg/dL — ABNORMAL HIGH (ref 70–99)
Potassium: 3.8 mmol/L (ref 3.5–5.1)
Sodium: 139 mmol/L (ref 135–145)

## 2022-03-16 MED ORDER — ASPIRIN 81 MG PO TABS: 81.0000 mg | ORAL_TABLET | Freq: Every day | ORAL | Status: DC

## 2022-03-16 MED ORDER — METOPROLOL TARTRATE 50 MG PO TABS: 50.0000 mg | ORAL_TABLET | Freq: Two times a day (BID) | ORAL | 6 refills | Status: DC

## 2022-03-16 MED ORDER — METOPROLOL TARTRATE 50 MG PO TABS
50.0000 mg | ORAL_TABLET | Freq: Two times a day (BID) | ORAL | Status: DC
  Administered 2022-03-16: 50 mg via ORAL
  Filled 2022-03-16: qty 1

## 2022-03-16 MED ORDER — ACETAMINOPHEN 325 MG PO TABS: 325.0000 mg | ORAL_TABLET | ORAL | Status: AC | PRN

## 2022-03-16 NOTE — Discharge Summary (Signed)
ELECTROPHYSIOLOGY PROCEDURE DISCHARGE SUMMARY    Patient ID: Susan [name],  MRN: [id number], DOB/AGE: [birth year] 88 y.o.  Admit date: 03/15/2022 Discharge date: 03/16/2022  Primary Care Physician: Albina Billet, MD  Primary Cardiologist: Kathlyn Sacramento, MD  Electrophysiologist: Dr. Quentin Ore  Primary Discharge Diagnosis:  Symptomatic bradycardia status post pacemaker implantation this admission  Secondary Discharge Diagnosis:  HTN  No Known Allergies   Procedures This Admission:  1.  Implantation of a St. Jude dual chamber PPM on 03/15/2022 by Dr. Quentin Ore. The patient received a St. Jude model number [id number] PPM with model number [id number] right atrial lead and 2088TC right ventricular lead. There were no immediate post procedure complications. 2.  CXR on 03/16/22 demonstrated no pneumothorax status post device implantation.   Brief HPI: Susan [name] is a 88 y.o. female was admitted for symptomatic bradycardia and electrophysiology team asked to see for consideration of PPM implantation. She awaited wash out of her metoprolol at [place], and then was transferred to [facility] for procedure. Past medical history includes above.  The patient has had symptomatic bradycardia without reversible causes identified.  Risks, benefits, and alternatives to PPM implantation were reviewed with the patient who wished to proceed.   Hospital Course:  The patient was admitted and underwent implantation of a St. Jude dual chamber PPM with details as outlined above.  She was monitored on telemetry overnight which demonstrated appropriate pacing.  Left chest was without hematoma or ecchymosis.  The device was interrogated and found to be functioning normally.  CXR was obtained and demonstrated no pneumothorax status post device implantation.  Wound care, arm mobility, and restrictions were reviewed with the patient.  The patient was examined and considered stable for discharge to home.    Pt not on  anticoagulation, other than a baby ASA.   Physical Exam: Vitals:   03/15/22 2200 03/15/22 2300 03/16/22 0455 03/16/22 0456  BP:  (!) 171/78 (!) 166/79   Pulse: 83     Resp: (!) 23 20 (!) 22   Temp:   98.6 F (37 C)   TempSrc:   Oral   SpO2: 95% 96%  96%  Weight:   75.6 kg     GEN- The patient is well appearing, alert and oriented x 3 today.   HEENT: normocephalic, atraumatic; sclera clear, conjunctiva pink; hearing intact; oropharynx clear; neck supple, no JVP Lymph- no cervical lymphadenopathy Lungs- Clear to ausculation bilaterally, normal work of breathing.  No wheezes, rales, rhonchi Heart- Regular rate and rhythm, no murmurs, rubs or gallops, PMI not laterally displaced GI- soft, non-tender, non-distended, bowel sounds present, no hepatosplenomegaly Extremities- no clubbing, cyanosis, or edema; DP/PT/radial pulses 2+ bilaterally MS- no significant deformity or atrophy Skin- warm and dry, no rash or lesion, left chest without hematoma/ecchymosis Psych- euthymic mood, full affect Neuro- strength and sensation are intact   Labs:   Lab Results  Component Value Date   WBC 12.5 (H) 03/11/2022   HGB 12.2 03/11/2022   HCT 37.1 03/11/2022   MCV 88.3 03/11/2022   PLT 210 03/11/2022    Recent Labs  Lab 03/16/22 0302  NA 139  K 3.8  CL 108  CO2 24  BUN 14  CREATININE 0.71  CALCIUM 9.0  GLUCOSE 128*    Discharge Medications:  Allergies as of 03/16/2022   No Known Allergies      Medication List     TAKE these medications    acetaminophen 325 MG tablet Commonly known as: TYLENOL  Take 1-2 tablets (325-650 mg total) by mouth every 4 (four) hours as needed for mild pain.   aspirin 81 MG tablet Take 1 tablet (81 mg total) by mouth daily. Start taking on: March 19, 2022 What changed: These instructions start on March 19, 2022. If you are unsure what to do until then, ask your doctor or other care provider.   atorvastatin 20 MG tablet Commonly known as:  LIPITOR Take 20 mg by mouth daily.   hydrochlorothiazide 12.5 MG tablet Commonly known as: HYDRODIURIL Take 12.5 mg by mouth daily.   losartan 100 MG tablet Commonly known as: COZAAR Take 100 mg by mouth every evening.   metoprolol tartrate 50 MG tablet Commonly known as: LOPRESSOR Take 1 tablet (50 mg total) by mouth 2 (two) times daily.   PreserVision AREDS 2 Caps Take 1 capsule by mouth in the morning and at bedtime. Lunch and late afternoon   VISINE DRY EYE OP Apply to eye as needed.        Disposition:    Follow-up Information     [facility] Follow up.   Why: on 6/22 at 320 for post pacemaker check Contact information: [place] [address] [phone]                Duration of Discharge Encounter: Greater than 30 minutes including physician time.  Jacalyn Lefevre, PA-C  03/16/2022 8:00 AM

## 2022-03-16 NOTE — TOC Transition Note (Signed)
Transition of Care [facility]) - CM/SW Discharge Note   Patient Details  Name: Susan [name] MRN: [id number] Date of Birth: 18-Jun-1932  [facility] CM/SW Contact:  Zenon Mayo, RN Phone Number: 03/16/2022, 8:17 AM   Clinical Narrative:    Patient is for dc today, s/p PPM. She has no needs.         Patient Goals and CMS Choice        Discharge Placement                       Discharge Plan and Services                                     Social Determinants of Health (SDOH) Interventions     Readmission Risk Interventions     No data to display

## 2022-03-25 ENCOUNTER — Ambulatory Visit (INDEPENDENT_AMBULATORY_CARE_PROVIDER_SITE_OTHER): Payer: Medicare Other

## 2022-03-25 DIAGNOSIS — I442 Atrioventricular block, complete: Secondary | ICD-10-CM | POA: Diagnosis not present

## 2022-03-25 MED ORDER — METOPROLOL TARTRATE 50 MG PO TABS: 50.0000 mg | ORAL_TABLET | Freq: Three times a day (TID) | ORAL | 3 refills | Status: DC

## 2022-03-25 NOTE — Patient Instructions (Addendum)
   After Your Pacemaker   Monitor your pacemaker site for redness, swelling, and drainage. Call the device clinic at [phone] if you experience these symptoms or fever/chills.  Your incision was closed with Steri-strips or staples:  You may shower   and wash your incision with soap and water. Avoid lotions, ointments, or perfumes over your incision until it is well-healed.  You may use a hot tub or a pool after your wound check appointment if the incision is completely closed.  Do not lift, push or pull greater than 10 pounds with the affected arm until 6 weeks after your procedure. There are no other restrictions in arm movement after your wound check appointment.   You may drive, unless driving has been restricted by your healthcare providers.  Your Pacemaker is MRI compatible.  Remote monitoring is used to monitor your pacemaker from home. This monitoring is scheduled every 91 days by our office. It allows [place] to keep an eye on the functioning of your device to ensure it is working properly. You will routinely see your Electrophysiologist annually (more often if necessary).

## 2022-03-27 LAB — CUP PACEART INCLINIC DEVICE CHECK
Battery Remaining Longevity: 124 mo
Battery Voltage: 3.1 V
Brady Statistic RA Percent Paced: 3.4 %
Brady Statistic RV Percent Paced: 71 %
Date Time Interrogation Session: 20230622152600
Implantable Lead Implant Date: 20230612
Implantable Lead Implant Date: 20230612
Implantable Lead Location: 753859
Implantable Lead Location: 753860
Implantable Pulse Generator Implant Date: 20230612
Lead Channel Impedance Value: 525 Ohm
Lead Channel Impedance Value: 550 Ohm
Lead Channel Pacing Threshold Amplitude: 0.5 V
Lead Channel Pacing Threshold Amplitude: 0.75 V
Lead Channel Pacing Threshold Pulse Width: 0.5 ms
Lead Channel Pacing Threshold Pulse Width: 0.5 ms
Lead Channel Sensing Intrinsic Amplitude: 3.2 mV
Lead Channel Sensing Intrinsic Amplitude: 3.2 mV
Lead Channel Setting Pacing Amplitude: 1 V
Lead Channel Setting Pacing Amplitude: 3.5 V
Lead Channel Setting Pacing Pulse Width: 0.5 ms
Lead Channel Setting Sensing Sensitivity: 0.5 mV
Pulse Gen Model: 2272
Pulse Gen Serial Number: 8087591

## 2022-04-09 ENCOUNTER — Telehealth: Payer: Self-pay

## 2022-04-09 NOTE — Telephone Encounter (Signed)
The patient called to confirm her appointment on 04/26/2022 but it was not an appointment. Its the day she can start back lifting 10 lbs or more.

## 2022-06-15 ENCOUNTER — Ambulatory Visit (INDEPENDENT_AMBULATORY_CARE_PROVIDER_SITE_OTHER): Payer: Medicare Other

## 2022-06-15 DIAGNOSIS — I442 Atrioventricular block, complete: Secondary | ICD-10-CM | POA: Diagnosis not present

## 2022-06-15 LAB — CUP PACEART REMOTE DEVICE CHECK
Battery Remaining Longevity: 101 mo
Battery Remaining Percentage: 95.5 %
Battery Voltage: 3.01 V
Brady Statistic AP VP Percent: 21 %
Brady Statistic AP VS Percent: 1 %
Brady Statistic AS VP Percent: 69 %
Brady Statistic AS VS Percent: 3.4 %
Brady Statistic RA Percent Paced: 13 %
Brady Statistic RV Percent Paced: 89 %
Date Time Interrogation Session: 20230912020012
Implantable Lead Implant Date: 20230612
Implantable Lead Implant Date: 20230612
Implantable Lead Location: 753859
Implantable Lead Location: 753860
Implantable Pulse Generator Implant Date: 20230612
Lead Channel Impedance Value: 510 Ohm
Lead Channel Impedance Value: 580 Ohm
Lead Channel Pacing Threshold Amplitude: 0.5 V
Lead Channel Pacing Threshold Amplitude: 0.875 V
Lead Channel Pacing Threshold Pulse Width: 0.5 ms
Lead Channel Pacing Threshold Pulse Width: 0.5 ms
Lead Channel Sensing Intrinsic Amplitude: 2.3 mV
Lead Channel Sensing Intrinsic Amplitude: 5.6 mV
Lead Channel Setting Pacing Amplitude: 1.125
Lead Channel Setting Pacing Amplitude: 3.5 V
Lead Channel Setting Pacing Pulse Width: 0.5 ms
Lead Channel Setting Sensing Sensitivity: 0.5 mV
Pulse Gen Model: 2272
Pulse Gen Serial Number: 8087591

## 2022-06-16 ENCOUNTER — Encounter: Payer: Self-pay | Admitting: *Deleted

## 2022-06-21 NOTE — Progress Notes (Unsigned)
Electrophysiology Office Follow up Visit Note:    Date:  06/23/2022   ID:  Susan [name], DOB [birth year], MRN [id number]  PCP:  Albina Billet, MD  [facility] Cardiologist:  Kathlyn Sacramento, MD  [facility] Electrophysiologist:  Vickie Epley, MD    Interval History:    Susan [name] is a 88 y.o. female who presents for a follow up visit. She had a PPM implanted 03/15/2022 for CHB. Remote interrogation since implant has shown stable device function.  Today she tells me that she feels fatigued by the middle of the day each day.  She has trouble doing household work.  She is currently taking metoprolol 3 times daily.  She checks her blood pressures at home and they are in the 120s to low 140s with the average blood pressure being less than 140 mmHg.    Past Medical History:  Diagnosis Date   Colon polyp    Diffuse cystic mastopathy    Hyperlipidemia    Hypertension    Macular degeneration    Tachycardia     Past Surgical History:  Procedure Laterality Date   ABDOMINAL HYSTERECTOMY     APPENDECTOMY     BREAST BIOPSY Bilateral 2006   benign, clip markers in both breasts   COLONOSCOPY  05/03/2015   EYE SURGERY Bilateral 2014   cataract   PACEMAKER IMPLANT N/A 03/15/2022   Procedure: PACEMAKER IMPLANT;  Surgeon: Vickie Epley, MD;  Location: [place] [facility];  Service: Cardiovascular;  Laterality: N/A;   salpingo oophorectmy      Current Medications: Current Meds  Medication Sig   acetaminophen (TYLENOL) 325 MG tablet Take 1-2 tablets (325-650 mg total) by mouth every 4 (four) hours as needed for mild pain.   aspirin 81 MG tablet Take 1 tablet (81 mg total) by mouth daily.   atorvastatin (LIPITOR) 20 MG tablet Take 20 mg by mouth daily.   Glycerin-Hypromellose-PEG 400 (VISINE DRY EYE OP) Apply to eye as needed.   hydrochlorothiazide (HYDRODIURIL) 12.5 MG tablet Take 12.5 mg by mouth daily.   losartan (COZAAR) 100 MG tablet Take 100 mg by mouth every  evening.   metFORMIN (GLUCOPHAGE) 500 MG tablet Take 500 mg by mouth daily with breakfast. Take 2 tablets by mouth in the morning and one tablet in the evening   metoprolol tartrate (LOPRESSOR) 50 MG tablet Take 1 tablet (50 mg total) by mouth in the morning, at noon, and at bedtime.   Multiple Vitamins-Minerals (PRESERVISION AREDS 2) CAPS Take 1 capsule by mouth in the morning and at bedtime. Lunch and late afternoon     Allergies:   Patient has no known allergies.   Social History   Socioeconomic History   Marital status: Divorced    Spouse name: Not on file   Number of children: Not on file   Years of education: Not on file   Highest education level: Not on file  Occupational History   Not on file  Tobacco Use   Smoking status: Never   Smokeless tobacco: Never  Vaping Use   Vaping Use: Never used  Substance and Sexual Activity   Alcohol use: No   Drug use: No   Sexual activity: Not on file  Other Topics Concern   Not on file  Social History Narrative   Not on file   Social Determinants of Health   Financial Resource Strain: Not on file  Food Insecurity: Not on file  Transportation Needs: Not on file  Physical Activity: Not on file  Stress: Not on file  Social Connections: Not on file     Family History: The patient's family history includes Kidney cancer in her maternal aunt; Lung cancer in her father. There is no history of Breast cancer.  ROS:   Please see the history of present illness.    All other systems reviewed and are negative.  EKGs/Labs/Other Studies Reviewed:    The following studies were reviewed today:  06/23/2022 in clinic device interrogation personally reviewed Histogram shows most rates are in the 60 to 80 bpm range.  It is rather flat.  Lead parameters stable.  Battery longevity okay.     Recent Labs: 03/11/2022: Hemoglobin 12.2; Magnesium 1.9; Platelets 210; TSH 2.500 03/14/2022: B Natriuretic Peptide 47.0 03/16/2022: BUN 14; Creatinine,  Ser 0.71; Potassium 3.8; Sodium 139  Recent Lipid Panel    Component Value Date/Time   CHOL 163 03/11/2014 0522   TRIG 333 (H) 03/11/2014 0522   HDL 44 03/11/2014 0522   VLDL 67 (H) 03/11/2014 0522   LDLCALC 52 03/11/2014 0522    Physical Exam:    VS:  BP (!) 142/80   Pulse 76   Ht '5\' 6"'$  (1.676 m)   Wt 170 lb (77.1 kg)   BMI 27.44 kg/m     Wt Readings from Last 3 Encounters:  06/23/22 170 lb (77.1 kg)  03/16/22 166 lb 11.2 oz (75.6 kg)  03/14/22 173 lb 4.5 oz (78.6 kg)     GEN:  Well nourished, well developed in no acute distress HEENT: Normal NECK: No JVD; No carotid bruits LYMPHATICS: No lymphadenopathy CARDIAC: RRR, no murmurs, rubs, gallops.  Pacemaker pocket well-healed RESPIRATORY:  Clear to auscultation without rales, wheezing or rhonchi  ABDOMEN: Soft, non-tender, non-distended MUSCULOSKELETAL:  No edema; No deformity  SKIN: Warm and dry NEUROLOGIC:  Alert and oriented x 3 PSYCHIATRIC:  Normal affect        ASSESSMENT:    1. Heart block AV complete (HCC)   2. Cardiac pacemaker in situ   3. Chronic diastolic CHF (congestive heart failure) (HCC)    PLAN:    In order of problems listed above:  #CHB #PPM in situ Device functioning appropriately. Continue remote monitoring. Histogram shows most rates are on the low side.  I will decrease her metoprolol to 50 mg of metoprolol succinate once daily at night.  She will stop the metoprolol tartrate.  #Chronic diastolic HF NYHA Class 1. Warm and dry today on exam.  #Hypertension Slightly above goal today but she checks her blood pressures at least 1 time per day and reports these are well controlled.  Recommend checking blood pressures 1-2 times per week at home and recording the values.  Recommend bringing these recordings to the primary care physician.  Follow up 1 year or sooner as needed. APP appointment.    Medication Adjustments/Labs and Tests Ordered: Current medicines are reviewed at length  with the patient today.  Concerns regarding medicines are outlined above.  No orders of the defined types were placed in this encounter.  No orders of the defined types were placed in this encounter.    Signed, Lars Mage, MD, [facility], [facility] 06/23/2022 2:15 PM    Electrophysiology [facility]

## 2022-06-23 ENCOUNTER — Ambulatory Visit: Payer: Medicare Other | Attending: Cardiology | Admitting: Cardiology

## 2022-06-23 ENCOUNTER — Encounter: Payer: Self-pay | Admitting: Cardiology

## 2022-06-23 VITALS — BP 142/80 | HR 76 | Ht 66.0 in | Wt 170.0 lb

## 2022-06-23 DIAGNOSIS — I5032 Chronic diastolic (congestive) heart failure: Secondary | ICD-10-CM

## 2022-06-23 DIAGNOSIS — Z950 Presence of cardiac pacemaker: Secondary | ICD-10-CM

## 2022-06-23 DIAGNOSIS — I10 Essential (primary) hypertension: Secondary | ICD-10-CM | POA: Diagnosis not present

## 2022-06-23 DIAGNOSIS — I442 Atrioventricular block, complete: Secondary | ICD-10-CM

## 2022-06-23 MED ORDER — METOPROLOL SUCCINATE ER 50 MG PO TB24: 50.0000 mg | ORAL_TABLET | Freq: Every day | ORAL | 3 refills | Status: DC

## 2022-06-23 NOTE — Patient Instructions (Addendum)
Medication Instructions:  Stop Metoprolol tartrate   Start Metoprolol succinate 50 mg daily at bedtime *If you need a refill on your cardiac medications before your next appointment, please call your pharmacy*   Lab Work: none If you have labs (blood work) drawn today and your tests are completely normal, you will receive your results only by: [place] (if you have MyChart) OR A paper copy in the mail If you have any lab test that is abnormal or we need to change your treatment, we will call you to review the results.   Testing/Procedures: none   Follow-Up: At [facility], you and your health needs are our priority.  As part of our continuing mission to provide you with exceptional heart care, we have created designated Provider Care Teams.  These Care Teams include your primary Cardiologist (physician) and Advanced Practice Providers (APPs -  Physician Assistants and Nurse Practitioners) who all work together to provide you with the care you need, when you need it.  We recommend signing up for the patient portal called "MyChart".  Sign up information is provided on this After Visit Summary.  MyChart is used to connect with patients for Virtual Visits (Telemedicine).  Patients are able to view lab/test results, encounter notes, upcoming appointments, etc.  Non-urgent messages can be sent to your provider as well.   To learn more about what you can do with MyChart, go to [email or web address].    Your next appointment:   1 year(s)  The format for your next appointment:   In Person  Provider:   You will see one of the following Advanced Practice Providers on your designated Care Team:   Murray Hodgkins, NP Christell Faith, PA-C Cadence Kathlen Mody, PA-C Gerrie Nordmann, NP      Other Instructions None   Important Information About [place]

## 2022-06-29 ENCOUNTER — Telehealth: Payer: Self-pay

## 2022-06-29 NOTE — Telephone Encounter (Signed)
Pt called [facility] Triage 06/29/22 at 215 pm.   Pt stated after seeing Dr. Quentin Ore on 06/23/22, she began taking Toprol XL 50 mg.  Pt states medication caused her HR and BP to go up?  Pt states Thursday and Friday her BP was 220/170, and stated she could feel her HR was beating too fast.      Pt decided to stop taking Toprol XL 50 Mg, and she STARTED TAKING her Metoprolol Tartrate 50 Mg instead.    Pt checked her BP and HR on the telephone with [place] and it was: 130 / 70 and HR 70.    Pt requested Toprol XL 50 Mg to be removed from her medication list.  Follow up required, forwarded to Dr. Quentin Ore and RN.

## 2022-07-01 NOTE — Progress Notes (Signed)
Remote pacemaker transmission.   

## 2022-07-05 MED ORDER — METOPROLOL TARTRATE 50 MG PO TABS: 50.0000 mg | ORAL_TABLET | Freq: Three times a day (TID) | ORAL | 3 refills | Status: DC

## 2022-07-05 NOTE — Telephone Encounter (Signed)
   Susan Epley, MD Yes.  CL    [name]:         Okay to make medication changes?     Medications adjusted back to old dosing and medication. Metoprolol tartrate 50 mg TID. Discontinued Metoprolol succinate.

## 2022-09-13 ENCOUNTER — Other Ambulatory Visit: Payer: Self-pay | Admitting: Ophthalmology

## 2022-09-13 DIAGNOSIS — H4911 Fourth [trochlear] nerve palsy, right eye: Secondary | ICD-10-CM

## 2022-09-14 ENCOUNTER — Ambulatory Visit (INDEPENDENT_AMBULATORY_CARE_PROVIDER_SITE_OTHER): Payer: Medicare Other

## 2022-09-14 DIAGNOSIS — I442 Atrioventricular block, complete: Secondary | ICD-10-CM

## 2022-09-14 LAB — CUP PACEART REMOTE DEVICE CHECK
Battery Remaining Longevity: 120 mo
Battery Remaining Percentage: 95.5 %
Battery Voltage: 3.01 V
Brady Statistic AP VP Percent: 18 %
Brady Statistic AP VS Percent: 1 %
Brady Statistic AS VP Percent: 76 %
Brady Statistic AS VS Percent: 1.7 %
Brady Statistic RA Percent Paced: 14 %
Brady Statistic RV Percent Paced: 94 %
Date Time Interrogation Session: 20231212055332
Implantable Lead Connection Status: 753985
Implantable Lead Connection Status: 753985
Implantable Lead Implant Date: 20230612
Implantable Lead Implant Date: 20230612
Implantable Lead Location: 753859
Implantable Lead Location: 753860
Implantable Pulse Generator Implant Date: 20230612
Lead Channel Impedance Value: 510 Ohm
Lead Channel Impedance Value: 530 Ohm
Lead Channel Pacing Threshold Amplitude: 0.75 V
Lead Channel Pacing Threshold Amplitude: 0.875 V
Lead Channel Pacing Threshold Pulse Width: 0.5 ms
Lead Channel Pacing Threshold Pulse Width: 0.5 ms
Lead Channel Sensing Intrinsic Amplitude: 3.6 mV
Lead Channel Sensing Intrinsic Amplitude: 7.6 mV
Lead Channel Setting Pacing Amplitude: 1.125
Lead Channel Setting Pacing Amplitude: 2 V
Lead Channel Setting Pacing Pulse Width: 0.5 ms
Lead Channel Setting Sensing Sensitivity: 0.5 mV
Pulse Gen Model: 2272
Pulse Gen Serial Number: 8087591

## 2022-09-17 ENCOUNTER — Telehealth: Payer: Self-pay | Admitting: Cardiovascular Disease

## 2022-09-17 NOTE — Telephone Encounter (Signed)
Pt c/o medication issue:  1. Name of Medication:    2. How are you currently taking this medication (dosage and times per day)?    3. Are you having a reaction (difficulty breathing--STAT)? no  4. What is your medication issue? Calling to see if she needs to be taking aspirin and a blood thinner. Please advise

## 2022-09-17 NOTE — Telephone Encounter (Signed)
Returned the call to the patient. 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 Her PCP, Dr. Hall Busing started her on Plavix 75 mg once daily in addition to her 81 mg aspirin. CT of the head is scheduled for 12/19.   She was calling to let Dr. Fletcher Anon and Dr. Quentin Ore know the update and advise on the addition of the Plavix.

## 2022-09-21 ENCOUNTER — Ambulatory Visit
Admission: RE | Admit: 2022-09-21 | Discharge: 2022-09-21 | Disposition: A | Discharge status: Home / Self Care | Payer: Medicare Other | Source: Ambulatory Visit | Attending: Ophthalmology | Admitting: Ophthalmology

## 2022-09-21 DIAGNOSIS — H4911 Fourth [trochlear] nerve palsy, right eye: Secondary | ICD-10-CM | POA: Insufficient documentation

## 2022-09-21 LAB — POCT I-STAT CREATININE: Creatinine, Ser: 0.8 mg/dL (ref 0.44–1.00)

## 2022-09-21 MED ORDER — IOHEXOL 300 MG/ML  SOLN
80.0000 mL | Freq: Once | INTRAMUSCULAR | Status: AC | PRN
  Administered 2022-09-21: 80 mL via INTRAVENOUS

## 2022-10-13 NOTE — Progress Notes (Signed)
Remote pacemaker transmission.   

## 2022-12-14 ENCOUNTER — Ambulatory Visit (INDEPENDENT_AMBULATORY_CARE_PROVIDER_SITE_OTHER): Payer: Medicare Other

## 2022-12-14 DIAGNOSIS — I442 Atrioventricular block, complete: Secondary | ICD-10-CM | POA: Diagnosis not present

## 2022-12-15 ENCOUNTER — Ambulatory Visit
Admission: RE | Admit: 2022-12-15 | Discharge: 2022-12-15 | Disposition: A | Discharge status: Home / Self Care | Payer: Medicare Other | Source: Ambulatory Visit | Attending: Urology | Admitting: Urology

## 2022-12-15 DIAGNOSIS — N2889 Other specified disorders of kidney and ureter: Secondary | ICD-10-CM | POA: Diagnosis present

## 2022-12-15 LAB — POCT I-STAT CREATININE: Creatinine, Ser: 0.7 mg/dL (ref 0.44–1.00)

## 2022-12-15 MED ORDER — IOHEXOL 300 MG/ML  SOLN
80.0000 mL | Freq: Once | INTRAMUSCULAR | Status: AC | PRN
  Administered 2022-12-15: 80 mL via INTRAVENOUS

## 2022-12-16 ENCOUNTER — Telehealth: Payer: Self-pay

## 2022-12-16 LAB — CUP PACEART REMOTE DEVICE CHECK
Battery Remaining Longevity: 118 mo
Battery Remaining Percentage: 95.5 %
Battery Voltage: 3.01 V
Brady Statistic AP VP Percent: 17 %
Brady Statistic AP VS Percent: 1 %
Brady Statistic AS VP Percent: 79 %
Brady Statistic AS VS Percent: 1.1 %
Brady Statistic RA Percent Paced: 14 %
Brady Statistic RV Percent Paced: 96 %
Date Time Interrogation Session: 20240312020012
Implantable Lead Connection Status: 753985
Implantable Lead Connection Status: 753985
Implantable Lead Implant Date: 20230612
Implantable Lead Implant Date: 20230612
Implantable Lead Location: 753859
Implantable Lead Location: 753860
Implantable Pulse Generator Implant Date: 20230612
Lead Channel Impedance Value: 540 Ohm
Lead Channel Impedance Value: 550 Ohm
Lead Channel Pacing Threshold Amplitude: 0.75 V
Lead Channel Pacing Threshold Amplitude: 0.875 V
Lead Channel Pacing Threshold Pulse Width: 0.5 ms
Lead Channel Pacing Threshold Pulse Width: 0.5 ms
Lead Channel Sensing Intrinsic Amplitude: 2.1 mV
Lead Channel Sensing Intrinsic Amplitude: 5.7 mV
Lead Channel Setting Pacing Amplitude: 1.125
Lead Channel Setting Pacing Amplitude: 2 V
Lead Channel Setting Pacing Pulse Width: 0.5 ms
Lead Channel Setting Sensing Sensitivity: 0.5 mV
Pulse Gen Model: 2272
Pulse Gen Serial Number: 8087591

## 2022-12-16 NOTE — Telephone Encounter (Signed)
error 

## 2022-12-20 ENCOUNTER — Telehealth: Payer: Self-pay

## 2022-12-20 NOTE — Telephone Encounter (Signed)
-----   Message from Vickie Epley, MD sent at 12/18/2022  8:38 PM EDT ----- Regarding: RE: afib on scheduled remote? True AF on the monitor. Given her history of ophthalmic stroke, I would like to start her on an anticoagulant. Can we get the patient scheduled for an AF clinic appointment to discuss anticoagulation. She will need CBC. Thanks! Lars Mage    ----- Message ----- From: Damian Leavell, RN Sent: 12/16/2022  12:51 PM EDT To: Vickie Epley, MD Subject: afib on scheduled remote?                      When you get a chance, will you review this transmission and see if you think it is afib?  It's really wonky with the AMS strips.  I think the only one I may agree with is the longest episode.  The other ones look like maybe she is having runs of PAC's?  Let me know what you think.  Send to device if she needs to start an [place].  Sonia Baller

## 2022-12-20 NOTE — Telephone Encounter (Signed)
Pt refused appt to afib clinic states she prefers to be followed in the [place] location due to transportation. Will forward to scheduling.

## 2022-12-20 NOTE — Telephone Encounter (Signed)
Called patient to advise Dr. Quentin Ore recommends AF clinic referral to discuss [facility] therapy. Patient voiced understanding.

## 2022-12-22 ENCOUNTER — Emergency Department: Payer: Medicare Other

## 2022-12-22 ENCOUNTER — Encounter: Payer: Self-pay | Admitting: Emergency Medicine

## 2022-12-22 ENCOUNTER — Inpatient Hospital Stay
Admission: EM | Admit: 2022-12-22 | Discharge: 2022-12-24 | DRG: 287 | Disposition: A | Discharge status: Home / Self Care | Payer: Medicare Other | Attending: Internal Medicine | Admitting: Internal Medicine

## 2022-12-22 DIAGNOSIS — Z8673 Personal history of transient ischemic attack (TIA), and cerebral infarction without residual deficits: Secondary | ICD-10-CM

## 2022-12-22 DIAGNOSIS — E119 Type 2 diabetes mellitus without complications: Secondary | ICD-10-CM | POA: Diagnosis present

## 2022-12-22 DIAGNOSIS — Z86010 Personal history of colonic polyps: Secondary | ICD-10-CM

## 2022-12-22 DIAGNOSIS — I160 Hypertensive urgency: Secondary | ICD-10-CM | POA: Insufficient documentation

## 2022-12-22 DIAGNOSIS — E1169 Type 2 diabetes mellitus with other specified complication: Secondary | ICD-10-CM

## 2022-12-22 DIAGNOSIS — I5181 Takotsubo syndrome: Principal | ICD-10-CM | POA: Diagnosis present

## 2022-12-22 DIAGNOSIS — E876 Hypokalemia: Secondary | ICD-10-CM | POA: Diagnosis not present

## 2022-12-22 DIAGNOSIS — Z79899 Other long term (current) drug therapy: Secondary | ICD-10-CM

## 2022-12-22 DIAGNOSIS — R079 Chest pain, unspecified: Secondary | ICD-10-CM | POA: Insufficient documentation

## 2022-12-22 DIAGNOSIS — Z950 Presence of cardiac pacemaker: Secondary | ICD-10-CM

## 2022-12-22 DIAGNOSIS — I480 Paroxysmal atrial fibrillation: Secondary | ICD-10-CM | POA: Diagnosis present

## 2022-12-22 DIAGNOSIS — Z801 Family history of malignant neoplasm of trachea, bronchus and lung: Secondary | ICD-10-CM

## 2022-12-22 DIAGNOSIS — I2510 Atherosclerotic heart disease of native coronary artery without angina pectoris: Secondary | ICD-10-CM | POA: Diagnosis present

## 2022-12-22 DIAGNOSIS — Z7984 Long term (current) use of oral hypoglycemic drugs: Secondary | ICD-10-CM

## 2022-12-22 DIAGNOSIS — Z8051 Family history of malignant neoplasm of kidney: Secondary | ICD-10-CM

## 2022-12-22 DIAGNOSIS — Z9989 Dependence on other enabling machines and devices: Secondary | ICD-10-CM

## 2022-12-22 DIAGNOSIS — Z90710 Acquired absence of both cervix and uterus: Secondary | ICD-10-CM

## 2022-12-22 DIAGNOSIS — N2889 Other specified disorders of kidney and ureter: Secondary | ICD-10-CM | POA: Diagnosis present

## 2022-12-22 DIAGNOSIS — I2489 Other forms of acute ischemic heart disease: Secondary | ICD-10-CM

## 2022-12-22 DIAGNOSIS — I4891 Unspecified atrial fibrillation: Secondary | ICD-10-CM

## 2022-12-22 DIAGNOSIS — R519 Headache, unspecified: Secondary | ICD-10-CM | POA: Insufficient documentation

## 2022-12-22 DIAGNOSIS — I10 Essential (primary) hypertension: Secondary | ICD-10-CM | POA: Diagnosis present

## 2022-12-22 DIAGNOSIS — E785 Hyperlipidemia, unspecified: Secondary | ICD-10-CM | POA: Diagnosis present

## 2022-12-22 DIAGNOSIS — I214 Non-ST elevation (NSTEMI) myocardial infarction: Secondary | ICD-10-CM

## 2022-12-22 DIAGNOSIS — G4733 Obstructive sleep apnea (adult) (pediatric): Secondary | ICD-10-CM | POA: Diagnosis present

## 2022-12-22 DIAGNOSIS — Z7982 Long term (current) use of aspirin: Secondary | ICD-10-CM

## 2022-12-22 DIAGNOSIS — I499 Cardiac arrhythmia, unspecified: Secondary | ICD-10-CM

## 2022-12-22 DIAGNOSIS — Z8669 Personal history of other diseases of the nervous system and sense organs: Secondary | ICD-10-CM

## 2022-12-22 DIAGNOSIS — I441 Atrioventricular block, second degree: Secondary | ICD-10-CM | POA: Diagnosis present

## 2022-12-22 DIAGNOSIS — I444 Left anterior fascicular block: Secondary | ICD-10-CM | POA: Diagnosis present

## 2022-12-22 DIAGNOSIS — R7989 Other specified abnormal findings of blood chemistry: Secondary | ICD-10-CM

## 2022-12-22 HISTORY — DX: Type 2 diabetes mellitus without complications: E11.9

## 2022-12-22 HISTORY — DX: Paroxysmal atrial fibrillation: I48.0

## 2022-12-22 HISTORY — DX: Other ill-defined heart diseases: I51.89

## 2022-12-22 HISTORY — DX: Atrioventricular block, second degree: I44.1

## 2022-12-22 HISTORY — DX: Cerebral infarction, unspecified: I63.9

## 2022-12-22 LAB — CBC WITH DIFFERENTIAL/PLATELET
Abs Immature Granulocytes: 0.02 10*3/uL (ref 0.00–0.07)
Basophils Absolute: 0.1 10*3/uL (ref 0.0–0.1)
Basophils Relative: 1 %
Eosinophils Absolute: 0.1 10*3/uL (ref 0.0–0.5)
Eosinophils Relative: 2 %
HCT: 38.6 % (ref 36.0–46.0)
Hemoglobin: 12.7 g/dL (ref 12.0–15.0)
Immature Granulocytes: 0 %
Lymphocytes Relative: 35 %
Lymphs Abs: 3.1 10*3/uL (ref 0.7–4.0)
MCH: 28.8 pg (ref 26.0–34.0)
MCHC: 32.9 g/dL (ref 30.0–36.0)
MCV: 87.5 fL (ref 80.0–100.0)
Monocytes Absolute: 0.7 10*3/uL (ref 0.1–1.0)
Monocytes Relative: 8 %
Neutro Abs: 4.8 10*3/uL (ref 1.7–7.7)
Neutrophils Relative %: 54 %
Platelets: 230 10*3/uL (ref 150–400)
RBC: 4.41 MIL/uL (ref 3.87–5.11)
RDW: 12.7 % (ref 11.5–15.5)
WBC: 8.8 10*3/uL (ref 4.0–10.5)
nRBC: 0 % (ref 0.0–0.2)

## 2022-12-22 LAB — COMPREHENSIVE METABOLIC PANEL
ALT: 17 U/L (ref 0–44)
AST: 19 U/L (ref 15–41)
Albumin: 4.2 g/dL (ref 3.5–5.0)
Alkaline Phosphatase: 72 U/L (ref 38–126)
Anion gap: 10 (ref 5–15)
BUN: 14 mg/dL (ref 8–23)
CO2: 20 mmol/L — ABNORMAL LOW (ref 22–32)
Calcium: 9.1 mg/dL (ref 8.9–10.3)
Chloride: 104 mmol/L (ref 98–111)
Creatinine, Ser: 0.6 mg/dL (ref 0.44–1.00)
GFR, Estimated: >60 mL/min (ref >60)
Glucose, Bld: 133 mg/dL — ABNORMAL HIGH (ref 70–99)
Potassium: 3.6 mmol/L (ref 3.5–5.1)
Sodium: 134 mmol/L — ABNORMAL LOW (ref 135–145)
Total Bilirubin: 0.9 mg/dL (ref 0.3–1.2)
Total Protein: 6.9 g/dL (ref 6.5–8.1)

## 2022-12-22 LAB — TROPONIN I (HIGH SENSITIVITY): Troponin I (High Sensitivity): 60 ng/L — ABNORMAL HIGH (ref <18)

## 2022-12-22 MED ORDER — ASPIRIN 81 MG PO CHEW
324.0000 mg | CHEWABLE_TABLET | Freq: Once | ORAL | Status: AC
  Administered 2022-12-23: 324 mg via ORAL
  Filled 2022-12-22: qty 4

## 2022-12-22 MED ORDER — NITROGLYCERIN 2 % TD OINT
1.0000 [in_us] | TOPICAL_OINTMENT | Freq: Once | TRANSDERMAL | Status: AC
  Administered 2022-12-23: 1 [in_us] via TOPICAL
  Filled 2022-12-22: qty 1

## 2022-12-22 NOTE — ED Provider Notes (Incomplete)
[facility] Provider Note    Event Date/Time   First MD Initiated Contact with Patient 12/22/22 2259     (approximate)   History   Hypertension   HPI  Susan [name] is a 89 y.o. female who presents to the ED from home with a chief complaint of elevated blood pressure.  Patient reports issues with blood pressure over the past 40 years.  Currently on metoprolol.  Experienced headache like pressure in her head, took her blood pressure which was greater than 200/110s.  Took an extra half dose of her metoprolol prior to arrival.  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  She was called by her cardiology office yesterday that they noted she was having periods of fast heart rate and scheduled her an appointment for next week to possibly place her on a blood thinner.  Endorses chest tightness.  Denies recent fever/chills, cough, shortness of breath, abdominal pain, nausea or vomiting.     Past Medical History   Past Medical History:  Diagnosis Date  . Colon polyp   . Diffuse cystic mastopathy   . Hyperlipidemia   . Hypertension   . Macular degeneration   . Tachycardia      Active Problem List   Patient Active Problem List   Diagnosis Date Noted  . Heart block AV complete ([place]) 03/15/2022  . Chronic diastolic CHF (congestive heart failure) ([place]) 03/14/2022  . Overweight (BMI 25.0-29.9) 03/14/2022  . Acute urinary retention 03/11/2022  . DM type 2 with diabetic mixed hyperlipidemia ([place]) 03/11/2022  . Bradycardia 03/10/2022  . Heart block, AV   . Skin abrasion 03/06/2018  . Asymptomatic varicose veins of both lower extremities 03/06/2018  . Bilateral lower extremity edema 03/06/2018  . PSVT (paroxysmal supraventricular tachycardia) 11/07/2014  . Tachycardia 03/15/2014  . Hypertension   . Nipple discharge 03/22/2013  . Fibrocystic breast 03/22/2013  . Personal history of colonic polyps 03/22/2013     Past Surgical History   Past Surgical  History:  Procedure Laterality Date  . ABDOMINAL HYSTERECTOMY    . APPENDECTOMY    . BREAST BIOPSY Bilateral 2006   benign, clip markers in both breasts  . COLONOSCOPY  05/03/2015  . EYE SURGERY Bilateral 2014   cataract  . PACEMAKER IMPLANT N/A 03/15/2022   Procedure: PACEMAKER IMPLANT;  Surgeon: Vickie Epley, MD;  Location: [facility];  Service: Cardiovascular;  Laterality: N/A;  . salpingo oophorectmy       Home Medications   Prior to Admission medications   Medication Sig Start Date End Date Taking? Authorizing Provider  acetaminophen (TYLENOL) 325 MG tablet Take 1-2 tablets (325-650 mg total) by mouth every 4 (four) hours as needed for mild pain. 03/16/22   Shirley Friar, PA-C  aspirin 81 MG tablet Take 1 tablet (81 mg total) by mouth daily. 03/19/22   Shirley Friar, PA-C  atorvastatin (LIPITOR) 20 MG tablet Take 20 mg by mouth daily.    [provider]  Glycerin-Hypromellose-PEG 400 (VISINE DRY EYE OP) Apply to eye as needed.    [provider]  hydrochlorothiazide (HYDRODIURIL) 12.5 MG tablet Take 12.5 mg by mouth daily. 05/04/17   [provider]  losartan (COZAAR) 100 MG tablet Take 100 mg by mouth every evening. 05/04/17   [provider]  metFORMIN (GLUCOPHAGE) 500 MG tablet Take 500 mg by mouth daily with breakfast. Take 2 tablets by mouth in the morning and one tablet in the evening 05/30/22  [provider]  metoprolol tartrate (LOPRESSOR) 50 MG tablet Take 1 tablet (50 mg total) by mouth in the morning, at noon, and at bedtime. 07/05/22   Vickie Epley, MD  Multiple Vitamins-Minerals (PRESERVISION AREDS 2) CAPS Take 1 capsule by mouth in the morning and at bedtime. Lunch and late afternoon    [provider]     Allergies  Patient has no known allergies.   Family History   Family History  Problem Relation Age of Onset  . Lung cancer Father   . Kidney cancer Maternal Aunt   .  Breast cancer Neg Hx      Physical Exam  Triage Vital Signs: ED Triage Vitals  Enc Vitals Group     BP 12/22/22 2138 (!) 165/90     Pulse Rate 12/22/22 2138 65     Resp 12/22/22 2138 18     Temp 12/22/22 2138 98.1 F (36.7 C)     Temp Source 12/22/22 2138 Oral     SpO2 12/22/22 2138 100 %     Weight 12/22/22 2137 168 lb (76.2 kg)     Height 12/22/22 2137 5\' 6"  (1.676 m)     Head Circumference --      Peak Flow --      Pain Score 12/22/22 2137 3     Pain Loc --      Pain Edu? --      Excl. in [place]? --     Updated Vital Signs: BP (!) 165/90 (BP Location: Left Arm)   Pulse 65   Temp 98.1 F (36.7 C) (Oral)   Resp 18   Ht 5\' 6"  (1.676 m)   Wt 76.2 kg   SpO2 100%   BMI 27.12 kg/m    General: Awake, no distress.  CV:  RRR.  Good peripheral perfusion.  Resp:  Normal effort.  CTAB. Abd:  Nontender.  No distention.  Other:  Bilateral calves are supple without tenderness.   ED Results / Procedures / Treatments  Labs (all labs ordered are listed, but only abnormal results are displayed) Labs Reviewed  CBC WITH DIFFERENTIAL/PLATELET  CBC WITH DIFFERENTIAL/PLATELET  COMPREHENSIVE METABOLIC PANEL     EKG  ED ECG REPORT I, Anias Bartol J, the attending physician, personally viewed and interpreted this ECG.   Date: 12/23/2022  EKG Time: ***  Rate: ***  Rhythm: {ekg findings:315101}  Axis: ***  Intervals:{conduction defects:17367}  ST&T Change: ***    RADIOLOGY *** {You MUST document your own interpretation of imaging, as well as the fact that you reviewed the radiologist's report!:1}  Official radiology report(s): No results found.   PROCEDURES:  Critical Care performed: {CriticalCareYesNo:19197::"Yes, see critical care procedure note(s)","No"}  Procedures   MEDICATIONS ORDERED IN ED: Medications - No data to display   IMPRESSION / MDM / [place] / ED COURSE  I reviewed the triage vital signs and the nursing notes.                               Differential diagnosis includes, but is not limited to, ***  Patient's presentation is most consistent with {EM COPA:27473}  {If the patient is on the monitor, remove the brackets and asterisks on the sentence below and remember to document it as a Procedure as well. Otherwise delete the sentence below:1} {**The patient is on the cardiac monitor to evaluate for evidence of arrhythmia and/or significant heart rate changes.**}  {Remember  to include, when applicable, any/all of the following data: independent review of imaging independent review of labs (comment specifically on pertinent positives and negatives) review of specific prior hospitalizations, PCP/specialist notes, etc. discuss meds given and prescribed document any discussion with consultants (including hospitalists) any clinical decision tools you used and why (PECARN, NEXUS, etc.) did you consider admitting the patient? document social determinants of health affecting patient's care (homelessness, inability to follow up in a timely fashion, etc) document any pre-existing conditions increasing risk on current visit (e.g. diabetes and HTN increasing danger of high-risk chest pain/ACS) describes what meds you gave (especially parenteral) and why any other interventions?:1}      FINAL CLINICAL IMPRESSION(S) / ED DIAGNOSES   Final diagnoses:  Hypertension, unspecified type     Rx / DC Orders   ED Discharge Orders     None        Note:  This document was prepared using Dragon voice recognition software and may include unintentional dictation errors.

## 2022-12-22 NOTE — ED Provider Notes (Signed)
[facility] Provider Note    Event Date/Time   First MD Initiated Contact with Patient 12/22/22 2259     (approximate)   History   Hypertension   HPI  Susan [name] is a 89 y.o. female who presents to the ED from home with a chief complaint of elevated blood pressure.  Patient reports issues with blood pressure over the past 40 years.  Currently on metoprolol.  Experienced headache like pressure in her head, took her blood pressure which was greater than 200/110s.  Took an extra half dose of her metoprolol prior to arrival.  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  She was called by her cardiology office yesterday that they noted she was having periods of fast heart rate and scheduled her an appointment for next week to possibly place her on a blood thinner.  Endorses chest tightness.  Denies recent fever/chills, cough, shortness of breath, abdominal pain, nausea or vomiting.     Past Medical History   Past Medical History:  Diagnosis Date   Colon polyp    Diffuse cystic mastopathy    Hyperlipidemia    Hypertension    Macular degeneration    Tachycardia      Active Problem List   Patient Active Problem List   Diagnosis Date Noted   Heart block AV complete ([place]) 03/15/2022   Chronic diastolic CHF (congestive heart failure) ([place]) 03/14/2022   Overweight (BMI 25.0-29.9) 03/14/2022   Acute urinary retention 03/11/2022   DM type 2 with diabetic mixed hyperlipidemia ([place]) 03/11/2022   Bradycardia 03/10/2022   Heart block, AV    Skin abrasion 03/06/2018   Asymptomatic varicose veins of both lower extremities 03/06/2018   Bilateral lower extremity edema 03/06/2018   PSVT (paroxysmal supraventricular tachycardia) 11/07/2014   Tachycardia 03/15/2014   Hypertension    Nipple discharge 03/22/2013   Fibrocystic breast 03/22/2013   Personal history of colonic polyps 03/22/2013     Past Surgical History   Past Surgical History:  Procedure  Laterality Date   ABDOMINAL HYSTERECTOMY     APPENDECTOMY     BREAST BIOPSY Bilateral 2006   benign, clip markers in both breasts   COLONOSCOPY  05/03/2015   EYE SURGERY Bilateral 2014   cataract   PACEMAKER IMPLANT N/A 03/15/2022   Procedure: PACEMAKER IMPLANT;  Surgeon: Vickie Epley, MD;  Location: [facility];  Service: Cardiovascular;  Laterality: N/A;   salpingo oophorectmy       Home Medications   Prior to Admission medications   Medication Sig Start Date End Date Taking? Authorizing Provider  acetaminophen (TYLENOL) 325 MG tablet Take 1-2 tablets (325-650 mg total) by mouth every 4 (four) hours as needed for mild pain. 03/16/22   Shirley Friar, PA-C  aspirin 81 MG tablet Take 1 tablet (81 mg total) by mouth daily. 03/19/22   Shirley Friar, PA-C  atorvastatin (LIPITOR) 20 MG tablet Take 20 mg by mouth daily.    [provider]  Glycerin-Hypromellose-PEG 400 (VISINE DRY EYE OP) Apply to eye as needed.    [provider]  hydrochlorothiazide (HYDRODIURIL) 12.5 MG tablet Take 12.5 mg by mouth daily. 05/04/17   [provider]  losartan (COZAAR) 100 MG tablet Take 100 mg by mouth every evening. 05/04/17   [provider]  metFORMIN (GLUCOPHAGE) 500 MG tablet Take 500 mg by mouth daily with breakfast. Take 2 tablets by mouth in the morning and one tablet in the evening 05/30/22  [provider]  metoprolol tartrate (LOPRESSOR) 50 MG tablet Take 1 tablet (50 mg total) by mouth in the morning, at noon, and at bedtime. 07/05/22   Vickie Epley, MD  Multiple Vitamins-Minerals (PRESERVISION AREDS 2) CAPS Take 1 capsule by mouth in the morning and at bedtime. Lunch and late afternoon    [provider]     Allergies  Patient has no known allergies.   Family History   Family History  Problem Relation Age of Onset   Lung cancer Father    Kidney cancer Maternal Aunt    Breast cancer Neg Hx       Physical Exam  Triage Vital Signs: ED Triage Vitals  Enc Vitals Group     BP 12/22/22 2138 (!) 165/90     Pulse Rate 12/22/22 2138 65     Resp 12/22/22 2138 18     Temp 12/22/22 2138 98.1 F (36.7 C)     Temp Source 12/22/22 2138 Oral     SpO2 12/22/22 2138 100 %     Weight 12/22/22 2137 168 lb (76.2 kg)     Height 12/22/22 2137 5\' 6"  (1.676 m)     Head Circumference --      Peak Flow --      Pain Score 12/22/22 2137 3     Pain Loc --      Pain Edu? --      Excl. in [place]? --     Updated Vital Signs: BP (!) 150/100   Pulse 69   Temp 98.1 F (36.7 C) (Oral)   Resp 19   Ht 5\' 6"  (1.676 m)   Wt 76.2 kg   SpO2 100%   BMI 27.12 kg/m    General: Awake, no distress.  CV:  RRR.  Good peripheral perfusion.  Resp:  Normal effort.  CTAB. Abd:  Nontender.  No distention.  Other:  Bilateral calves are supple without tenderness.   ED Results / Procedures / Treatments  Labs (all labs ordered are listed, but only abnormal results are displayed) Labs Reviewed  COMPREHENSIVE METABOLIC PANEL - Abnormal; Notable for the following components:      Result Value   Sodium 134 (*)    CO2 20 (*)    Glucose, Bld 133 (*)    All other components within normal limits  TROPONIN I (HIGH SENSITIVITY) - Abnormal; Notable for the following components:   Troponin I (High Sensitivity) 60 (*)    All other components within normal limits  CBC WITH DIFFERENTIAL/PLATELET  CBC WITH DIFFERENTIAL/PLATELET  URINALYSIS, ROUTINE W REFLEX MICROSCOPIC     EKG  ED ECG REPORT I, Kynadie Yaun J, the attending physician, personally viewed and interpreted this ECG.   Date: 12/23/2022  EKG Time: 0012  Rate: 67  Rhythm: atrial fibrillation, rate 67  Axis: Normal  Intervals: IVCD  ST&T Change: Nonspecific    RADIOLOGY I have independently visualized and interpreted patient's CT and chest x-ray as well as noted the radiology interpretation:  CT head: No ICH  Chest x-ray: No acute  cardiopulmonary process  Official radiology report(s): CT Head Wo Contrast  Result Date: 12/22/2022 CLINICAL DATA:  Headache. EXAM: CT HEAD WITHOUT CONTRAST TECHNIQUE: Contiguous axial images were obtained from the base of the skull through the vertex without intravenous contrast. RADIATION DOSE REDUCTION: This exam was performed according to the departmental dose-optimization program which includes automated exposure control, adjustment of the mA and/or kV according to patient size and/or use of iterative reconstruction  technique. COMPARISON:  None Available. FINDINGS: Brain: There is mild cerebral atrophy with widening of the extra-axial spaces and ventricular dilatation. There are areas of decreased attenuation within the white matter tracts of the supratentorial brain, consistent with microvascular disease changes. Vascular: No hyperdense vessel or unexpected calcification. Skull: Normal. Negative for fracture or focal lesion. Sinuses/Orbits: No acute finding. Other: None. IMPRESSION: 1. No acute intracranial abnormality. 2. Generalized cerebral atrophy and microvascular disease changes of the supratentorial brain. Electronically Signed   By: Virgina Norfolk M.D.   On: 12/22/2022 23:45   DG Chest Port 1 View  Result Date: 12/22/2022 CLINICAL DATA:  Hypertension EXAM: PORTABLE CHEST 1 VIEW COMPARISON:  03/16/2022 FINDINGS: Left pacer remains in place, unchanged. Heart and mediastinal contours are within normal limits. No focal opacities or effusions. No acute bony abnormality. Aortic atherosclerosis. IMPRESSION: No active cardiopulmonary disease. Electronically Signed   By: Rolm Baptise M.D.   On: 12/22/2022 23:20     PROCEDURES:  Critical Care performed: Yes CRITICAL CARE Performed by: Paulette Blanch   Total critical care time: 30 minutes  Critical care time was exclusive of separately billable procedures and treating other patients.  Critical care was necessary to treat or prevent imminent  or life-threatening deterioration.  Critical care was time spent personally by me on the following activities: development of treatment plan with patient and/or surrogate as well as nursing, discussions with consultants, evaluation of patient's response to treatment, examination of patient, obtaining history from patient or surrogate, ordering and performing treatments and interventions, ordering and review of laboratory studies, ordering and review of radiographic studies, pulse oximetry and re-evaluation of patient's condition.   [place] Kitchen1-3 Lead EKG Interpretation  Performed by: Paulette Blanch, MD Authorized by: Paulette Blanch, MD     Interpretation: abnormal     ECG rate:  65   ECG rate assessment: normal     Rhythm: atrial fibrillation     Ectopy: none     Conduction: normal   Comments:     Patient placed on cardiac monitor to evaluate for arrhythmias    MEDICATIONS ORDERED IN ED: Medications  aspirin chewable tablet 324 mg (324 mg Oral Given 12/23/22 0003)  nitroGLYCERIN (NITROGLYN) 2 % ointment 1 inch (1 inch Topical Given 12/23/22 0004)     IMPRESSION / MDM / ASSESSMENT AND PLAN / ED COURSE  I reviewed the triage vital signs and the nursing notes.                             89 year old female presenting with elevated blood pressure, headache and chest pain. Differential diagnosis includes, but is not limited to, ACS, aortic dissection, pulmonary embolism, cardiac tamponade, pneumothorax, pneumonia, pericarditis, myocarditis, GI-related causes including esophagitis/gastritis, and musculoskeletal chest wall pain.   I personally reviewed patient's records and note her cardiology telephone communication on 12/20/2022.  Patient's presentation is most consistent with acute presentation with potential threat to life or bodily function.  The patient is on the cardiac monitor to evaluate for evidence of arrhythmia and/or significant heart rate changes.  Laboratory results demonstrate normal  WBC 8.8, normal electrolytes, initial troponin is 60.  Chest x-ray unremarkable.  Blood pressure currently 148/75; patient did take extra metoprolol prior to arrival.  Will administer baby aspirin and nitroglycerin paste.  Given that patient was exhibiting new onset atrial fibrillation, coupled with elevated blood pressure and chest pain, will consult hospital services for evaluation and admission.  FINAL CLINICAL IMPRESSION(S) / ED DIAGNOSES   Final diagnoses:  Hypertension, unspecified type  New onset atrial fibrillation (HCC)  Elevated troponin     Rx / DC Orders   ED Discharge Orders     None        Note:  This document was prepared using Dragon voice recognition software and may include unintentional dictation errors.   Paulette Blanch, MD 12/23/22 [id number]

## 2022-12-22 NOTE — ED Triage Notes (Signed)
Pt presents ambulatory to triage via POV with complaints of HTN with an associated pressure in her head due to the elevated BP. Per EMS the patient had a BP of 200s/110s and she took 1/2 extra dose of her BP medication (total of 175mg  of metoprolol). A&Ox4 at this time. Denies CP or SOB.

## 2022-12-22 NOTE — ED Triage Notes (Signed)
EMS brings pt in from home for reports HTN; took extra ds of metoprolol; denies any accomp symptoms

## 2022-12-23 ENCOUNTER — Encounter: Payer: Self-pay | Admitting: Internal Medicine

## 2022-12-23 ENCOUNTER — Encounter
Admission: EM | Disposition: A | Discharge status: Home / Self Care | Payer: Self-pay | Source: Home / Self Care | Attending: Internal Medicine

## 2022-12-23 ENCOUNTER — Other Ambulatory Visit: Payer: Medicare Other

## 2022-12-23 ENCOUNTER — Other Ambulatory Visit: Payer: Self-pay

## 2022-12-23 DIAGNOSIS — I160 Hypertensive urgency: Secondary | ICD-10-CM | POA: Diagnosis present

## 2022-12-23 DIAGNOSIS — E785 Hyperlipidemia, unspecified: Secondary | ICD-10-CM | POA: Diagnosis present

## 2022-12-23 DIAGNOSIS — I10 Essential (primary) hypertension: Secondary | ICD-10-CM | POA: Diagnosis present

## 2022-12-23 DIAGNOSIS — I4891 Unspecified atrial fibrillation: Secondary | ICD-10-CM

## 2022-12-23 DIAGNOSIS — Z79899 Other long term (current) drug therapy: Secondary | ICD-10-CM | POA: Diagnosis not present

## 2022-12-23 DIAGNOSIS — I444 Left anterior fascicular block: Secondary | ICD-10-CM | POA: Diagnosis present

## 2022-12-23 DIAGNOSIS — Z8673 Personal history of transient ischemic attack (TIA), and cerebral infarction without residual deficits: Secondary | ICD-10-CM | POA: Diagnosis not present

## 2022-12-23 DIAGNOSIS — Z9989 Dependence on other enabling machines and devices: Secondary | ICD-10-CM | POA: Diagnosis not present

## 2022-12-23 DIAGNOSIS — I2489 Other forms of acute ischemic heart disease: Secondary | ICD-10-CM

## 2022-12-23 DIAGNOSIS — G4733 Obstructive sleep apnea (adult) (pediatric): Secondary | ICD-10-CM | POA: Diagnosis present

## 2022-12-23 DIAGNOSIS — I214 Non-ST elevation (NSTEMI) myocardial infarction: Secondary | ICD-10-CM | POA: Diagnosis not present

## 2022-12-23 DIAGNOSIS — R7989 Other specified abnormal findings of blood chemistry: Secondary | ICD-10-CM

## 2022-12-23 DIAGNOSIS — Z801 Family history of malignant neoplasm of trachea, bronchus and lung: Secondary | ICD-10-CM | POA: Diagnosis not present

## 2022-12-23 DIAGNOSIS — R519 Headache, unspecified: Secondary | ICD-10-CM | POA: Insufficient documentation

## 2022-12-23 DIAGNOSIS — I2510 Atherosclerotic heart disease of native coronary artery without angina pectoris: Secondary | ICD-10-CM

## 2022-12-23 DIAGNOSIS — Z8051 Family history of malignant neoplasm of kidney: Secondary | ICD-10-CM | POA: Diagnosis not present

## 2022-12-23 DIAGNOSIS — Z7984 Long term (current) use of oral hypoglycemic drugs: Secondary | ICD-10-CM | POA: Diagnosis not present

## 2022-12-23 DIAGNOSIS — Z7982 Long term (current) use of aspirin: Secondary | ICD-10-CM | POA: Diagnosis not present

## 2022-12-23 DIAGNOSIS — I499 Cardiac arrhythmia, unspecified: Secondary | ICD-10-CM

## 2022-12-23 DIAGNOSIS — I5181 Takotsubo syndrome: Secondary | ICD-10-CM | POA: Diagnosis present

## 2022-12-23 DIAGNOSIS — N2889 Other specified disorders of kidney and ureter: Secondary | ICD-10-CM | POA: Diagnosis present

## 2022-12-23 DIAGNOSIS — Z86010 Personal history of colonic polyps: Secondary | ICD-10-CM | POA: Diagnosis not present

## 2022-12-23 DIAGNOSIS — Z950 Presence of cardiac pacemaker: Secondary | ICD-10-CM | POA: Diagnosis not present

## 2022-12-23 DIAGNOSIS — E876 Hypokalemia: Secondary | ICD-10-CM | POA: Diagnosis not present

## 2022-12-23 DIAGNOSIS — R079 Chest pain, unspecified: Secondary | ICD-10-CM | POA: Insufficient documentation

## 2022-12-23 DIAGNOSIS — I480 Paroxysmal atrial fibrillation: Secondary | ICD-10-CM | POA: Diagnosis present

## 2022-12-23 DIAGNOSIS — E119 Type 2 diabetes mellitus without complications: Secondary | ICD-10-CM | POA: Diagnosis present

## 2022-12-23 DIAGNOSIS — Z90710 Acquired absence of both cervix and uterus: Secondary | ICD-10-CM | POA: Diagnosis not present

## 2022-12-23 HISTORY — PX: LEFT HEART CATH AND CORONARY ANGIOGRAPHY: CATH118249

## 2022-12-23 LAB — COMPREHENSIVE METABOLIC PANEL
ALT: 17 U/L (ref 0–44)
AST: 24 U/L (ref 15–41)
Albumin: 3.9 g/dL (ref 3.5–5.0)
Alkaline Phosphatase: 65 U/L (ref 38–126)
Anion gap: 10 (ref 5–15)
BUN: 11 mg/dL (ref 8–23)
CO2: 23 mmol/L (ref 22–32)
Calcium: 8.9 mg/dL (ref 8.9–10.3)
Chloride: 105 mmol/L (ref 98–111)
Creatinine, Ser: 0.55 mg/dL (ref 0.44–1.00)
GFR, Estimated: >60 mL/min (ref >60)
Glucose, Bld: 121 mg/dL — ABNORMAL HIGH (ref 70–99)
Potassium: 3.2 mmol/L — ABNORMAL LOW (ref 3.5–5.1)
Sodium: 138 mmol/L (ref 135–145)
Total Bilirubin: 0.8 mg/dL (ref 0.3–1.2)
Total Protein: 6.3 g/dL — ABNORMAL LOW (ref 6.5–8.1)

## 2022-12-23 LAB — LIPID PANEL
Cholesterol: 144 mg/dL (ref 0–200)
HDL: 46 mg/dL (ref >40)
LDL Cholesterol: 71 mg/dL (ref 0–99)
Total CHOL/HDL Ratio: 3.1 RATIO
Triglycerides: 133 mg/dL (ref <150)
VLDL: 27 mg/dL (ref 0–40)

## 2022-12-23 LAB — GLUCOSE, CAPILLARY
Glucose-Capillary: 129 mg/dL — ABNORMAL HIGH (ref 70–99)
Glucose-Capillary: 127 mg/dL — ABNORMAL HIGH (ref 70–99)
Glucose-Capillary: 182 mg/dL — ABNORMAL HIGH (ref 70–99)
Glucose-Capillary: 128 mg/dL — ABNORMAL HIGH (ref 70–99)

## 2022-12-23 LAB — URINALYSIS, ROUTINE W REFLEX MICROSCOPIC
Bilirubin Urine: NEGATIVE
Glucose, UA: NEGATIVE mg/dL
Hgb urine dipstick: NEGATIVE
Ketones, ur: NEGATIVE mg/dL
Leukocytes,Ua: NEGATIVE
Nitrite: NEGATIVE
Protein, ur: NEGATIVE mg/dL
Specific Gravity, Urine: 1.009 (ref 1.005–1.030)
pH: 6 (ref 5.0–8.0)

## 2022-12-23 LAB — HEMOGLOBIN A1C
Hgb A1c MFr Bld: 7 % — ABNORMAL HIGH (ref 4.8–5.6)
Mean Plasma Glucose: 154 mg/dL

## 2022-12-23 LAB — PROTIME-INR
INR: 1 (ref 0.8–1.2)
Prothrombin Time: 13.5 seconds (ref 11.4–15.2)

## 2022-12-23 LAB — CBC
HCT: 32.8 % — ABNORMAL LOW (ref 36.0–46.0)
Hemoglobin: 10.5 g/dL — ABNORMAL LOW (ref 12.0–15.0)
MCH: 28.6 pg (ref 26.0–34.0)
MCHC: 32 g/dL (ref 30.0–36.0)
MCV: 89.4 fL (ref 80.0–100.0)
Platelets: 187 10*3/uL (ref 150–400)
RBC: 3.67 MIL/uL — ABNORMAL LOW (ref 3.87–5.11)
RDW: 12.8 % (ref 11.5–15.5)
WBC: 6.3 10*3/uL (ref 4.0–10.5)
nRBC: 0 % (ref 0.0–0.2)

## 2022-12-23 LAB — APTT: aPTT: 33 seconds (ref 24–36)

## 2022-12-23 LAB — TROPONIN I (HIGH SENSITIVITY)
Troponin I (High Sensitivity): 582 ng/L (ref <18)
Troponin I (High Sensitivity): 383 ng/L (ref <18)

## 2022-12-23 LAB — TSH: TSH: 6.212 u[IU]/mL — ABNORMAL HIGH (ref 0.350–4.500)

## 2022-12-23 LAB — CREATININE, SERUM
Creatinine, Ser: 0.47 mg/dL (ref 0.44–1.00)
GFR, Estimated: >60 mL/min (ref >60)

## 2022-12-23 LAB — POTASSIUM: Potassium: 3.2 mmol/L — ABNORMAL LOW (ref 3.5–5.1)

## 2022-12-23 LAB — CBG MONITORING, ED: Glucose-Capillary: 115 mg/dL — ABNORMAL HIGH (ref 70–99)

## 2022-12-23 SURGERY — LEFT HEART CATH AND CORONARY ANGIOGRAPHY
Anesthesia: Moderate Sedation

## 2022-12-23 MED ORDER — ASPIRIN 81 MG PO CHEW
81.0000 mg | CHEWABLE_TABLET | Freq: Every day | ORAL | Status: DC
  Administered 2022-12-23: 81 mg via ORAL
  Filled 2022-12-23 (×2): qty 1

## 2022-12-23 MED ORDER — VERAPAMIL HCL 2.5 MG/ML IV SOLN
INTRAVENOUS | Status: DC | PRN
  Administered 2022-12-23: 2.5 mg via INTRA_ARTERIAL

## 2022-12-23 MED ORDER — SODIUM CHLORIDE 0.9% FLUSH: 3.0000 mL | INTRAVENOUS | Status: DC | PRN

## 2022-12-23 MED ORDER — HEPARIN (PORCINE) IN NACL 1000-0.9 UT/500ML-% IV SOLN
INTRAVENOUS | Status: DC | PRN
  Administered 2022-12-23 (×2): 500 mL

## 2022-12-23 MED ORDER — SODIUM CHLORIDE 0.9% FLUSH
3.0000 mL | Freq: Two times a day (BID) | INTRAVENOUS | Status: DC
  Administered 2022-12-23 – 2022-12-24 (×3): 3 mL via INTRAVENOUS

## 2022-12-23 MED ORDER — HEPARIN SODIUM (PORCINE) 5000 UNIT/ML IJ SOLN
5000.0000 [IU] | Freq: Three times a day (TID) | INTRAMUSCULAR | Status: DC
  Administered 2022-12-23 – 2022-12-24 (×2): 5000 [IU] via SUBCUTANEOUS
  Filled 2022-12-23: qty 1

## 2022-12-23 MED ORDER — ASPIRIN 81 MG PO CHEW: 81.0000 mg | CHEWABLE_TABLET | ORAL | Status: DC

## 2022-12-23 MED ORDER — IOHEXOL 300 MG/ML  SOLN
INTRAMUSCULAR | Status: DC | PRN
  Administered 2022-12-23: 59 mL

## 2022-12-23 MED ORDER — CLOPIDOGREL BISULFATE 75 MG PO TABS
75.0000 mg | ORAL_TABLET | Freq: Every day | ORAL | Status: DC
  Administered 2022-12-23 – 2022-12-24 (×2): 75 mg via ORAL
  Filled 2022-12-23 (×2): qty 1

## 2022-12-23 MED ORDER — NITROGLYCERIN 1 MG/10 ML FOR IR/CATH LAB
INTRA_ARTERIAL | Status: DC | PRN
  Administered 2022-12-23: 200 ug via INTRACORONARY

## 2022-12-23 MED ORDER — SODIUM CHLORIDE 0.9 % IV SOLN: 250.0000 mL | INTRAVENOUS | Status: DC | PRN

## 2022-12-23 MED ORDER — HEPARIN SODIUM (PORCINE) 1000 UNIT/ML IJ SOLN
INTRAMUSCULAR | Status: AC
  Filled 2022-12-23: qty 10

## 2022-12-23 MED ORDER — INSULIN ASPART 100 UNIT/ML IJ SOLN: 0.0000 [IU] | Freq: Every day | INTRAMUSCULAR | Status: DC

## 2022-12-23 MED ORDER — ATORVASTATIN CALCIUM 20 MG PO TABS
20.0000 mg | ORAL_TABLET | Freq: Every day | ORAL | Status: DC
  Administered 2022-12-23 – 2022-12-24 (×2): 20 mg via ORAL
  Filled 2022-12-23 (×2): qty 1

## 2022-12-23 MED ORDER — SODIUM CHLORIDE 0.9 % IV SOLN: INTRAVENOUS | Status: AC

## 2022-12-23 MED ORDER — SODIUM CHLORIDE 0.9% FLUSH
3.0000 mL | Freq: Two times a day (BID) | INTRAVENOUS | Status: DC
  Administered 2022-12-23: 3 mL via INTRAVENOUS

## 2022-12-23 MED ORDER — HEPARIN (PORCINE) IN NACL 1000-0.9 UT/500ML-% IV SOLN
INTRAVENOUS | Status: AC
  Filled 2022-12-23: qty 1000

## 2022-12-23 MED ORDER — METOPROLOL TARTRATE 50 MG PO TABS
50.0000 mg | ORAL_TABLET | Freq: Two times a day (BID) | ORAL | Status: DC
  Administered 2022-12-23: 50 mg via ORAL
  Filled 2022-12-23 (×2): qty 1

## 2022-12-23 MED ORDER — HEPARIN BOLUS VIA INFUSION
4000.0000 [IU] | Freq: Once | INTRAVENOUS | Status: AC
  Administered 2022-12-23: 4000 [IU] via INTRAVENOUS
  Filled 2022-12-23: qty 4000

## 2022-12-23 MED ORDER — HYDRALAZINE HCL 20 MG/ML IJ SOLN: 10.0000 mg | INTRAMUSCULAR | Status: DC | PRN

## 2022-12-23 MED ORDER — ACETAMINOPHEN 325 MG PO TABS: 650.0000 mg | ORAL_TABLET | Freq: Four times a day (QID) | ORAL | Status: DC | PRN

## 2022-12-23 MED ORDER — POTASSIUM CHLORIDE CRYS ER 20 MEQ PO TBCR
40.0000 meq | EXTENDED_RELEASE_TABLET | Freq: Once | ORAL | Status: AC
  Administered 2022-12-23: 40 meq via ORAL
  Filled 2022-12-23: qty 2

## 2022-12-23 MED ORDER — VERAPAMIL HCL 2.5 MG/ML IV SOLN
INTRAVENOUS | Status: AC
  Filled 2022-12-23: qty 2

## 2022-12-23 MED ORDER — FENTANYL CITRATE (PF) 100 MCG/2ML IJ SOLN
INTRAMUSCULAR | Status: DC | PRN
  Administered 2022-12-23: 25 ug via INTRAVENOUS

## 2022-12-23 MED ORDER — INSULIN ASPART 100 UNIT/ML IJ SOLN
0.0000 [IU] | Freq: Three times a day (TID) | INTRAMUSCULAR | Status: DC
  Administered 2022-12-23: 2 [IU] via SUBCUTANEOUS
  Filled 2022-12-23 (×2): qty 1

## 2022-12-23 MED ORDER — HEPARIN (PORCINE) 25000 UT/250ML-% IV SOLN
950.0000 [IU]/h | INTRAVENOUS | Status: DC
  Administered 2022-12-23: 950 [IU]/h via INTRAVENOUS
  Filled 2022-12-23: qty 250

## 2022-12-23 MED ORDER — FENTANYL CITRATE (PF) 100 MCG/2ML IJ SOLN
INTRAMUSCULAR | Status: AC
  Filled 2022-12-23: qty 2

## 2022-12-23 MED ORDER — SODIUM CHLORIDE 0.9 % IV SOLN: INTRAVENOUS | Status: DC

## 2022-12-23 MED ORDER — NITROGLYCERIN 1 MG/10 ML FOR IR/CATH LAB
INTRA_ARTERIAL | Status: AC
  Filled 2022-12-23: qty 10

## 2022-12-23 MED ORDER — MIDAZOLAM HCL 2 MG/2ML IJ SOLN
INTRAMUSCULAR | Status: AC
  Filled 2022-12-23: qty 2

## 2022-12-23 MED ORDER — HEPARIN SODIUM (PORCINE) 1000 UNIT/ML IJ SOLN
INTRAMUSCULAR | Status: DC | PRN
  Administered 2022-12-23: 4000 [IU] via INTRAVENOUS

## 2022-12-23 MED ORDER — ACETAMINOPHEN 650 MG RE SUPP: 650.0000 mg | Freq: Four times a day (QID) | RECTAL | Status: DC | PRN

## 2022-12-23 MED ORDER — LABETALOL HCL 5 MG/ML IV SOLN
20.0000 mg | INTRAVENOUS | Status: DC | PRN
  Administered 2022-12-23: 20 mg via INTRAVENOUS
  Filled 2022-12-23: qty 4

## 2022-12-23 SURGICAL SUPPLY — 11 items
CATH 5FR JL3.5 JR4 ANG PIG MP (CATHETERS) IMPLANT
DEVICE RAD TR BAND REGULAR (VASCULAR PRODUCTS) IMPLANT
DRAPE BRACHIAL (DRAPES) IMPLANT
GLIDESHEATH SLEND SS 6F .021 (SHEATH) IMPLANT
GUIDEWIRE INQWIRE 1.5J.035X260 (WIRE) IMPLANT
INQWIRE 1.5J .035X260CM (WIRE) ×1
PACK CARDIAC CATH (CUSTOM PROCEDURE TRAY) ×1 IMPLANT
PROTECTION STATION PRESSURIZED (MISCELLANEOUS) ×1
SET ATX-X65L (MISCELLANEOUS) IMPLANT
STATION PROTECTION PRESSURIZED (MISCELLANEOUS) IMPLANT
WIRE HITORQ VERSACORE ST 145CM (WIRE) IMPLANT

## 2022-12-23 NOTE — Assessment & Plan Note (Signed)
This is flat in terms of her previous troponin in the chart from 9 months ago.  We will check the troponin again in the morning make sure it is staying stable.

## 2022-12-23 NOTE — Assessment & Plan Note (Signed)
,   Was mild, resolved, related to hypertensive urgency.  At this time monitoring clinically and CT head is not showing any acute abnormality,

## 2022-12-23 NOTE — Consult Note (Addendum)
Cardiology Consult    Patient ID: NIKO AUMENT MRN: [id number], DOB/AGE: Dec 09, 1931   Admit date: 12/22/2022 Date of Consult: 12/23/2022  Primary Physician: Albina Billet, MD Primary Cardiologist: Kathlyn Sacramento, MD Requesting Provider: Chauncey Cruel. Posey Pronto, MD  Patient Profile    HENESIS MULLINS is a 89 y/o ? w/ a h/o HTN, HL, DMII, diast dysfxn, mobitz 2 s/p dual-chamber PPM, PSVT, OsA on CPAP, and L renal mass, who presented to the ED on the evening of 3/20 w/ headache, HTN urgency, and elevated HsTrop.  Past Medical History   Past Medical History:  Diagnosis Date   AV block, Mobitz II    a. 03/2022 s/p [employer] Assurity MRI PM 2272 (ser# [id number]) DC PPM w/ L bundle area lead.   Colon polyp    Diastolic dysfunction    a. 03/2022 Echo: EF 60-65%, no rwma, GrI DD, nl RV fxn, mild-mod MR, mild AS.   Diffuse cystic mastopathy    Hyperlipidemia    Hypertension    Macular degeneration    PAF (paroxysmal atrial fibrillation) ([place])    a. 12/3022 noted on device interrogation-->CHA2DS2VASc = 8.   Stroke (Ocular)    Tachycardia    Type 2 diabetes mellitus [facility])     Past Surgical History:  Procedure Laterality Date   ABDOMINAL HYSTERECTOMY     APPENDECTOMY     BREAST BIOPSY Bilateral 2006   benign, clip markers in both breasts   COLONOSCOPY  05/03/2015   EYE SURGERY Bilateral 2014   cataract   PACEMAKER IMPLANT N/A 03/15/2022   Procedure: PACEMAKER IMPLANT;  Surgeon: Vickie Epley, MD;  Location: [facility];  Service: Cardiovascular;  Laterality: N/A;   salpingo oophorectmy       Allergies  No Known Allergies  History of Present Illness    89 y/o ? w/ a h/o HTN, HL, DMII, diast dysfxn, mobitz 2 s/p dual-chamber PPM, PSVT, OSA on CPAP, and L renal mass.  In 03/2022, she presented to the eD w/ weakness, dizziness, and bradycardia w/ rates in the 40's w/ high grade AV block. She subsequently underwent DC PPM placement by Dr. Quentin Ore.  Echo at that time showed nl EF w/  mild-mod MR and mild AS.  She did well post-procedure but on 3/14, remote device interrogation showed PAF.  She was contacted w/ recommendation to be seen in Afib clinic in [place] for eliquis initiation, but she preferred to f/u in [place] and is currently scheduled for 3/27.  Ms. Monce was in her USOH until the evening of 12/22/2022, when she had sudden onset of severe headache, which she identified as being 2/2 elevated blood pressure. She checked her BP and it was markedly elevated, prompting her to take an extra metoprolol and contact EMS.  Per EMS, BP was 200's/110's.  She was taken to to the ED where BP was 165/90.  She was given NTP w/ improvement in BP to the 1-teens.  Labs relatively unremarkable however, HsTrop initially 60  582.  ECG - RSR w/ PACs and A/V pacing on demand.  Pt denies any h/o chest pain, though she admits to a several month history of exertional midscapular/upper back pain w/o associated symptoms.  This occurs daily, often w/ routine house chores or grocery shopping, lasts a few mins, and resolves w/ rest.  She is currently symptom free.  Inpatient Medications     [START ON 12/24/2022] aspirin  81 mg Oral Pre-Cath   insulin aspart  0-5 Units Subcutaneous  QHS   insulin aspart  0-9 Units Subcutaneous TID WC   potassium chloride  40 mEq Oral Once   sodium chloride flush  3 mL Intravenous Q12H   sodium chloride flush  3 mL Intravenous Q12H    Family History    Family History  Problem Relation Age of Onset   Aortic aneurysm Mother        died from ruptured Ao.   Lung cancer Father    Kidney cancer Maternal Aunt    Breast cancer Neg Hx    She indicated that her mother is deceased. She indicated that her father is deceased. She indicated that the status of her maternal aunt is unknown. She indicated that the status of her neg hx is unknown.   Social History    Social History   Socioeconomic History   Marital status: Divorced    Spouse name: Not on file    Number of children: Not on file   Years of education: Not on file   Highest education level: Not on file  Occupational History   Not on file  Tobacco Use   Smoking status: Never   Smokeless tobacco: Never  Vaping Use   Vaping Use: Never used  Substance and Sexual Activity   Alcohol use: No   Drug use: No   Sexual activity: Not on file  Other Topics Concern   Not on file  Social History Narrative   Not on file   Social Determinants of Health   Financial Resource Strain: Not on file  Food Insecurity: No Food Insecurity (12/23/2022)   Hunger Vital Sign    Worried About Running Out of Food in the Last Year: Never true    Ran Out of Food in the Last Year: Never true  Transportation Needs: No Transportation Needs (12/23/2022)   PRAPARE - Hydrologist (Medical): No    Lack of Transportation (Non-Medical): No  Physical Activity: Not on file  Stress: Not on file  Social Connections: Not on file  Intimate Partner Violence: Not At Risk (12/23/2022)   Humiliation, Afraid, Rape, and Kick questionnaire    Fear of Current or Ex-Partner: No    Emotionally Abused: No    Physically Abused: No    Sexually Abused: No     Review of Systems    General:  No chills, fever, night sweats or weight changes.  Cardiovascular:  No chest pain but has been having exertional upper back/mid-scapular pain, +++ dyspnea on exertion, no edema, orthopnea, palpitations, paroxysmal nocturnal dyspnea. Dermatological: No rash, lesions/masses Respiratory: No cough, +++ dyspnea Urologic: No hematuria, dysuria Abdominal:   No nausea, vomiting, diarrhea, bright red blood per rectum, melena, or hematemesis Neurologic:  +++ headache.  No visual changes, wkns, changes in mental status. All other systems reviewed and are otherwise negative except as noted above.  Physical Exam    Blood pressure 114/71, pulse 65, temperature 98.7 F (37.1 C), temperature source Oral, resp. rate (!) 21,  height 5\' 6"  (1.676 m), weight 76.2 kg, SpO2 93 %.  General: Pleasant, NAD Psych: Normal affect. Neuro: Alert and oriented X 3. Moves all extremities spontaneously. HEENT: Normal  Neck: Supple without bruits or JVD. Lungs:  Resp regular and unlabored, CTA. Heart: RRR no s3, s4, or murmurs. Abdomen: Soft, non-tender, non-distended, BS + x 4.  Extremities: No clubbing, cyanosis or edema. DP/PT2+, Radials 2+ and equal bilaterally.  Labs    Cardiac Enzymes Recent Labs  Lab 12/22/22 2249  12/23/22 0532  TROPONINIHS 60* 582*     BNP    Component Value Date/Time   BNP 47.0 03/14/2022 1402    Lab Results  Component Value Date   WBC 8.8 12/22/2022   HGB 12.7 12/22/2022   HCT 38.6 12/22/2022   MCV 87.5 12/22/2022   PLT 230 12/22/2022    Recent Labs  Lab 12/23/22 0658  NA 138  K 3.2*  CL 105  CO2 23  BUN 11  CREATININE 0.55  CALCIUM 8.9  PROT 6.3*  BILITOT 0.8  ALKPHOS 65  ALT 17  AST 24  GLUCOSE 121*   Lab Results  Component Value Date   CHOL 163 03/11/2014   HDL 44 03/11/2014   LDLCALC 52 03/11/2014   TRIG 333 (H) 03/11/2014     Radiology Studies    CT Head Wo Contrast  Result Date: 12/22/2022 CLINICAL DATA:  Headache. EXAM: CT HEAD WITHOUT CONTRAST TECHNIQUE: Contiguous axial images were obtained from the base of the skull through the vertex without intravenous contrast. RADIATION DOSE REDUCTION: This exam was performed according to the departmental dose-optimization program which includes automated exposure control, adjustment of the mA and/or kV according to patient size and/or use of iterative reconstruction technique. COMPARISON:  None Available. FINDINGS: Brain: There is mild cerebral atrophy with widening of the extra-axial spaces and ventricular dilatation. There are areas of decreased attenuation within the white matter tracts of the supratentorial brain, consistent with microvascular disease changes. Vascular: No hyperdense vessel or unexpected  calcification. Skull: Normal. Negative for fracture or focal lesion. Sinuses/Orbits: No acute finding. Other: None. IMPRESSION: 1. No acute intracranial abnormality. 2. Generalized cerebral atrophy and microvascular disease changes of the supratentorial brain. Electronically Signed   By: Virgina Norfolk M.D.   On: 12/22/2022 23:45   DG Chest Port 1 View  Result Date: 12/22/2022 CLINICAL DATA:  Hypertension EXAM: PORTABLE CHEST 1 VIEW COMPARISON:  03/16/2022 FINDINGS: Left pacer remains in place, unchanged. Heart and mediastinal contours are within normal limits. No focal opacities or effusions. No acute bony abnormality. Aortic atherosclerosis. IMPRESSION: No active cardiopulmonary disease. Electronically Signed   By: Rolm Baptise M.D.   On: 12/22/2022 23:20   CT Abdomen W Contrast  Result Date: 12/17/2022 CLINICAL DATA:  Follow-up left renal lesion EXAM: CT ABDOMEN WITH CONTRAST TECHNIQUE: Multidetector CT imaging of the abdomen was performed using the standard protocol following bolus administration of intravenous contrast. RADIATION DOSE REDUCTION: This exam was performed according to the departmental dose-optimization program which includes automated exposure control, adjustment of the mA and/or kV according to patient size and/or use of iterative reconstruction technique. CONTRAST:  4mL OMNIPAQUE IOHEXOL 300 MG/ML  SOLN COMPARISON:  CT abdomen dated 12/07/2021 FINDINGS: Lower chest: Partially imaged pacer leads terminate in the right atrium and ventricle. Scattered small 2 mm bibasilar nodules are again seen. No new or enlarging nodules within the lung bases. No pleural effusion or pneumothorax demonstrated. Partially imaged heart size is normal. Hepatobiliary: No focal hepatic lesions. No intra or extrahepatic biliary ductal dilation. Normal gallbladder. Pancreas: Increased conspicuity of 5 mm hypoattenuating focus within the pancreatic neck (2:62). No main ductal dilation, mass lesion, or abnormal  enhancement. Spleen: Normal in size without focal abnormality. Adrenals/Urinary Tract: No adrenal nodules. Unchanged 1.2 x 1.1 cm enhancing exophytic posterior left upper pole lesion (2:56). There is suggestion of a small focus of intrinsic fat attenuation (3:113). Unchanged left lower pole simple/minimally complicated cyst. 1.0 cm anterior right upper pole lesion (2:69) does not demonstrate  measurement consistent with a simple cyst. Subcentimeter right renal hypodensities are too small to characterize but likely cysts. Stomach/Bowel: Again seen is an enhancing nodular focus measuring 1.6 cm at the pylorus (2:66). Small duodenal diverticulum in the third portion. No evidence of bowel wall thickening, distention, or inflammatory changes. Colonic diverticulosis without acute diverticulitis. Vascular/Lymphatic: Aortic atherosclerosis. No enlarged abdominal lymph nodes. Other: No free fluid, fluid collection, or free air. Musculoskeletal: No acute or abnormal lytic or blastic osseous lesions. IMPRESSION: 1. Unchanged size of 1.2 cm enhancing exophytic posterior left upper pole renal lesion with suggestion of intrinsic fat attenuation, which raises the possibility of angiomyolipoma. 2. Indeterminate 1.0 cm anterior right upper pole renal lesion, unchanged from 05/27/2021. Attention on follow-up. 3. Increased conspicuity of 5 mm hypoattenuating focus within the pancreatic neck, which may represent side branch intraductal papillary mucinous neoplasms (IPMN). Recommend follow up pre and post contrast MRI/MRCP or pancreatic protocol CT in 2 years. 4. Enhancing nodular focus in the pylorus may represent a polyp or prominent mucosal fold. Attention on follow-up. Alternatively, endoscopy can be considered for further evaluation. 5.  Aortic Atherosclerosis (ICD10-I70.0). Electronically Signed   By: Darrin Nipper M.D.   On: 12/17/2022 13:39   CUP PACEART REMOTE DEVICE CHECK  Result Date: 12/16/2022 Scheduled remote reviewed.  Normal device function.  18 AMS, AF,  longest duration 2hrs 57min, burden <1%, no OAC.  Route to triage ? contraindicated Next remote 91 days. LA   ECG & Cardiac Imaging    RSR, 67, PACs, A & V pacing on demand - personally reviewed.  Assessment & Plan    1.  NSTEMI:  Pt w/ a several month h/o daily, exertional, mid-scapular/upper back pain, presented to the ED on 3/20 secondary to headache and marked HTN noted @ home (200/110 range).  Initial HsTrop 60  582.  No chest pain/back pain since arrival.  BP improved w/ NTP and IV labetalol.  Multiple RF for CAD including HTN, HL, DMII, Ao atherosclerosis noted on CT chest in 2022.  Will plan on cath today. The patient understands that risks include but are not limited to stroke (1 in 1000), death (1 in 70), kidney failure [usually temporary] (1 in 500), bleeding (1 in 200), allergic reaction [possibly serious] (1 in 200), and agrees to proceed.  Cont heparin.  Resume home doses of asa, statin, ? blocker, arb.  2.  HTN Urgency:  BP markedly elevated @ home.  Trending better here.  Resume home meds - titrate as necessary.  3.  HL:  Resume atorvastatin.  F/u lipids.  4.  PAF:  recently noted on device check.  Currently in sinus w/ PACs and A/V pacing on demand.  Not currently on [place] but CHA2DS2VASc = 8.  Will plan to add eliquis post-cath.  Cont ? blocker.  5.  DMII:  on metformin @ home.  SSI per IM.  6.  Hypokalemia:  supplementation ordered.  7.  L renal mass:  stable on recent CT.  8.  Abnl pancreatic neck finding:  on recent CT - 5 mm hypoattenuating focus within the pancreatic neck, which may represent side branch intraductal papillary mucinous neoplasms.  Rec for f/u in 2 yrs.  9.  High grade AV block:  s/p DC PPM last year.  Nl device fxn.  Risk Assessment/Risk Scores:     TIMI Risk Score for Unstable Angina or Non-ST Elevation MI:   The patient's TIMI risk score is 5, which indicates a 26% risk of all cause  mortality, new or recurrent  myocardial infarction or need for urgent revascularization in the next 14 days.    CHA2DS2-VASc Score = 8   This indicates a 10.8% annual risk of stroke. The patient's score is based upon: CHF History: 0 HTN History: 1 Diabetes History: 1 Stroke History: 2 Vascular Disease History: 1 Age Score: 2 Gender Score: 1     Signed, Murray Hodgkins, NP 12/23/2022, 10:08 AM   ATTENDING ATTESTATION  I have seen, examined and evaluated the patient this PM along with Mr. Sharolyn Douglas.  After reviewing all the available data and chart, we discussed the patients laboratory, study & physical findings as well as symptoms in detail.  I agree with his findings, examination as well as impression recommendations as per our discussion.    Attending adjustments noted in italics.   Patient was seen and evaluated by the attending cardiologist on rounds but will sign.  Presented with hypertensive urgency and elevated troponin concerning for possible non-STEMI.  Plan is for cardiac catheterization today.  Otherwise agree with plan noted above.    Leonie Man, MD, MS Glenetta Hew, M.D., M.S. Interventional Cardiologist  [facility]  Pager # [phone] Phone # [phone] [address] [place], [place] [address]    For questions or updates, please contact   Please consult [email or web address] for contact info under Cardiology/STEMI.

## 2022-12-23 NOTE — Progress Notes (Signed)
Dr. Ellyn Hack at bedside, speaking with pt. And her son Konrad Dolores re: cath results. Both verbalized understanding of conversation.

## 2022-12-23 NOTE — ED Notes (Signed)
Date and time results received: 12/23/22 0620 (use smartphrase ".now" to insert current time)  Test: troponin  Critical Value: 582  Name of Provider Notified: Dr. Otilio Miu  Orders Received? Or Actions Taken?: Awaiting orders

## 2022-12-23 NOTE — Assessment & Plan Note (Signed)
Follow-up cardiac review of EKG, at this time I do not think we caught an EKG with A-fib.

## 2022-12-23 NOTE — Progress Notes (Signed)
[place] [facility] [place] [facility] [place] NAME: Susan [name]    MR#:  [id number]  DATE OF BIRTH:  Aug 21, 1932  SUBJECTIVE:      VITALS:  Blood pressure 106/63, pulse 83, temperature 98 F (36.7 C), temperature source Oral, resp. rate 17, height 5\' 6"  (1.676 m), weight 76.2 kg, SpO2 96 %.  PHYSICAL EXAMINATION:   GENERAL:  89 y.o.-year-old patient with no acute distress.  LUNGS: Normal breath sounds bilaterally, no wheezing CARDIOVASCULAR: S1, S2 normal. No murmur   ABDOMEN: Soft, nontender, nondistended. Bowel sounds present.  EXTREMITIES: No  edema b/l.    NEUROLOGIC: nonfocal  patient is alert and awake SKIN: No obvious rash, lesion, or ulcer.   LABORATORY PANEL:  CBC Recent Labs  Lab 12/23/22 1504  WBC 6.3  HGB 10.5*  HCT 32.8*  PLT 187    Chemistries  Recent Labs  Lab 12/23/22 0658  NA 138  K 3.2*  CL 105  CO2 23  GLUCOSE 121*  BUN 11  CREATININE 0.55  CALCIUM 8.9  AST 24  ALT 17  ALKPHOS 65  BILITOT 0.8   Cardiac Enzymes No results for input(s): "TROPONINI" in the last 168 hours. RADIOLOGY:  CARDIAC CATHETERIZATION  Result Date: 12/23/2022   Mid LAD lesion is 45% stenosed with 40% stenosed side branch in 2nd Diag.  Otherwise normal coronary arteries   There is moderate left ventricular systolic dysfunction.   LV end diastolic pressure is mildly elevated.   The left ventricular ejection fraction is 35-45% by visual estimate. POST-CATH DIAGNOSES LV gram with moderate reduced EF of 40 to 45% with wall motion abnormality suggestive of Takotsubo cardiomyopathY Angiographically minimal CAD with a tapering moderate to small caliber LAD with a 50% stenosis at 2nd Diag and then tapers at that same caliber throughout the rest the vessel and does not reach the apex. Large dominant RCA with large PDA making up for the small LAD LCx system is made above Ramus Intermedius the course of the 1st Mrg an the Cx courses as a 2nd Mrg RECOMMENDATIONS  Okay to DC heparin and nitroglycerin With treat hypertension. Glenetta Hew, MD  CT Head Wo Contrast  Result Date: 12/22/2022 CLINICAL DATA:  Headache. EXAM: CT HEAD WITHOUT CONTRAST TECHNIQUE: Contiguous axial images were obtained from the base of the skull through the vertex without intravenous contrast. RADIATION DOSE REDUCTION: This exam was performed according to the departmental dose-optimization program which includes automated exposure control, adjustment of the mA and/or kV according to patient size and/or use of iterative reconstruction technique. COMPARISON:  None Available. FINDINGS: Brain: There is mild cerebral atrophy with widening of the extra-axial spaces and ventricular dilatation. There are areas of decreased attenuation within the white matter tracts of the supratentorial brain, consistent with microvascular disease changes. Vascular: No hyperdense vessel or unexpected calcification. Skull: Normal. Negative for fracture or focal lesion. Sinuses/Orbits: No acute finding. Other: None. IMPRESSION: 1. No acute intracranial abnormality. 2. Generalized cerebral atrophy and microvascular disease changes of the supratentorial brain. Electronically Signed   By: Virgina Norfolk M.D.   On: 12/22/2022 23:45   DG Chest Port 1 View  Result Date: 12/22/2022 CLINICAL DATA:  Hypertension EXAM: PORTABLE CHEST 1 VIEW COMPARISON:  03/16/2022 FINDINGS: Left pacer remains in place, unchanged. Heart and mediastinal contours are within normal limits. No focal opacities or effusions. No acute bony abnormality. Aortic atherosclerosis. IMPRESSION: No active cardiopulmonary disease. Electronically Signed   By: Rolm Baptise M.D.   On:  12/22/2022 23:20    Assessment and Plan Susan [name] is a 89 y.o. female with medical history significant of hypertension,.  Patient was in her usual state of health till earlier today at around 8 PM  when she reports a new onset of mild headache and anterior left-sided chest  discomfort.  Chest discomfort was on and off without any particular precipitating or relieving factors noted no radiation.  BP was elevated in ER  Hypertensive urgency --Symptomatic in terms of chest pressure as well as slight headache currently resolved.   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  >50% time spent on counselling and coordination of care  Note: This dictation was prepared with Dragon dictation along with smaller phrase technology. Any transcriptional errors that result from this process are unintentional.  Fritzi Mandes M.D    [facility]   CC: Primary care physician; Albina Billet, MD

## 2022-12-23 NOTE — Progress Notes (Signed)
Attending Note Patient seen and examined, agree with detailed note above,   Patient presentation and plan discussed on rounds.   Cardiology consult placed by Dr. Fritzi Mandes Reason for consult: Elevated troponin  EKG lab work, chest x-ray, echocardiogram reviewed independently by myself  Ms. Susan [name] is a 89 year old woman with history of hypertension, hyperlipidemia, diabetes type 2, pacemaker for Mobitz 2, obstructive sleep apnea on CPAP who presents with head pressure, hypertension, noted to have elevated troponins  She reports that she was in her usual state of health, last night developed acute onset head pressure.  In the past when she has similar symptoms, blood pressure runs high.  She checked her blood pressure, systolic pressure was close to 200.  She took extra half dose metoprolol succinate with mild improvement of her blood pressure and symptoms.  Later symptoms recurred, blood pressure again trending higher and she called EMTs Blood pressure with EMS 200s over 110s In the ER blood pressure 165/90, initial troponin 60, repeat 580 At no point did she have chest pain Reports having some mid scapular pain with certain activities at home such as with vacuuming, house chores Since hospitalization in the ER has had no significant chest pain  Currently on heparin infusion, Nitropaste  On examination : alert oriented, no JVD, lungs clear to auscultation bilaterally, heart sounds regular normal S1-S2 no murmurs appreciated, abdomen soft nontender no significant lower extremity edema.  Musculoskeletal exam with good range of motion, neurologic exam grossly nonfocal  Lab work reviewed Troponin 60, 582, 383 Potassium 3.2 sodium 138 creatinine 0.55 BUN 11 A1c and lipid panel pending WBC 8.8 hemoglobin 12.7  EKG personally reviewed by myself showing Atrial fibrillation with rate 67 bpm interventricular conduction delay, left anterior fascicular block unable to exclude old inferior  MI  A/P; Hypertensive urgency In the setting of head pressure Etiology unclear, reports baseline blood pressure typically [id number] systolic Certainly possible that head pressure/headache contributed to spike in blood pressure Current systolic pressure [id number] Would continue metoprolol to tartrate 50 3 times daily, losartan 100 daily Could give hydralazine 50 as needed for systolic pressure over 0000000  Elevated troponin/non-STEMI In the setting of malignant hypertension, systolic pressure of [id number] Plan for cardiac catheterization later today Risk factors include hyperlipidemia, and diabetes  no smoking history I have reviewed the risks, indications, and alternatives to cardiac catheterization, possible angioplasty, and stenting with the patient. Risks include but are not limited to bleeding, infection, vascular injury, stroke, myocardial infection, arrhythmia, kidney injury, radiation-related injury in the case of prolonged fluoroscopy use, emergency cardiac surgery, and death. The patient understands the risks of serious complication is 1-2 in [id number] with diagnostic cardiac cath and 1-2% or less with angioplasty/stenting.   Paroxysmal atrial fibrillation/pacemaker Noted on device check by EP Prior ophthalmic stroke, elevated CHADS VASC We will plan to initiate Eliquis following catheterization as above   Greater than 50% was spent in counseling and coordination of care with patient Total encounter time 80 minutes or more   Signed: Esmond Plants  M.D., Ph.D. [facility]

## 2022-12-23 NOTE — Progress Notes (Signed)
Labs drawn & sent via tube system to laboratory. Pt. Without any c/o CP, HA, SOB, dizziness, right wrist pain. Pt. In good spirits, speaking with son & neighbor.

## 2022-12-23 NOTE — Assessment & Plan Note (Signed)
Resolved.  Likely related to hypertensive urgency, troponin positive, trend

## 2022-12-23 NOTE — Progress Notes (Addendum)
       CROSS COVER NOTE  NAME: Susan [name] MRN: [id number] DOB : 08-18-1932 ATTENDING PHYSICIAN: Fritzi Mandes, MD    Date of Service   12/23/2022   HPI/Events of Note   Notified of elevated Troponin this AM --> 582.   Increased from 60 last night at 2249. Patient had nitro paste placed on chest in ED and is currently chest pain free. Susan [name] is an 89 yo F with PMH of HTN, HLD, paroxysmal SVT, AV block s/p pacemaker who presented to [facility] ED after developing mild headache and (L) chest tightness/discomfort, BP at home found to be SBP>200.   Interventions   Assessment/Plan:  NSTEMI Heparin per pharmacy Trend Troponin ECHO Lipid panel, TSH, A1C Consult Cardiology- Staff message sent to [facility] Dr Rockey Situ and NP Sharolyn Douglas       To reach the provider On-Call:   7AM- 7PM see care teams to locate the attending and reach out to them via [email or web address] Password: [email or web address] 7PM-7AM contact night-coverage If you still have difficulty reaching the appropriate provider, please page the [facility] (Director on Call) for [facility] on amion for assistance  This document was prepared using Systems analyst and may include unintentional dictation errors.  Neomia Glass DNP, MBA, FNP-BC, PMHNP-BC Nurse Practitioner [facility] Pager [phone]

## 2022-12-23 NOTE — H&P (View-Only) (Signed)
Cardiology Consult    Patient ID: Susan [name] MRN: [id number], DOB/AGE: 87/87/1933   Admit date: 12/22/2022 Date of Consult: 12/23/2022  Primary Physician: Albina Billet, MD Primary Cardiologist: Kathlyn Sacramento, MD Requesting Provider: Chauncey Cruel. Posey Pronto, MD  Patient Profile    Susan [name] is a 89 y/o ? w/ a h/o HTN, HL, DMII, diast dysfxn, mobitz 2 s/p dual-chamber PPM, PSVT, OsA on CPAP, and L renal mass, who presented to the ED on the evening of 3/20 w/ headache, HTN urgency, and elevated HsTrop.  Past Medical History   Past Medical History:  Diagnosis Date   AV block, Mobitz II    a. 03/2022 s/p Abbott Assurity MRI PM 2272 (ser# [id number]) DC PPM w/ L bundle area lead.   Colon polyp    Diastolic dysfunction    a. 03/2022 Echo: EF 60-65%, no rwma, GrI DD, nl RV fxn, mild-mod MR, mild AS.   Diffuse cystic mastopathy    Hyperlipidemia    Hypertension    Macular degeneration    PAF (paroxysmal atrial fibrillation) ([place])    a. 12/3022 noted on device interrogation-->CHA2DS2VASc = 8.   Stroke (Ocular)    Tachycardia    Type 2 diabetes mellitus [facility])     Past Surgical History:  Procedure Laterality Date   ABDOMINAL HYSTERECTOMY     APPENDECTOMY     BREAST BIOPSY Bilateral 2006   benign, clip markers in both breasts   COLONOSCOPY  05/03/2015   EYE SURGERY Bilateral 2014   cataract   PACEMAKER IMPLANT N/A 03/15/2022   Procedure: PACEMAKER IMPLANT;  Surgeon: Vickie Epley, MD;  Location: [facility];  Service: Cardiovascular;  Laterality: N/A;   salpingo oophorectmy       Allergies  No Known Allergies  History of Present Illness    89 y/o ? w/ a h/o HTN, HL, DMII, diast dysfxn, mobitz 2 s/p dual-chamber PPM, PSVT, OSA on CPAP, and L renal mass.  In 03/2022, she presented to the eD w/ weakness, dizziness, and bradycardia w/ rates in the 40's w/ high grade AV block. She subsequently underwent DC PPM placement by Susan [name].  Echo at that time showed nl EF w/  mild-mod MR and mild AS.  She did well post-procedure but on 3/14, remote device interrogation showed PAF.  She was contacted w/ recommendation to be seen in Afib clinic in [place] for eliquis initiation, but she preferred to f/u in [place] and is currently scheduled for 3/27.  Susan [name] was in her USOH until the evening of 12/22/2022, when she had sudden onset of severe headache, which she identified as being 2/2 elevated blood pressure. She checked her BP and it was markedly elevated, prompting her to take an extra metoprolol and contact EMS.  Per EMS, BP was 200's/110's.  She was taken to to the ED where BP was 165/90.  She was given NTP w/ improvement in BP to the 1-teens.  Labs relatively unremarkable however, HsTrop initially 60  582.  ECG - RSR w/ PACs and A/V pacing on demand.  Pt denies any h/o chest pain, though she admits to a several month history of exertional midscapular/upper back pain w/o associated symptoms.  This occurs daily, often w/ routine house chores or grocery shopping, lasts a few mins, and resolves w/ rest.  She is currently symptom free.  Inpatient Medications     [START ON 12/24/2022] aspirin  81 mg Oral Pre-Cath   insulin aspart  0-5 Units Subcutaneous  QHS   insulin aspart  0-9 Units Subcutaneous TID WC   potassium chloride  40 mEq Oral Once   sodium chloride flush  3 mL Intravenous Q12H   sodium chloride flush  3 mL Intravenous Q12H    Family History    Family History  Problem Relation Age of Onset   Aortic aneurysm Mother        died from ruptured Ao.   Lung cancer Father    Kidney cancer Maternal Aunt    Breast cancer Neg Hx    She indicated that her mother is deceased. She indicated that her father is deceased. She indicated that the status of her maternal aunt is unknown. She indicated that the status of her neg hx is unknown.   Social History    Social History   Socioeconomic History   Marital status: Divorced    Spouse name: Not on file    Number of children: Not on file   Years of education: Not on file   Highest education level: Not on file  Occupational History   Not on file  Tobacco Use   Smoking status: Never   Smokeless tobacco: Never  Vaping Use   Vaping Use: Never used  Substance and Sexual Activity   Alcohol use: No   Drug use: No   Sexual activity: Not on file  Other Topics Concern   Not on file  Social History Narrative   Not on file   Social Determinants of Health   Financial Resource Strain: Not on file  Food Insecurity: No Food Insecurity (12/23/2022)   Hunger Vital Sign    Worried About Running Out of Food in the Last Year: Never true    Ran Out of Food in the Last Year: Never true  Transportation Needs: No Transportation Needs (12/23/2022)   PRAPARE - Hydrologist (Medical): No    Lack of Transportation (Non-Medical): No  Physical Activity: Not on file  Stress: Not on file  Social Connections: Not on file  Intimate Partner Violence: Not At Risk (12/23/2022)   Humiliation, Afraid, Rape, and Kick questionnaire    Fear of Current or Ex-Partner: No    Emotionally Abused: No    Physically Abused: No    Sexually Abused: No     Review of Systems    General:  No chills, fever, night sweats or weight changes.  Cardiovascular:  No chest pain but has been having exertional upper back/mid-scapular pain, +++ dyspnea on exertion, no edema, orthopnea, palpitations, paroxysmal nocturnal dyspnea. Dermatological: No rash, lesions/masses Respiratory: No cough, +++ dyspnea Urologic: No hematuria, dysuria Abdominal:   No nausea, vomiting, diarrhea, bright red blood per rectum, melena, or hematemesis Neurologic:  +++ headache.  No visual changes, wkns, changes in mental status. All other systems reviewed and are otherwise negative except as noted above.  Physical Exam    Blood pressure 114/71, pulse 65, temperature 98.7 F (37.1 C), temperature source Oral, resp. rate (!) 21,  height 5\' 6"  (1.676 m), weight 76.2 kg, SpO2 93 %.  General: Pleasant, NAD Psych: Normal affect. Neuro: Alert and oriented X 3. Moves all extremities spontaneously. HEENT: Normal  Neck: Supple without bruits or JVD. Lungs:  Resp regular and unlabored, CTA. Heart: RRR no s3, s4, or murmurs. Abdomen: Soft, non-tender, non-distended, BS + x 4.  Extremities: No clubbing, cyanosis or edema. DP/PT2+, Radials 2+ and equal bilaterally.  Labs    Cardiac Enzymes Recent Labs  Lab 12/22/22 2249  12/23/22 0532  TROPONINIHS 60* 582*     BNP    Component Value Date/Time   BNP 47.0 03/14/2022 1402    Lab Results  Component Value Date   WBC 8.8 12/22/2022   HGB 12.7 12/22/2022   HCT 38.6 12/22/2022   MCV 87.5 12/22/2022   PLT 230 12/22/2022    Recent Labs  Lab 12/23/22 0658  NA 138  K 3.2*  CL 105  CO2 23  BUN 11  CREATININE 0.55  CALCIUM 8.9  PROT 6.3*  BILITOT 0.8  ALKPHOS 65  ALT 17  AST 24  GLUCOSE 121*   Lab Results  Component Value Date   CHOL 163 03/11/2014   HDL 44 03/11/2014   LDLCALC 52 03/11/2014   TRIG 333 (H) 03/11/2014     Radiology Studies    CT Head Wo Contrast  Result Date: 12/22/2022 CLINICAL DATA:  Headache. EXAM: CT HEAD WITHOUT CONTRAST TECHNIQUE: Contiguous axial images were obtained from the base of the skull through the vertex without intravenous contrast. RADIATION DOSE REDUCTION: This exam was performed according to the departmental dose-optimization program which includes automated exposure control, adjustment of the mA and/or kV according to patient size and/or use of iterative reconstruction technique. COMPARISON:  None Available. FINDINGS: Brain: There is mild cerebral atrophy with widening of the extra-axial spaces and ventricular dilatation. There are areas of decreased attenuation within the white matter tracts of the supratentorial brain, consistent with microvascular disease changes. Vascular: No hyperdense vessel or unexpected  calcification. Skull: Normal. Negative for fracture or focal lesion. Sinuses/Orbits: No acute finding. Other: None. IMPRESSION: 1. No acute intracranial abnormality. 2. Generalized cerebral atrophy and microvascular disease changes of the supratentorial brain. Electronically Signed   By: Virgina Norfolk M.D.   On: 12/22/2022 23:45   DG Chest Port 1 View  Result Date: 12/22/2022 CLINICAL DATA:  Hypertension EXAM: PORTABLE CHEST 1 VIEW COMPARISON:  03/16/2022 FINDINGS: Left pacer remains in place, unchanged. Heart and mediastinal contours are within normal limits. No focal opacities or effusions. No acute bony abnormality. Aortic atherosclerosis. IMPRESSION: No active cardiopulmonary disease. Electronically Signed   By: Rolm Baptise M.D.   On: 12/22/2022 23:20   CT Abdomen W Contrast  Result Date: 12/17/2022 CLINICAL DATA:  Follow-up left renal lesion EXAM: CT ABDOMEN WITH CONTRAST TECHNIQUE: Multidetector CT imaging of the abdomen was performed using the standard protocol following bolus administration of intravenous contrast. RADIATION DOSE REDUCTION: This exam was performed according to the departmental dose-optimization program which includes automated exposure control, adjustment of the mA and/or kV according to patient size and/or use of iterative reconstruction technique. CONTRAST:  4mL OMNIPAQUE IOHEXOL 300 MG/ML  SOLN COMPARISON:  CT abdomen dated 12/07/2021 FINDINGS: Lower chest: Partially imaged pacer leads terminate in the right atrium and ventricle. Scattered small 2 mm bibasilar nodules are again seen. No new or enlarging nodules within the lung bases. No pleural effusion or pneumothorax demonstrated. Partially imaged heart size is normal. Hepatobiliary: No focal hepatic lesions. No intra or extrahepatic biliary ductal dilation. Normal gallbladder. Pancreas: Increased conspicuity of 5 mm hypoattenuating focus within the pancreatic neck (2:62). No main ductal dilation, mass lesion, or abnormal  enhancement. Spleen: Normal in size without focal abnormality. Adrenals/Urinary Tract: No adrenal nodules. Unchanged 1.2 x 1.1 cm enhancing exophytic posterior left upper pole lesion (2:56). There is suggestion of a small focus of intrinsic fat attenuation (3:113). Unchanged left lower pole simple/minimally complicated cyst. 1.0 cm anterior right upper pole lesion (2:69) does not demonstrate  measurement consistent with a simple cyst. Subcentimeter right renal hypodensities are too small to characterize but likely cysts. Stomach/Bowel: Again seen is an enhancing nodular focus measuring 1.6 cm at the pylorus (2:66). Small duodenal diverticulum in the third portion. No evidence of bowel wall thickening, distention, or inflammatory changes. Colonic diverticulosis without acute diverticulitis. Vascular/Lymphatic: Aortic atherosclerosis. No enlarged abdominal lymph nodes. Other: No free fluid, fluid collection, or free air. Musculoskeletal: No acute or abnormal lytic or blastic osseous lesions. IMPRESSION: 1. Unchanged size of 1.2 cm enhancing exophytic posterior left upper pole renal lesion with suggestion of intrinsic fat attenuation, which raises the possibility of angiomyolipoma. 2. Indeterminate 1.0 cm anterior right upper pole renal lesion, unchanged from 05/27/2021. Attention on follow-up. 3. Increased conspicuity of 5 mm hypoattenuating focus within the pancreatic neck, which may represent side branch intraductal papillary mucinous neoplasms (IPMN). Recommend follow up pre and post contrast MRI/MRCP or pancreatic protocol CT in 2 years. 4. Enhancing nodular focus in the pylorus may represent a polyp or prominent mucosal fold. Attention on follow-up. Alternatively, endoscopy can be considered for further evaluation. 5.  Aortic Atherosclerosis (ICD10-I70.0). Electronically Signed   By: Darrin Nipper M.D.   On: 12/17/2022 13:39   CUP PACEART REMOTE DEVICE CHECK  Result Date: 12/16/2022 Scheduled remote reviewed.  Normal device function.  28 AMS, AF,  longest duration 2hrs 67min, burden <1%, no OAC.  Route to triage ? contraindicated Next remote 91 days. LA   ECG & Cardiac Imaging    RSR, 67, PACs, A & V pacing on demand - personally reviewed.  Assessment & Plan    1.  NSTEMI:  Pt w/ a several month h/o daily, exertional, mid-scapular/upper back pain, presented to the ED on 3/20 secondary to headache and marked HTN noted @ home (200/110 range).  Initial HsTrop 60  582.  No chest pain/back pain since arrival.  BP improved w/ NTP and IV labetalol.  Multiple RF for CAD including HTN, HL, DMII, Ao atherosclerosis noted on CT chest in 2022.  Will plan on cath today. The patient understands that risks include but are not limited to stroke (1 in 1000), death (1 in 59), kidney failure [usually temporary] (1 in 500), bleeding (1 in 200), allergic reaction [possibly serious] (1 in 200), and agrees to proceed.  Cont heparin.  Resume home doses of asa, statin, ? blocker, arb.  2.  HTN Urgency:  BP markedly elevated @ home.  Trending better here.  Resume home meds - titrate as necessary.  3.  HL:  Resume atorvastatin.  F/u lipids.  4.  PAF:  recently noted on device check.  Currently in sinus w/ PACs and A/V pacing on demand.  Not currently on [place] but CHA2DS2VASc = 8.  Will plan to add eliquis post-cath.  Cont ? blocker.  5.  DMII:  on metformin @ home.  SSI per IM.  6.  Hypokalemia:  supplementation ordered.  7.  L renal mass:  stable on recent CT.  8.  Abnl pancreatic neck finding:  on recent CT - 5 mm hypoattenuating focus within the pancreatic neck, which may represent side branch intraductal papillary mucinous neoplasms.  Rec for f/u in 2 yrs.  9.  High grade AV block:  s/p DC PPM last year.  Nl device fxn.  Risk Assessment/Risk Scores:     TIMI Risk Score for Unstable Angina or Non-ST Elevation MI:   The patient's TIMI risk score is 5, which indicates a 26% risk of all cause  mortality, new or recurrent  myocardial infarction or need for urgent revascularization in the next 14 days.    CHA2DS2-VASc Score = 8   This indicates a 10.8% annual risk of stroke. The patient's score is based upon: CHF History: 0 HTN History: 1 Diabetes History: 1 Stroke History: 2 Vascular Disease History: 1 Age Score: 2 Gender Score: 1     Signed, Murray Hodgkins, NP 12/23/2022, 10:08 AM   ATTENDING ATTESTATION  I have seen, examined and evaluated the patient this PM along with Mr. Sharolyn Douglas.  After reviewing all the available data and chart, we discussed the patients laboratory, study & physical findings as well as symptoms in detail.  I agree with his findings, examination as well as impression recommendations as per our discussion.    Attending adjustments noted in italics.   Patient was seen and evaluated by the attending cardiologist on rounds but will sign.  Presented with hypertensive urgency and elevated troponin concerning for possible non-STEMI.  Plan is for cardiac catheterization today.  Otherwise agree with plan noted above.    Leonie Man, MD, MS Glenetta Hew, M.D., M.S. Interventional Cardiologist  [place]  Pager # [phone] Phone # [phone] [address] [place], [place] [address]    For questions or updates, please contact   Please consult [email or web address] for contact info under Cardiology/STEMI.

## 2022-12-23 NOTE — Interval H&P Note (Signed)
History and Physical Interval Note:  12/23/2022 1:32 PM  Susan [name]  has presented today for surgery, with the diagnosis of nstemi.  The various methods of treatment have been discussed with the patient and family. After consideration of risks, benefits and other options for treatment, the patient has consented to  Procedure(s): LEFT HEART CATH AND CORONARY ANGIOGRAPHY (N/A)  PERCUTANEOUS CORONARY INTERVENTION   as a surgical intervention.  The patient's history has been reviewed, patient examined, no change in status, stable for surgery.  I have reviewed the patient's chart and labs.  Questions were answered to the patient's satisfaction.     Cath Lab Visit (complete for each Cath Lab visit)  Clinical Evaluation Leading to the Procedure:   ACS: Yes.    Non-ACS:    Anginal Classification: CCS III  Anti-ischemic medical therapy: Minimal Therapy (1 class of medications)  Non-Invasive Test Results: No non-invasive testing performed  Prior CABG: No previous CABG    Glenetta Hew

## 2022-12-23 NOTE — ED Notes (Signed)
The pt was assisted with ambulating to the toilet. The pt was undressed and placed in a gown, consent signed.

## 2022-12-23 NOTE — Progress Notes (Signed)
[place] for heparin infusion Indication: ACS/STEMI  No Known Allergies  Patient Measurements: Height: 5\' 6"  (167.6 cm) Weight: 76.2 kg (168 lb) IBW/kg (Calculated) : 59.3 Heparin Dosing Weight: 74.7 kg  Vital Signs: Temp: 97.7 F (36.5 C) (03/21 0337) Temp Source: Oral (03/21 0337) BP: 115/93 (03/21 0630) Pulse Rate: 75 (03/21 0630)  Labs: Recent Labs    12/22/22 2249 12/23/22 0532  HGB 12.7  --   HCT 38.6  --   PLT 230  --   CREATININE 0.60  --   TROPONINIHS 60* 582*    Estimated Creatinine Clearance: 49.7 mL/min (by C-G formula based on SCr of 0.6 mg/dL).   Medical History: Past Medical History:  Diagnosis Date   Colon polyp    Diffuse cystic mastopathy    Hyperlipidemia    Hypertension    Macular degeneration    Tachycardia     Assessment: Pt is a 89 yo female presenting to ED c/o HTN and headache, found with elevated troponin I level, trending up.  Goal of Therapy:  Heparin level 0.3-0.7 units/ml Monitor platelets by anticoagulation protocol: Yes   Plan:  Bolus 4000 units x 1 Start heparin infusion at 950 units/hr Will check HL in 8 hr after start of infusion CBC daily while on heparin  Renda Rolls, PharmD, [facility] 12/23/2022 6:46 AM

## 2022-12-23 NOTE — Assessment & Plan Note (Addendum)
Symptomatic in terms of chest pressure as well as slight headache currently resolved.  Will target a blood pressure of under 140 mmHg given that this is an acute elevation as well as patient is having some troponin leak.  Will check metanephrines, TSH, aldosterone and renin to evaluate this further.  Use as needed labetalol and hydralazine for control.  Restart patient oral medications once the patient's med rec is done.

## 2022-12-23 NOTE — H&P (Addendum)
History and Physical    Patient: Susan [name] [id number] DOB: September 08, 1932 DOA: 12/22/2022 DOS: the patient was seen and examined on 12/23/2022 PCP: Albina Billet, MD  Patient coming from: Home  Chief Complaint:  Chief Complaint  Patient presents with   Hypertension   HPI: Susan [name] is a 89 y.o. female with medical history significant of hypertension,.  Patient was in her usual state of health till earlier today at around 8 PM  when she reports a new onset of mild headache and anterior left-sided chest discomfort.  Chest discomfort was on and off without any particular precipitating or relieving factors noted no radiation.  Mild.  No associated shortness of breath trauma no fever no cough no leg swelling or cramping.  No vision changes no focal weakness no head trauma no loss of consciousness.  Patient checked her blood pressure at approximately 8 PM this evening and noted that it was over [id number] mm of systolic.  EMS was called to patient's home who corroborated patient's blood pressure finding.  Patient is brought to [facility] ER.  Patient's blood pressure has been well-controlled in the ER and patient's actually symptoms have totally resolved, patient is totally asymptomatic at this time.  Medical evaluation is sought.  ER course is also reported for the finding of patient being in atrial fibrillation Review of Systems: As mentioned in the history of present illness. All other systems reviewed and are negative. Past Medical History:  Diagnosis Date   Colon polyp    Diffuse cystic mastopathy    Hyperlipidemia    Hypertension    Macular degeneration    Tachycardia    Past Surgical History:  Procedure Laterality Date   ABDOMINAL HYSTERECTOMY     APPENDECTOMY     BREAST BIOPSY Bilateral 2006   benign, clip markers in both breasts   COLONOSCOPY  05/03/2015   EYE SURGERY Bilateral 2014   cataract   PACEMAKER IMPLANT N/A 03/15/2022   Procedure: PACEMAKER IMPLANT;  Surgeon:  Vickie Epley, MD;  Location: [facility];  Service: Cardiovascular;  Laterality: N/A;   salpingo oophorectmy     Social History:  reports that she has never smoked. She has never used smokeless tobacco. She reports that she does not drink alcohol and does not use drugs.  No Known Allergies  Family History  Problem Relation Age of Onset   Lung cancer Father    Kidney cancer Maternal Aunt    Breast cancer Neg Hx     Prior to Admission medications   Medication Sig Start Date End Date Taking? Authorizing Provider  acetaminophen (TYLENOL) 325 MG tablet Take 1-2 tablets (325-650 mg total) by mouth every 4 (four) hours as needed for mild pain. 03/16/22   Shirley Friar, PA-C  aspirin 81 MG tablet Take 1 tablet (81 mg total) by mouth daily. 03/19/22   Shirley Friar, PA-C  atorvastatin (LIPITOR) 20 MG tablet Take 20 mg by mouth daily.    [provider]  Glycerin-Hypromellose-PEG 400 (VISINE DRY EYE OP) Apply to eye as needed.    [provider]  hydrochlorothiazide (HYDRODIURIL) 12.5 MG tablet Take 12.5 mg by mouth daily. 05/04/17   [provider]  losartan (COZAAR) 100 MG tablet Take 100 mg by mouth every evening. 05/04/17   [provider]  metFORMIN (GLUCOPHAGE) 500 MG tablet Take 500 mg by mouth daily with breakfast. Take 2 tablets by mouth in the morning and one tablet in the evening 05/30/22  [provider]  metoprolol tartrate (LOPRESSOR) 50 MG tablet Take 1 tablet (50 mg total) by mouth in the morning, at noon, and at bedtime. 07/05/22   Vickie Epley, MD  Multiple Vitamins-Minerals (PRESERVISION AREDS 2) CAPS Take 1 capsule by mouth in the morning and at bedtime. Lunch and late afternoon    [provider]    Physical Exam: Vitals:   12/22/22 2137 12/22/22 2138 12/22/22 2300 12/23/22 0015  BP:  (!) 165/90 (!) 148/75 (!) 150/100  Pulse:  65 61 69  Resp:  18 15 19   Temp:  98.1 F (36.7 C)     TempSrc:  Oral    SpO2:  100% 98% 100%  Weight: 76.2 kg     Height: 5\' 6"  (1.676 m)      Patient is alert awake, no distress, gives coherent account of the day Respiratory; bilateral air entry vesicular Cardiovascular exam S1-S2 normal Abdomen soft nontender Extremities warm without edema Skin no rash Data Reviewed:  Labs on Admission:  Results for orders placed or performed during the hospital encounter of 12/22/22 (from the past 24 hour(s))  CBC with Differential/Platelet     Status: None   Collection Time: 12/22/22 10:49 PM  Result Value Ref Range   WBC 8.8 4.0 - 10.5 K/uL   RBC 4.41 3.87 - 5.11 MIL/uL   Hemoglobin 12.7 12.0 - 15.0 g/dL   HCT 38.6 36.0 - 46.0 %   MCV 87.5 80.0 - 100.0 fL   MCH 28.8 26.0 - 34.0 pg   MCHC 32.9 30.0 - 36.0 g/dL   RDW 12.7 11.5 - 15.5 %   Platelets 230 150 - 400 K/uL   nRBC 0.0 0.0 - 0.2 %   Neutrophils Relative % 54 %   Neutro Abs 4.8 1.7 - 7.7 K/uL   Lymphocytes Relative 35 %   Lymphs Abs 3.1 0.7 - 4.0 K/uL   Monocytes Relative 8 %   Monocytes Absolute 0.7 0.1 - 1.0 K/uL   Eosinophils Relative 2 %   Eosinophils Absolute 0.1 0.0 - 0.5 K/uL   Basophils Relative 1 %   Basophils Absolute 0.1 0.0 - 0.1 K/uL   Immature Granulocytes 0 %   Abs Immature Granulocytes 0.02 0.00 - 0.07 K/uL  Comprehensive metabolic panel     Status: Abnormal   Collection Time: 12/22/22 10:49 PM  Result Value Ref Range   Sodium 134 (L) 135 - 145 mmol/L   Potassium 3.6 3.5 - 5.1 mmol/L   Chloride 104 98 - 111 mmol/L   CO2 20 (L) 22 - 32 mmol/L   Glucose, Bld 133 (H) 70 - 99 mg/dL   BUN 14 8 - 23 mg/dL   Creatinine, Ser 0.60 0.44 - 1.00 mg/dL   Calcium 9.1 8.9 - 10.3 mg/dL   Total Protein 6.9 6.5 - 8.1 g/dL   Albumin 4.2 3.5 - 5.0 g/dL   AST 19 15 - 41 U/L   ALT 17 0 - 44 U/L   Alkaline Phosphatase 72 38 - 126 U/L   Total Bilirubin 0.9 0.3 - 1.2 mg/dL   GFR, Estimated >60 >60 mL/min   Anion gap 10 5 - 15  Troponin I (High Sensitivity)     Status:  Abnormal   Collection Time: 12/22/22 10:49 PM  Result Value Ref Range   Troponin I (High Sensitivity) 60 (H) <18 ng/L   Radiological Exams on Admission:  CT Head Wo Contrast  Result Date: 12/22/2022 CLINICAL DATA:  Headache. EXAM: CT HEAD  WITHOUT CONTRAST TECHNIQUE: Contiguous axial images were obtained from the base of the skull through the vertex without intravenous contrast. RADIATION DOSE REDUCTION: This exam was performed according to the departmental dose-optimization program which includes automated exposure control, adjustment of the mA and/or kV according to patient size and/or use of iterative reconstruction technique. COMPARISON:  None Available. FINDINGS: Brain: There is mild cerebral atrophy with widening of the extra-axial spaces and ventricular dilatation. There are areas of decreased attenuation within the white matter tracts of the supratentorial brain, consistent with microvascular disease changes. Vascular: No hyperdense vessel or unexpected calcification. Skull: Normal. Negative for fracture or focal lesion. Sinuses/Orbits: No acute finding. Other: None. IMPRESSION: 1. No acute intracranial abnormality. 2. Generalized cerebral atrophy and microvascular disease changes of the supratentorial brain. Electronically Signed   By: Virgina Norfolk M.D.   On: 12/22/2022 23:45   DG Chest Port 1 View  Result Date: 12/22/2022 CLINICAL DATA:  Hypertension EXAM: PORTABLE CHEST 1 VIEW COMPARISON:  03/16/2022 FINDINGS: Left pacer remains in place, unchanged. Heart and mediastinal contours are within normal limits. No focal opacities or effusions. No acute bony abnormality. Aortic atherosclerosis. IMPRESSION: No active cardiopulmonary disease. Electronically Signed   By: Rolm Baptise M.D.   On: 12/22/2022 23:20    EKG: Independently reviewed. There is P waaves, therefore, I do not thjink that we have pure afib in this strip. Likely sinus with PAC. Await cardiac reaad of EKG.    Assessment and  Plan: * Hypertensive urgency Symptomatic in terms of chest pressure as well as slight headache currently resolved.  Will target a blood pressure of under 140 mmHg given that this is an acute elevation as well as patient is having some troponin leak.  Will check metanephrines, TSH, aldosterone and renin to evaluate this further.  Use as needed labetalol and hydralazine for control.  Restart patient oral medications once the patient's med rec is done.  Abnormal heart rhythm Follow-up cardiac review of EKG, at this time I do not think we caught an EKG with A-fib.  Troponin I above reference range This is flat in terms of her previous troponin in the chart from 9 months ago.  We will check the troponin again in the morning make sure it is staying stable.  Headache , Was mild, resolved, related to hypertensive urgency.  At this time monitoring clinically and CT head is not showing any acute abnormality,  Chest pain Resolved.  Likely related to hypertensive urgency, troponin positive, trend      Advance Care Planning:   Code Status: Prior full code  Consults: None  Family Communication: Patient's son will be at the bedside tomorrow  Severity of Illness: The appropriate patient status for this patient is INPATIENT. Inpatient status is judged to be reasonable and necessary in order to provide the required intensity of service to ensure the patient's safety. The patient's presenting symptoms, physical exam findings, and initial radiographic and laboratory data in the context of their chronic comorbidities is felt to place them at high risk for further clinical deterioration. Furthermore, it is not anticipated that the patient will be medically stable for discharge from the hospital within 2 midnights of admission.   * I certify that at the point of admission it is my clinical judgment that the patient will require inpatient hospital care spanning beyond 2 midnights from the point of admission due  to high intensity of service, high risk for further deterioration and high frequency of surveillance required.*  Author: Alisa Graff  Elvia Collum, MD 12/23/2022 12:47 AM  For on call review [email or web address].

## 2022-12-24 ENCOUNTER — Other Ambulatory Visit (HOSPITAL_COMMUNITY): Payer: Self-pay

## 2022-12-24 ENCOUNTER — Inpatient Hospital Stay (HOSPITAL_COMMUNITY)
Admit: 2022-12-24 | Discharge: 2022-12-24 | Disposition: A | Discharge status: Home / Self Care | Payer: Medicare Other | Attending: Internal Medicine | Admitting: Internal Medicine

## 2022-12-24 ENCOUNTER — Encounter: Payer: Self-pay | Admitting: Cardiology

## 2022-12-24 DIAGNOSIS — I5181 Takotsubo syndrome: Principal | ICD-10-CM

## 2022-12-24 DIAGNOSIS — I214 Non-ST elevation (NSTEMI) myocardial infarction: Secondary | ICD-10-CM

## 2022-12-24 DIAGNOSIS — I2489 Other forms of acute ischemic heart disease: Secondary | ICD-10-CM

## 2022-12-24 LAB — ECHOCARDIOGRAM COMPLETE
AR max vel: 1.62 cm2
AV Area VTI: 1.69 cm2
AV Area mean vel: 1.59 cm2
AV Mean grad: 8 mmHg
AV Peak grad: 17 mmHg
Ao pk vel: 2.06 m/s
Area-P 1/2: 4.65 cm2
Calc EF: 42.8 %
Height: 66 in
MV VTI: 2.61 cm2
S' Lateral: 3 cm
Single Plane A2C EF: 42.2 %
Single Plane A4C EF: 44.5 %
Weight: 2687.85 oz

## 2022-12-24 LAB — GLUCOSE, CAPILLARY: Glucose-Capillary: 190 mg/dL — ABNORMAL HIGH (ref 70–99)

## 2022-12-24 MED ORDER — PROSIGHT PO TABS
1.0000 | ORAL_TABLET | Freq: Every day | ORAL | Status: DC
  Administered 2022-12-24: 1 via ORAL
  Filled 2022-12-24: qty 1

## 2022-12-24 MED ORDER — APIXABAN 5 MG PO TABS
5.0000 mg | ORAL_TABLET | Freq: Two times a day (BID) | ORAL | Status: DC
  Administered 2022-12-24: 5 mg via ORAL
  Filled 2022-12-24: qty 1

## 2022-12-24 MED ORDER — POTASSIUM CHLORIDE CRYS ER 20 MEQ PO TBCR
40.0000 meq | EXTENDED_RELEASE_TABLET | Freq: Two times a day (BID) | ORAL | Status: DC
  Administered 2022-12-24: 40 meq via ORAL
  Filled 2022-12-24: qty 2

## 2022-12-24 MED ORDER — METOPROLOL SUCCINATE ER 100 MG PO TB24
100.0000 mg | ORAL_TABLET | Freq: Every day | ORAL | Status: DC
  Filled 2022-12-24: qty 1

## 2022-12-24 MED ORDER — SACUBITRIL-VALSARTAN 24-26 MG PO TABS: 1.0000 | ORAL_TABLET | Freq: Two times a day (BID) | ORAL | 1 refills | Status: DC

## 2022-12-24 MED ORDER — LOSARTAN POTASSIUM 50 MG PO TABS: 50.0000 mg | ORAL_TABLET | Freq: Every evening | ORAL | Status: DC

## 2022-12-24 MED ORDER — SACUBITRIL-VALSARTAN 24-26 MG PO TABS
1.0000 | ORAL_TABLET | Freq: Two times a day (BID) | ORAL | Status: DC
  Administered 2022-12-24: 1 via ORAL
  Filled 2022-12-24: qty 1

## 2022-12-24 MED ORDER — APIXABAN 5 MG PO TABS: 5.0000 mg | ORAL_TABLET | Freq: Two times a day (BID) | ORAL | 2 refills | Status: DC

## 2022-12-24 MED ORDER — METOPROLOL TARTRATE 50 MG PO TABS
50.0000 mg | ORAL_TABLET | Freq: Three times a day (TID) | ORAL | Status: DC
  Administered 2022-12-24: 50 mg via ORAL
  Filled 2022-12-24: qty 1

## 2022-12-24 NOTE — TOC Benefit Eligibility Note (Signed)
Patient Teacher, English as a foreign language completed.    The patient is currently admitted and upon discharge could be taking Eliquis 5 mg.  The current 30 day co-pay is $120.00.   The patient is currently admitted and upon discharge could be taking Entresto 24-26 mg.  The current 30 day co-pay is $120.00.   The patient is insured through [employer] Part d   This test claim was processed through [place] amounts may vary at other pharmacies due to pharmacy/plan contracts, or as the patient moves through the different stages of their insurance plan.  Lyndel Safe, [place] Patient Advocate Specialist [place] Patient Advocate Team Direct Number: [phone]  Fax: [phone]

## 2022-12-24 NOTE — Discharge Summary (Signed)
Physician Discharge Summary   Patient: Susan [name] MRN: [id number] DOB: [birth year]  Admit date:     12/22/2022  Discharge date: 12/24/22  Discharge Physician: Fritzi Mandes   PCP: Albina Billet, MD   Recommendations at discharge:    F/u Dr Hall Busing in 1-2 weeks F/u Dr Quentin Ore on your appt next week  Discharge Diagnoses: Principal Problem:   Hypertensive urgency Active Problems:   Chest pain   Headache   Troponin I above reference range   Abnormal heart rhythm   Elevated troponin   New onset atrial fibrillation [facility])   Demand ischemia of myocardium   Non-ST elevation (NSTEMI) myocardial infarction [facility])  Susan [name] is a 89 y.o. female with medical history significant of hypertension,.  Patient was in her usual state of health till earlier today at around 8 PM  when she reports a new onset of mild headache and anterior left-sided chest discomfort.  Chest discomfort was on and off without any particular precipitating or relieving factors noted no radiation.  BP was elevated in ER   Hypertensive urgency --Symptomatic in terms of chest pressure as well as slight headache currently resolved.   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 Will discharge to home. Patient in agreement.     Procedures:LHC Family communication :none today Consults :[facility] cardiology CODE STATUS: FULL DVT Prophylaxis :heparin Disposition: Home Diet recommendation:  Discharge  Diet Orders (From admission, onward)     Start     Ordered   12/24/22 0000  Diet - low sodium heart healthy        12/24/22 1135   12/24/22 0000  Diet Carb Modified        12/24/22 1135           Cardiac and Carb modified diet DISCHARGE MEDICATION: Allergies as of 12/24/2022   No Known Allergies      Medication List     STOP taking these medications    aspirin 81 MG tablet   hydrochlorothiazide 12.5 MG tablet Commonly known as: HYDRODIURIL   losartan 100 MG tablet Commonly known as: COZAAR       TAKE these medications    acetaminophen 325 MG tablet Commonly known as: TYLENOL Take 1-2 tablets (325-650 mg total) by mouth every 4 (four) hours as needed for mild pain.   apixaban 5 MG Tabs tablet Commonly known as: ELIQUIS Take 1 tablet (5 mg total) by mouth 2 (two) times daily.   atorvastatin 20 MG tablet Commonly known as: LIPITOR Take 20 mg by mouth daily.   clopidogrel 75 MG tablet Commonly known as: PLAVIX Take 75 mg by mouth daily.   metFORMIN 500 MG tablet Commonly known as: GLUCOPHAGE Take 500 mg by mouth daily with breakfast. Take 2 tablets by mouth in the morning and one tablet in the evening   metoprolol tartrate 50 MG tablet Commonly known as: LOPRESSOR Take 1 tablet (50 mg total) by mouth in the morning, at noon, and at bedtime.   PreserVision  AREDS 2 Caps Take 1 capsule by mouth in the morning and at bedtime. Lunch and late afternoon   sacubitril-valsartan 24-26 MG Commonly known as: ENTRESTO Take 1 tablet by mouth 2 (two) times daily.   VISINE DRY EYE OP Apply to eye as needed.        Follow-up Information     Albina Billet, MD. Schedule an appointment as soon as possible for a visit in 1 week(s).   Specialty: Internal Medicine Contact information: [place] [address] [phone]         Vickie Epley, MD. Go to.   Specialties: Cardiology, Radiology Why: your appt next week Contact  information: [place] [address] [phone]                 [place] Weights   12/22/22 2137 12/23/22 1149  Weight: 76.2 kg 76.2 kg     Condition at discharge: fair  The results of significant diagnostics from this hospitalization (including imaging, microbiology, ancillary and laboratory) are listed below for reference.   Imaging Studies: CARDIAC CATHETERIZATION  Result Date: 12/23/2022   Mid LAD lesion is 45% stenosed with 40% stenosed side branch in 2nd Diag.  Otherwise normal coronary arteries   There is moderate left ventricular systolic dysfunction.   LV end diastolic pressure is mildly elevated.   The left ventricular ejection fraction is 35-45% by visual estimate. POST-CATH DIAGNOSES LV gram with moderate reduced EF of 40 to 45% with wall motion abnormality suggestive of Takotsubo cardiomyopathY Angiographically minimal CAD with a tapering moderate to small caliber LAD with a 50% stenosis at 2nd Diag and then tapers at that same caliber throughout the rest the vessel and does not reach the apex. Large dominant RCA with large PDA making up for the small LAD LCx system is made above Ramus Intermedius the course of the 1st Mrg an the Cx courses as a 2nd Mrg RECOMMENDATIONS Okay to DC heparin and nitroglycerin With treat hypertension. Glenetta Hew, MD  CT Head Wo Contrast  Result Date: 12/22/2022 CLINICAL DATA:  Headache. EXAM: CT HEAD WITHOUT CONTRAST TECHNIQUE: Contiguous axial images were obtained from the base of the skull through the vertex without intravenous contrast. RADIATION DOSE REDUCTION: This exam was performed according to the departmental dose-optimization program which includes automated exposure control, adjustment of the mA and/or kV according to patient size and/or use of iterative reconstruction technique. COMPARISON:  None Available. FINDINGS: Brain: There is mild cerebral atrophy with widening of the extra-axial spaces and  ventricular dilatation. There are areas of decreased attenuation within the white matter tracts of the supratentorial brain, consistent with microvascular disease changes. Vascular: No hyperdense vessel or unexpected calcification. Skull: Normal. Negative for fracture or focal lesion. Sinuses/Orbits: No acute finding. Other: None. IMPRESSION: 1. No acute intracranial abnormality. 2. Generalized cerebral atrophy and microvascular disease changes of the supratentorial brain. Electronically Signed   By: Virgina Norfolk M.D.   On: 12/22/2022 23:45   DG Chest Port 1 View  Result Date: 12/22/2022 CLINICAL DATA:  Hypertension EXAM: PORTABLE CHEST 1 VIEW COMPARISON:  03/16/2022 FINDINGS: Left pacer remains in place, unchanged. Heart and mediastinal contours are within normal limits. No focal opacities or effusions. No acute bony abnormality. Aortic atherosclerosis. IMPRESSION: No active cardiopulmonary disease. Electronically Signed   By: Rolm Baptise M.D.   On: 12/22/2022 23:20   CT Abdomen W Contrast  Result Date: 12/17/2022 CLINICAL DATA:  Follow-up left  renal lesion EXAM: CT ABDOMEN WITH CONTRAST TECHNIQUE: Multidetector CT imaging of the abdomen was performed using the standard protocol following bolus administration of intravenous contrast. RADIATION DOSE REDUCTION: This exam was performed according to the departmental dose-optimization program which includes automated exposure control, adjustment of the mA and/or kV according to patient size and/or use of iterative reconstruction technique. CONTRAST:  56mL OMNIPAQUE IOHEXOL 300 MG/ML  SOLN COMPARISON:  CT abdomen dated 12/07/2021 FINDINGS: Lower chest: Partially imaged pacer leads terminate in the right atrium and ventricle. Scattered small 2 mm bibasilar nodules are again seen. No new or enlarging nodules within the lung bases. No pleural effusion or pneumothorax demonstrated. Partially imaged heart size is normal. Hepatobiliary: No focal hepatic lesions.  No intra or extrahepatic biliary ductal dilation. Normal gallbladder. Pancreas: Increased conspicuity of 5 mm hypoattenuating focus within the pancreatic neck (2:62). No main ductal dilation, mass lesion, or abnormal enhancement. Spleen: Normal in size without focal abnormality. Adrenals/Urinary Tract: No adrenal nodules. Unchanged 1.2 x 1.1 cm enhancing exophytic posterior left upper pole lesion (2:56). There is suggestion of a small focus of intrinsic fat attenuation (3:113). Unchanged left lower pole simple/minimally complicated cyst. 1.0 cm anterior right upper pole lesion (2:69) does not demonstrate measurement consistent with a simple cyst. Subcentimeter right renal hypodensities are too small to characterize but likely cysts. Stomach/Bowel: Again seen is an enhancing nodular focus measuring 1.6 cm at the pylorus (2:66). Small duodenal diverticulum in the third portion. No evidence of bowel wall thickening, distention, or inflammatory changes. Colonic diverticulosis without acute diverticulitis. Vascular/Lymphatic: Aortic atherosclerosis. No enlarged abdominal lymph nodes. Other: No free fluid, fluid collection, or free air. Musculoskeletal: No acute or abnormal lytic or blastic osseous lesions. IMPRESSION: 1. Unchanged size of 1.2 cm enhancing exophytic posterior left upper pole renal lesion with suggestion of intrinsic fat attenuation, which raises the possibility of angiomyolipoma. 2. Indeterminate 1.0 cm anterior right upper pole renal lesion, unchanged from 05/27/2021. Attention on follow-up. 3. Increased conspicuity of 5 mm hypoattenuating focus within the pancreatic neck, which may represent side branch intraductal papillary mucinous neoplasms (IPMN). Recommend follow up pre and post contrast MRI/MRCP or pancreatic protocol CT in 2 years. 4. Enhancing nodular focus in the pylorus may represent a polyp or prominent mucosal fold. Attention on follow-up. Alternatively, endoscopy can be considered for  further evaluation. 5.  Aortic Atherosclerosis (ICD10-I70.0). Electronically Signed   By: Darrin Nipper M.D.   On: 12/17/2022 13:39   CUP PACEART REMOTE DEVICE CHECK  Result Date: 12/16/2022 Scheduled remote reviewed. Normal device function.  60 AMS, AF,  longest duration 2hrs 10min, burden <1%, no OAC.  Route to triage ? contraindicated Next remote 91 days. [place]   Microbiology: Results for orders placed or performed during the hospital encounter of 03/15/22  Surgical PCR screen     Status: None   Collection Time: 03/15/22  2:20 PM   Specimen: Nasal Mucosa; Nasal Swab  Result Value Ref Range Status   MRSA, PCR NEGATIVE NEGATIVE Final   Staphylococcus aureus NEGATIVE NEGATIVE Final    Comment: (NOTE) The Xpert SA Assay (FDA approved for NASAL specimens in patients 89 years of age and older), is one component of a comprehensive surveillance program. It is not intended to diagnose infection nor to guide or monitor treatment. Performed at [facility], [place] [address] [place], [place] [address]     Labs: CBC: Recent Labs  Lab 12/22/22 2249 12/23/22 1504  WBC 8.8 6.3  NEUTROABS 4.8  --  HGB 12.7 10.5*  HCT 38.6 32.8*  MCV 87.5 89.4  PLT 230 [id number]   Basic Metabolic Panel: Recent Labs  Lab 12/22/22 2249 12/23/22 0658 12/23/22 1504  NA 134* 138  --   K 3.6 3.2* 3.2*  CL 104 105  --   CO2 20* 23  --   GLUCOSE 133* 121*  --   BUN 14 11  --   CREATININE 0.60 0.55 0.47  CALCIUM 9.1 8.9  --    Liver Function Tests: Recent Labs  Lab 12/22/22 2249 12/23/22 0658  AST 19 24  ALT 17 17  ALKPHOS 72 65  BILITOT 0.9 0.8  PROT 6.9 6.3*  ALBUMIN 4.2 3.9   CBG: Recent Labs  Lab 12/23/22 1153 12/23/22 1257 12/23/22 1717 12/23/22 2114 12/24/22 0927  GLUCAP 129* 127* 182* 128* 190*    Discharge time spent: greater than 30 minutes.  Signed: Fritzi Mandes, MD [facility] 12/24/2022

## 2022-12-24 NOTE — Progress Notes (Signed)
*  PRELIMINARY RESULTS* Echocardiogram 2D Echocardiogram has been performed.  Susan [name] 12/24/2022, 10:25 AM

## 2022-12-24 NOTE — TOC Initial Note (Addendum)
Transition of Care [facility]) - Progression Note    Patient Details  Name: Susan [name] MRN: [id number] Date of Birth: 03-10-1932  Transition of Care [facility]) CM/SW [place], RN Phone Number: 12/24/2022, 12:54 PM  Clinical Narrative:     Transition of Care [facility]) Screening Note   Patient Details  Name: Susan [name] Date of Birth: 17-Apr-1932   Transition of Care [facility]) CM/SW Contact:    Laurena Slimmer, RN Phone Number: 12/24/2022, 12:55 PM    Transition of Care Department [facility]) has reviewed patient and no TOC needs have been identified at this time. We will continue to monitor patient advancement through interdisciplinary progression rounds. If new patient transition needs arise, please place a TOC consult.          Expected Discharge Plan and Services         Expected Discharge Date: 12/24/22                                     Social Determinants of Health (SDOH) Interventions SDOH Screenings   Food Insecurity: No Food Insecurity (12/23/2022)  Housing: Low Risk  (12/23/2022)  Transportation Needs: No Transportation Needs (12/23/2022)  Utilities: Not At Risk (12/23/2022)  Tobacco Use: Low Risk  (12/24/2022)    Readmission Risk Interventions     No data to display

## 2022-12-24 NOTE — Progress Notes (Signed)
D/C instructions and med list reviewed with Vikki Ports, Therapist, sports.  IV and tele removed.  Pt's daughter just arrived to the unit.  Pt Dc'd via [place] without incident

## 2022-12-24 NOTE — Progress Notes (Signed)
Cardiology Progress Note   Patient Name: Susan [name] Date of Encounter: 12/24/2022  Primary Cardiologist: Kathlyn Sacramento, MD  Subjective   Feels well this AM.  No chest pain or sob. Has ambulated some in the room w/o difficulty.  Inpatient Medications    Scheduled Meds:  apixaban  5 mg Oral BID   atorvastatin  20 mg Oral Daily   clopidogrel  75 mg Oral Daily   insulin aspart  0-5 Units Subcutaneous QHS   insulin aspart  0-9 Units Subcutaneous TID WC   metoprolol tartrate  50 mg Oral TID   multivitamin  1 tablet Oral Daily   potassium chloride  40 mEq Oral BID   sacubitril-valsartan  1 tablet Oral BID   sodium chloride flush  3 mL Intravenous Q12H   sodium chloride flush  3 mL Intravenous Q12H   sodium chloride flush  3 mL Intravenous Q12H   Continuous Infusions:  sodium chloride     PRN Meds: sodium chloride, acetaminophen **OR** acetaminophen, hydrALAZINE, labetalol, sodium chloride flush   Vital Signs    Vitals:   12/23/22 1957 12/24/22 0032 12/24/22 0526 12/24/22 0932  BP: 116/69 118/71 131/60 135/79  Pulse: 84 72 75 89  Resp: 18 18 20 20   Temp: 97.9 F (36.6 C) 99.3 F (37.4 C) 98 F (36.7 C) 98.6 F (37 C)  TempSrc:    Oral  SpO2: 99% 98% 98% 99%  Weight:      Height:        Intake/Output Summary (Last 24 hours) at 12/24/2022 1013 Last data filed at 12/23/2022 1936 Gross per 24 hour  Intake 480 ml  Output --  Net 480 ml   Filed Weights   12/22/22 2137 12/23/22 1149  Weight: 76.2 kg 76.2 kg    Physical Exam   GEN: Well nourished, well developed, in no acute distress.  HEENT: Grossly normal.  Neck: Supple, no JVD, carotid bruits, or masses. Cardiac: RRR, no murmurs, rubs, or gallops. No clubbing, cyanosis, edema.  Radials 2+, DP/PT 2+ and equal bilaterally.  R radial cath site w/o bleeding/bruit/hematoma. Respiratory:  Respirations regular and unlabored, clear to auscultation bilaterally. GI: Soft, nontender, nondistended, BS + x  4. MS: no deformity or atrophy. Skin: warm and dry, no rash. Neuro:  Strength and sensation are intact. Psych: AAOx3.  Normal affect.  Labs    Chemistry Recent Labs  Lab 12/22/22 2249 12/23/22 0658 12/23/22 1504  NA 134* 138  --   K 3.6 3.2* 3.2*  CL 104 105  --   CO2 20* 23  --   GLUCOSE 133* 121*  --   BUN 14 11  --   CREATININE 0.60 0.55 0.47  CALCIUM 9.1 8.9  --   PROT 6.9 6.3*  --   ALBUMIN 4.2 3.9  --   AST 19 24  --   ALT 17 17  --   ALKPHOS 72 65  --   BILITOT 0.9 0.8  --   GFRNONAA >60 >60 >60  ANIONGAP 10 10  --      Hematology Recent Labs  Lab 12/22/22 2249 12/23/22 1504  WBC 8.8 6.3  RBC 4.41 3.67*  HGB 12.7 10.5*  HCT 38.6 32.8*  MCV 87.5 89.4  MCH 28.8 28.6  MCHC 32.9 32.0  RDW 12.7 12.8  PLT 230 187    Cardiac Enzymes  Recent Labs  Lab 12/22/22 2249 12/23/22 0532 12/23/22 1050  TROPONINIHS 60* 582* 383*  BNP    Component Value Date/Time   BNP 47.0 03/14/2022 1402   Lipids  Lab Results  Component Value Date   CHOL 144 12/23/2022   HDL 46 12/23/2022   LDLCALC 71 12/23/2022   TRIG 133 12/23/2022   CHOLHDL 3.1 12/23/2022    HbA1c  Lab Results  Component Value Date   HGBA1C 7.0 (H) 12/23/2022    Radiology    CT Head Wo Contrast  Result Date: 12/22/2022 CLINICAL DATA:  Headache. EXAM: CT HEAD WITHOUT CONTRAST TECHNIQUE: Contiguous axial images were obtained from the base of the skull through the vertex without intravenous contrast. RADIATION DOSE REDUCTION: This exam was performed according to the departmental dose-optimization program which includes automated exposure control, adjustment of the mA and/or kV according to patient size and/or use of iterative reconstruction technique. COMPARISON:  None Available. FINDINGS: Brain: There is mild cerebral atrophy with widening of the extra-axial spaces and ventricular dilatation. There are areas of decreased attenuation within the white matter tracts of the supratentorial  brain, consistent with microvascular disease changes. Vascular: No hyperdense vessel or unexpected calcification. Skull: Normal. Negative for fracture or focal lesion. Sinuses/Orbits: No acute finding. Other: None. IMPRESSION: 1. No acute intracranial abnormality. 2. Generalized cerebral atrophy and microvascular disease changes of the supratentorial brain. Electronically Signed   By: Virgina Norfolk M.D.   On: 12/22/2022 23:45   DG Chest Port 1 View  Result Date: 12/22/2022 CLINICAL DATA:  Hypertension EXAM: PORTABLE CHEST 1 VIEW COMPARISON:  03/16/2022 FINDINGS: Left pacer remains in place, unchanged. Heart and mediastinal contours are within normal limits. No focal opacities or effusions. No acute bony abnormality. Aortic atherosclerosis. IMPRESSION: No active cardiopulmonary disease. Electronically Signed   By: Rolm Baptise M.D.   On: 12/22/2022 23:20    Telemetry    RSR - Personally Reviewed  Cardiac Studies   Cardiac Catheterization  3.21.2024  Diagnostic Dominance: Right  Ejection fraction is 35-45%  POST-CATH DIAGNOSES LV gram with moderate reduced EF of 40 to 45% with wall motion abnormality suggestive of Takotsubo cardiomyopathY Angiographically minimal CAD with a tapering moderate to small caliber LAD with a 50% stenosis at 2nd Diag and then tapers at that same caliber throughout the rest the vessel and does not reach the apex. Large dominant RCA with large PDA making up for the small LAD LCx system is made above Ramus Intermedius the course of the 1st Mrg an the Cx courses as a 2nd Mrg  _____________   2D Echocardiogram 3.22.2024  Pending _____________  Patient Profile     89 y/o ? w/ a h/o HTN, HL, DMII, diast dysfxn, mobitz 2 s/p dual-chamber PPM, PSVT, OSA on CPAP, and L renal mass, who presented to the ED on the evening of 3/20 w/ headache, HTN urgency, and elevated HsTrop.   Assessment & Plan    1.  Demand Ischemia vs NSTEMI/Non-obstructive CAD:  Pt presented  w/ headache and hypertensive urgency and found to have elevated HsTroponin to 582.  Also reported several month h/o exertional upper back/mid-scapular pain.  Cath showed mild to mod LAD dzs w/ otw nl cors.  EF 35-45% w/ apical AK suggestive of takotsubo CM.  No c/p or dyspnea this AM.  Has ambulated some w/o difficulty.  Placed on plavix in the setting of ACS.  Will d/c ASA as she was recently noted to have PAF on remote monitoring and will require initiation of eliquis (see below).  Cont ? blocker and statin rx.  2.  Takotsubo  Cardiomyopathy:  As above, cath findings most consistent w/ takotsubo CM.  Echo pending.  EF 35-45% by LV gram.  LVEDP was mildly elevated.  Euvolemic on exam.  Switching home dose of losartan to entresto.  Discussed switching from metoprolol tartrate to succinate, however, she prev tried succinate in 06/2022 and felt that it was inadequate in controlling her BP, and prefers to remain on tartrate for now.  Will readdress in outpt setting.  If BP allows, will also look to add spiro as outpt.  Consider SGLT2i.  3.  HTN Urgency:  BP has been well controlled since presentation to the ED.  As above, changing losartan to entresto.  Cont ? blocker.  Look to add spiro.  4.  HL:  LDL 71.  In setting of ACS, will increase atorva to 40.  5.  PAF:  Recently noted on remote device monitoring.  Quiescent.  Cont ? blocker.  Adding eliquis, which is new for her.  D/c ASA.  6.  DMII:  Per IM.  7.  Hypokalemia:  K 3.2 again this AM.  Will supplement.  8.  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  Nl device fxn.  Signed, Murray Hodgkins, NP  12/24/2022, 10:13 AM    For questions or updates, please contact   Please consult [email or web address] for contact info under Cardiology/STEMI.

## 2022-12-26 LAB — LIPOPROTEIN A (LPA): Lipoprotein (a): 40.6 nmol/L — ABNORMAL HIGH (ref <75.0)

## 2022-12-26 NOTE — Progress Notes (Unsigned)
  Electrophysiology Office Follow up Visit Note:    Date:  12/29/2022   ID:  Susan [name], DOB Dec 03, 1931, MRN [id number]  PCP:  Albina Billet, MD  [facility] Cardiologist:  Kathlyn Sacramento, MD  [facility] Electrophysiologist:  Vickie Epley, MD    Interval History:    Susan [name] is a 89 y.o. female who presents for a follow up visit.   I last saw the patient June 23, 2022 for follow-up after permanent pacemaker was implanted March 15, 2022 for complete heart block.  Her permanent pacemaker has been functioning appropriately.  Remote interrogations of her pacemaker recently have shown salvos of atrial fibrillation.  The longest episode on the December 16, 2022 interrogation was 2 hours and 44 minutes.  She was admitted to the hospital March 20 through December 24, 2022 with chest discomfort.  This was thought to be secondary to hypertensive urgency.  She was started on Eliquis during that hospitalization.  Since leaving the hospital, she has been doing okay.  She is tolerating the Eliquis.      Past medical, surgical, social and family history were reviewed.  ROS:   Please see the history of present illness.    All other systems reviewed and are negative.  EKGs/Labs/Other Studies Reviewed:    The following studies were reviewed today:  December 29, 2022 in clinic device interrogation personally reviewed Battery longevity 9.9 years Lead parameter stable Atrial pacing 14% Ventricular pacing 96% Atrial fibrillation burden less than 1% although there is an episode lasting 2 hours and 43 minutes No programming changes made today   Physical Exam:    VS:  BP 122/80   Pulse 78   Ht 5\' 6"  (1.676 m)   Wt 165 lb (74.8 kg)   SpO2 97%   BMI 26.63 kg/m     Wt Readings from Last 3 Encounters:  12/29/22 165 lb (74.8 kg)  12/23/22 167 lb 15.9 oz (76.2 kg)  06/23/22 170 lb (77.1 kg)     GEN:  Well nourished, well developed in no acute distress CARDIAC:  RRR, no murmurs, rubs, gallops.  Prepectoral pocket well-healed RESPIRATORY:  Clear to auscultation without rales, wheezing or rhonchi       ASSESSMENT:    1. Heart block AV complete (HCC)   2. Cardiac pacemaker in situ   3. Paroxysmal atrial fibrillation (HCC)   4. Primary hypertension    PLAN:    In order of problems listed above:  #Complete heart block #Permanent pacemaker in situ Device functioning appropriately.  Continue remote monitoring.  #Paroxysmal atrial fibrillation Low burden.  Continue monitoring burden using permanent pacemaker Continue Eliquis for stroke prophylaxis  #Hypertension At goal today.  Recommend checking blood pressures 1-2 times per week at home and recording the values.  Recommend bringing these recordings to the primary care physician.   Follow-up 6 months with EP APP.       Signed, Lars Mage, MD, [facility], [facility] 12/29/2022 9:17 PM    Electrophysiology [facility]

## 2022-12-27 ENCOUNTER — Telehealth: Payer: Self-pay | Admitting: Cardiology

## 2022-12-27 ENCOUNTER — Telehealth: Payer: Self-pay

## 2022-12-27 NOTE — Telephone Encounter (Signed)
Pt c/o swelling: STAT is pt has developed SOB within 24 hours  How much weight have you gained and in what time span? No, not gained any weight. Weighs the same yesterday and she did the day prior.  If swelling, where is the swelling located? Just in her feet  Are you currently taking a fluid pill? No  Are you currently SOB? No  Do you have a log of your daily weights (if so, list)? No  Have you gained 3 pounds in a day or 5 pounds in a week? N/a  Have you traveled recently? No   Pt c/o medication issue:  1. Name of Medication: apixaban (ELIQUIS) 5 MG TABS tablet                sacubitril-valsartan (ENTRESTO) 24-26 MG   2. How are you currently taking this medication (dosage and times per day)? N/a as of now  3. Are you having a reaction (difficulty breathing--STAT)? No  4. What is your medication issue? Pt states that since she has been taking the 2 medications listed above, her foot swelling has gotten bad. States she feels like "she's treading water". She states that she will not be taking the dosage for today and Tuesday. She says she thinks the eliquis, entresto, and the plavix are all bp meds and she thinks it is too much. She says that her HR this morning was 93 with her BP 137/99. She then took her plavix and metoprolol. Then at noon, her HR was 76 and her BP was 139/84.

## 2022-12-27 NOTE — Telephone Encounter (Signed)
Pt is s/p heart cath 3/21.She is tired and weak.   She states she is not able to come in for her 3/28 appt. Can this be turned into a telephone visit or should we r/s?   CT was competed on 3/14.

## 2022-12-27 NOTE — Telephone Encounter (Signed)
Patient states her left foot started swelling in the afternoon yesterday. She denies any chest pain or discomfort, headache, or dizziness. Swelling is only in left foot and patient states it has started to come down since this  morning. Her blood pressure at 12pm was 139/84 hr 76. She has not gained any weight, she states her weight is the same from yesterday. She is not on any fluid medication. Being that the swelling is started to come down I told her to try elevating her foot today and to inform her pcp if her foot swelling is not heart related. Will forward to Dr. Quentin Ore

## 2022-12-28 NOTE — Telephone Encounter (Signed)
Called pt informed her of no worrisome findings on CT per [facility]. Pt gave verbal understanding and states she will call back when she has recovered.

## 2022-12-29 ENCOUNTER — Ambulatory Visit: Payer: Medicare Other | Attending: Cardiology | Admitting: Cardiology

## 2022-12-29 ENCOUNTER — Telehealth: Payer: Self-pay

## 2022-12-29 ENCOUNTER — Telehealth: Payer: Self-pay | Admitting: Cardiology

## 2022-12-29 ENCOUNTER — Encounter: Payer: Self-pay | Admitting: Cardiology

## 2022-12-29 VITALS — BP 122/80 | HR 78 | Ht 66.0 in | Wt 165.0 lb

## 2022-12-29 DIAGNOSIS — I480 Paroxysmal atrial fibrillation: Secondary | ICD-10-CM

## 2022-12-29 DIAGNOSIS — I10 Essential (primary) hypertension: Secondary | ICD-10-CM | POA: Diagnosis not present

## 2022-12-29 DIAGNOSIS — Z950 Presence of cardiac pacemaker: Secondary | ICD-10-CM | POA: Diagnosis not present

## 2022-12-29 DIAGNOSIS — I442 Atrioventricular block, complete: Secondary | ICD-10-CM | POA: Diagnosis not present

## 2022-12-29 NOTE — Telephone Encounter (Signed)
Patient dropped off PAF, gave forms to [facility] to get completed by Dr. Quentin Ore

## 2022-12-29 NOTE — Telephone Encounter (Signed)
I called patient and got a transmission from her. Transmission received. I told her I will let Dr. Quentin Ore know.

## 2022-12-29 NOTE — Telephone Encounter (Signed)
Form has been completed and faxed. Left message for patient letting her know this. I have left the completed form at the [place] office in Dr. Mardene Speak mailbox in case the patient would like to come pick up the completed copy.

## 2022-12-30 ENCOUNTER — Ambulatory Visit: Payer: Medicare Other | Admitting: Urology

## 2022-12-31 LAB — METANEPHRINES, PLASMA
Metanephrine, Free: <25 pg/mL (ref 0.0–88.0)
Normetanephrine, Free: 85.7 pg/mL (ref 0.0–297.2)

## 2023-01-07 NOTE — Telephone Encounter (Signed)
Fax received from [employer] dated 12/30/22 stating:  Your patient, Susan [name], has been approved and is eligible to receive Entresto until 10/04/23 at no cost to your patient.    If medication is patient administered: Instructions have been mailed to the patient. To support the patient, please provide the following instructions: Call [phone] First order: Hold on the line for the next available [employer] representative. Refill and Shipping Status: Instructions are the same as above.

## 2023-01-07 NOTE — Telephone Encounter (Signed)
I attempted to call the patient to confirm she was aware of her approval. No answer- I left a detailed message with the approval notification and asked her to call [phone] to initate her 1st shipment if she has not already done so.  I asked that she call back with any further questions/ concerns.    Will place all completed paperwork and confirmation of approval in the file cabinet for completed assistance applications.

## 2023-01-25 NOTE — Progress Notes (Signed)
Remote pacemaker transmission.   

## 2023-03-15 ENCOUNTER — Ambulatory Visit (INDEPENDENT_AMBULATORY_CARE_PROVIDER_SITE_OTHER): Payer: Medicare Other

## 2023-03-15 DIAGNOSIS — I442 Atrioventricular block, complete: Secondary | ICD-10-CM | POA: Diagnosis not present

## 2023-03-16 ENCOUNTER — Telehealth: Payer: Self-pay | Admitting: Cardiology

## 2023-03-16 LAB — CUP PACEART REMOTE DEVICE CHECK
Battery Remaining Longevity: 116 mo
Battery Remaining Percentage: 93 %
Battery Voltage: 3.01 V
Brady Statistic AP VP Percent: 8.9 %
Brady Statistic AP VS Percent: 1 %
Brady Statistic AS VP Percent: 84 %
Brady Statistic AS VS Percent: 5.7 %
Brady Statistic RA Percent Paced: 8.7 %
Brady Statistic RV Percent Paced: 93 %
Date Time Interrogation Session: 20240611020013
Implantable Lead Connection Status: 753985
Implantable Lead Connection Status: 753985
Implantable Lead Implant Date: 20230612
Implantable Lead Implant Date: 20230612
Implantable Lead Location: 753859
Implantable Lead Location: 753860
Implantable Pulse Generator Implant Date: 20230612
Lead Channel Impedance Value: 550 Ohm
Lead Channel Impedance Value: 590 Ohm
Lead Channel Pacing Threshold Amplitude: 0.75 V
Lead Channel Pacing Threshold Amplitude: 0.875 V
Lead Channel Pacing Threshold Pulse Width: 0.5 ms
Lead Channel Pacing Threshold Pulse Width: 0.5 ms
Lead Channel Sensing Intrinsic Amplitude: 3.2 mV
Lead Channel Sensing Intrinsic Amplitude: 3.3 mV
Lead Channel Setting Pacing Amplitude: 1.125
Lead Channel Setting Pacing Amplitude: 2 V
Lead Channel Setting Pacing Pulse Width: 0.5 ms
Lead Channel Setting Sensing Sensitivity: 0.5 mV
Pulse Gen Model: 2272
Pulse Gen Serial Number: 8087591

## 2023-03-16 MED ORDER — APIXABAN 5 MG PO TABS: 5.0000 mg | ORAL_TABLET | Freq: Two times a day (BID) | ORAL | 6 refills | Status: DC

## 2023-03-16 NOTE — Telephone Encounter (Signed)
*  STAT* If patient is at the pharmacy, call can be transferred to refill team.   1. Which medications need to be refilled? (please list name of each medication and dose if known)   apixaban (ELIQUIS) 5 MG TABS tablet    2. Which pharmacy/location (including street and city if local pharmacy) is medication to be sent to?  [id number] - [place], [place] - [address]      3. Do they need a 30 day or 90 day supply? 30 day

## 2023-03-16 NOTE — Telephone Encounter (Signed)
Pt last saw Dr Lalla Brothers 12/29/22, last labs 12/23/22 Creat 0.47, age 89, weight 74.8kg, based on specified criteria pt is on appropriate dosage of Eliquis 5mg  BID for afib.  Will refill rx.

## 2023-04-12 NOTE — Progress Notes (Signed)
Remote pacemaker transmission.   

## 2023-06-14 ENCOUNTER — Ambulatory Visit (INDEPENDENT_AMBULATORY_CARE_PROVIDER_SITE_OTHER): Payer: Medicare Other

## 2023-06-14 DIAGNOSIS — I442 Atrioventricular block, complete: Secondary | ICD-10-CM | POA: Diagnosis not present

## 2023-06-15 LAB — CUP PACEART REMOTE DEVICE CHECK
Battery Remaining Longevity: 111 mo
Battery Remaining Percentage: 91 %
Battery Voltage: 3.01 V
Brady Statistic AP VP Percent: 9.6 %
Brady Statistic AP VS Percent: 1 %
Brady Statistic AS VP Percent: 86 %
Brady Statistic AS VS Percent: 3.1 %
Brady Statistic RA Percent Paced: 8.7 %
Brady Statistic RV Percent Paced: 96 %
Date Time Interrogation Session: 20240910020023
Implantable Lead Connection Status: 753985
Implantable Lead Connection Status: 753985
Implantable Lead Implant Date: 20230612
Implantable Lead Implant Date: 20230612
Implantable Lead Location: 753859
Implantable Lead Location: 753860
Implantable Pulse Generator Implant Date: 20230612
Lead Channel Impedance Value: 530 Ohm
Lead Channel Impedance Value: 560 Ohm
Lead Channel Pacing Threshold Amplitude: 0.75 V
Lead Channel Pacing Threshold Amplitude: 0.875 V
Lead Channel Pacing Threshold Pulse Width: 0.5 ms
Lead Channel Pacing Threshold Pulse Width: 0.5 ms
Lead Channel Sensing Intrinsic Amplitude: 3.6 mV
Lead Channel Sensing Intrinsic Amplitude: 5 mV
Lead Channel Setting Pacing Amplitude: 1.125
Lead Channel Setting Pacing Amplitude: 2 V
Lead Channel Setting Pacing Pulse Width: 0.5 ms
Lead Channel Setting Sensing Sensitivity: 0.5 mV
Pulse Gen Model: 2272
Pulse Gen Serial Number: 8087591

## 2023-07-01 NOTE — Progress Notes (Signed)
Remote pacemaker transmission.   

## 2023-07-28 ENCOUNTER — Telehealth: Payer: Self-pay | Admitting: Cardiology

## 2023-07-28 NOTE — Telephone Encounter (Signed)
Spoke with patient and she states this is a renew application that she received. Advised that we can most certainly fill out application but will need to wait for tax return information in order to renew. She has appointment on Monday and requested that she please ask for me and I will be able to get the new one signed and can hold onto it until she has those tax returns. She was appreciative with no further questions at this time.

## 2023-07-28 NOTE — Telephone Encounter (Signed)
Pt c/o medication issue:  1. Name of Medication:      sacubitril-valsartan (ENTRESTO) 24-26 MG    2. How are you currently taking this medication (dosage and times per day)? Take 1 tablet by mouth 2 (two) times daily   3. Are you having a reaction (difficulty breathing--STAT)? no  4. What is your medication issue?   Pt needs need script for [employer] patient assistance in order to continue to get refills. Pt would like a c/b to know when she can pick up script.

## 2023-07-31 NOTE — Progress Notes (Deleted)
Cardiology Clinic Note   Date: 07/31/2023 ID: [name], DOB [birth year], MRN [id number]  Primary Cardiologist:  Lorine Bears, MD  Patient Profile    Susan [name] is a 89 y.o. female who presents to the clinic today for ***    Past medical history significant for: Nonobstructive CAD. LHC 12/23/2022 (NSTEMI versus demand ischemia): Mid LAD 45%.  D2 40%.  Otherwise normal coronary arteries.  Moderate LV systolic dysfunction.  EF 35 to 45%.  Mildly elevated LVEDP. Chronic systolic heart failure/Takotsubo cardiomyopathy. Echo 12/24/2022: EF 40 to 45%.  Severe global hypokinesis with basal septal, basal to mid inferior/posterior lateral wall motion suggestive of stress cardiomyopathy.  Severe asymmetric LVH of the basal-septal segment.  No significant LVOT gradient.  Normal RV function.  Normal PA pressure.  Mild MR.  Aortic valve sclerosis/calcification without stenosis. Complete heart block. Dual-chamber PPM implantation 03/15/2022. Remote device check 06/16/2023: Normal device function.  Lead parameters stable. PAF. PSVT. Hypertension. Hyperlipidemia. Lipid panel 12/23/2022: LDL 71, HDL 46, TG 133, total 144. LPa 12/24/2022: 40.6. T2DM. OSA. On CPAP. Macular degeneration.  In summary, patient presented June 2015 with palpitations, dizziness, presyncope and was tachycardic per EMS with HR 170 bpm with underlying RBBB.  She converted to NSR without intervention.  Echo at that time showed normal LV function with no significant valvular abnormalities.  30-day outpatient monitoring showed no evidence of arrhythmia.  She was seen in August 2019 with worsening nocturnal palpitations.  2-week ZIO showed NSR, average HR 65 bpm, 1 run of SVT lasting 20 seconds with max rate 124 bpm, no evidence of A-fib.  She was maintained on Lopressor 25 mg in the morning in the afternoon and 50 mg in the evening.  She has a history of OSA and uses a CPAP.    Patient underwent hospital admission June  2023 for complaints of 2 weeks of bilateral lower extremity weakness, dizziness, and heart rate in the 40s.  Patient admitted to taking more metoprolol than listed in her last visit notes (January 2023 with Dr. Kirke Corin reported metoprolol 50 mg in the morning and evening and 25 mg in the afternoon).  Patient underwent pacemaker implantation during her hospital admission.  Patient underwent hospital admission March 2024 for hypertensive emergency.  Patient reported she developed acute onset of head pressure and when she checked BP SBP was close to 200.  BP per EMS 200s/100s.  Troponin 60>> 580.  Patient reported mid scapular pain with home activities such as vacuuming or chores.  Patient underwent LHC which showed nonobstructive CAD in mid LAD and D2 (details above).  Recent remote device monitoring showed episodes of paroxysmal A-fib.  She was started on Eliquis during her hospital admission.     History of Present Illness    Susan [name] is followed by Dr. Kirke Corin at Dr. Lalla Brothers for the above outlined history.  Patient was last seen in the office by Dr. Lalla Brothers on 12/29/2022 for hospital follow-up.  She was doing well at that time and no changes were made.  Discussed the use of AI scribe software for clinical note transcription with the patient, who gave verbal consent to proceed.  Talk to Elita Quick?  Cleda Daub?  SGLT2?         Nonobstructive CAD LHC March 2024 showed nonobstructive CAD in LAD and D2 otherwise normal coronary arteries.  Patient*** -Continue Plavix, atorvastatin, metoprolol.  Not on aspirin secondary to Eliquis.  Takotsubo cardiomyopathy Echo March 2024 showed EF 40 to 45% and wall  motion abnormalities consistent with stress cardiomyopathy, mild MR.  Patient*** Euvolemic and well compensated on exam. -Continue Lopressor, Entresto. -Start***  Complete heart block S/p permanent pacemaker June 2023.  Remote device check September 2024 showed normal device function with stable lead  parameters.  Patient denies*** -Continue to follow with EP.  PAF Episodes of PAF found on remote device checks.  Patient*** Denies spontaneous bleeding concerns. -Continue metoprolol, Eliquis. Appropriate Eliquis dose.    Hypertension BP today*** -Continue Entresto and Toprol.  Hyperlipidemia LDL March 2024 71, at goal. -Continue atorvastatin.   ROS: All other systems reviewed and are otherwise negative except as noted in History of Present Illness.  Studies Reviewed       ***  Risk Assessment/Calculations    {Does this patient have ATRIAL FIBRILLATION?:[phone]} No BP recorded.  {Refresh Note OR Click here to enter BP  :1}***        Physical Exam    VS:  There were no vitals taken for this visit. , BMI There is no height or weight on file to calculate BMI.  GEN: Well nourished, well developed, in no acute distress. Neck: No JVD or carotid bruits. Cardiac: *** RRR. No murmurs. No rubs or gallops.   Respiratory:  Respirations regular and unlabored. Clear to auscultation without rales, wheezing or rhonchi. GI: Soft, nontender, nondistended. Extremities: Radials/DP/PT 2+ and equal bilaterally. No clubbing or cyanosis. No edema ***  Skin: Warm and dry, no rash. Neuro: Strength intact.  Assessment & Plan   Assessment and Plan               Disposition: ***     {Are you ordering a CV Procedure (e.g. stress test, cath, DCCV, TEE, etc)?   Press F2        :[id number]}   Signed, Etta Grandchild. Quanah Majka, DNP, NP-C

## 2023-08-01 ENCOUNTER — Ambulatory Visit: Payer: Medicare Other | Admitting: Student

## 2023-08-15 ENCOUNTER — Ambulatory Visit: Payer: Medicare Other | Admitting: Student

## 2023-08-17 NOTE — Progress Notes (Signed)
Cardiology Clinic Note   Date: 08/19/2023 ID: Susan [name], DOB 04/23/1932, MRN [id number]  Primary Cardiologist:  Lorine Bears, MD  Patient Profile    Susan [name] is a 89 y.o. female who presents to the clinic today for routine follow up.     Past medical history significant for: Nonobstructive CAD. LHC 12/23/2022 (NSTEMI versus demand ischemia): Mid LAD 45%.  D2 40%.  Otherwise normal coronary arteries.  Moderate LV systolic dysfunction.  EF 35 to 45%.  Mildly elevated LVEDP. Chronic systolic heart failure/Takotsubo cardiomyopathy. Echo 12/24/2022: EF 40 to 45%.  Severe global hypokinesis with basal septal, basal to mid inferior/posterior lateral wall motion suggestive of stress cardiomyopathy.  Severe asymmetric LVH of the basal-septal segment.  No significant LVOT gradient.  Normal RV function.  Normal PA pressure.  Mild MR.  Aortic valve sclerosis/calcification without stenosis. Complete heart block. Dual-chamber PPM implantation 03/15/2022. Remote device check 06/16/2023: Normal device function.  Lead parameters stable. PAF. PSVT. Hypertension. Hyperlipidemia. Lipid panel 12/23/2022: LDL 71, HDL 46, TG 133, total 144. LPa 12/24/2022: 40.6. T2DM. OSA. On CPAP. Macular degeneration.  In summary, patient presented June 2015 with palpitations, dizziness, presyncope and was tachycardic per EMS with HR 170 bpm with underlying RBBB.  She converted to NSR without intervention.  Echo at that time showed normal LV function with no significant valvular abnormalities.  30-day outpatient monitoring showed no evidence of arrhythmia.  She was seen in August 2019 with worsening nocturnal palpitations.  2-week ZIO showed NSR, average HR 65 bpm, 1 run of SVT lasting 20 seconds with max rate 124 bpm, no evidence of A-fib.  She was maintained on Lopressor 25 mg in the morning in the afternoon and 50 mg in the evening.  She has a history of OSA and uses a CPAP.    Patient underwent  hospital admission June 2023 for complaints of 2 weeks of bilateral lower extremity weakness, dizziness, and heart rate in the 40s.  Patient admitted to taking more metoprolol than listed in her last visit notes (January 2023 with Dr. Kirke Corin reported metoprolol 50 mg in the morning and evening and 25 mg in the afternoon).  Patient underwent pacemaker implantation during her hospital admission.  Patient underwent hospital admission March 2024 for hypertensive emergency.  Patient reported she developed acute onset of head pressure and when she checked BP SBP was close to 200.  BP per EMS 200s/100s.  Troponin 60>> 580.  Patient reported mid scapular pain with home activities such as vacuuming or chores.  Patient underwent LHC which showed nonobstructive CAD in mid LAD and D2 (details above).  Recent remote device monitoring showed episodes of paroxysmal A-fib.  She was started on Eliquis during her hospital admission.     History of Present Illness    Susan [name] is followed by Dr. Kirke Corin at [facility] for the above outlined history.   Patient was last seen in the office by Dr. Lalla Brothers on 12/29/2022 for hospital follow-up.  She was doing well at that time and no changes were made.  Discussed the use of AI scribe software for clinical note transcription with the patient, who gave verbal consent to proceed.  The patient is here alone for routine follow-up. She reports no chest pain, shortness of breath, or palpitations. She has adjusted well to the pacemaker, initially experiencing discomfort and fear of sleeping on her side.  She has noticed a significant improvement in her blood pressure since starting Entresto, with home SBP readings  in the 120s after taking medications. She is tolerating her medications well. She denies any bleeding or blood in her urine or stool. She does not do formal exercising but lives independently and performs all light to moderate household activities. She finds she needs to  pace herself in order to get all her tasks done but this has been the case for years and is unchanged.  She questions why she has to be on Eliquis and Plavix. All questions were answered.        ROS: All other systems reviewed and are otherwise negative except as noted in History of Present Illness.  Studies Reviewed    EKG Interpretation Date/Time:  Friday August 19 2023 11:42:44 EST Ventricular Rate:  83 PR Interval:  206 QRS Duration:  126 QT Interval:  388 QTC Calculation: 455 R Axis:   -58  Text Interpretation: Normal sinus rhythm Left axis deviation Left bundle branch block When compared with ECG of 23-Dec-2022 00:11, PREVIOUS ECG IS PRESENT Confirmed by Carlos Levering [phone]) on 08/19/2023 11:48:43 AM   Risk Assessment/Calculations     CHA2DS2-VASc Score = 8   This indicates a 10.8% annual risk of stroke. The patient's score is based upon: CHF History: 0 HTN History: 1 Diabetes History: 1 Stroke History: 2 Vascular Disease History: 1 Age Score: 2 Gender Score: 1             Physical Exam    VS:  BP 134/78 (BP Location: Left Arm, Patient Position: Sitting, Cuff Size: Normal)   Pulse 83   Ht 5\' 6"  (1.676 m)   Wt 168 lb 4 oz (76.3 kg)   SpO2 98%   BMI 27.16 kg/m  , BMI Body mass index is 27.16 kg/m.  GEN: Well nourished, well developed, in no acute distress. Neck: No JVD or carotid bruits. Cardiac:  RRR. No murmurs. No rubs or gallops.   Respiratory:  Respirations regular and unlabored. Clear to auscultation without rales, wheezing or rhonchi. GI: Soft, nontender, nondistended. Extremities: Radials/DP/PT 2+ and equal bilaterally. No clubbing or cyanosis. No edema.  Skin: Warm and dry, no rash. Neuro: Strength intact.  Assessment & Plan      Nonobstructive CAD LHC March 2024 showed nonobstructive CAD in LAD and D2 otherwise normal coronary arteries.  Patient denies chest pain, pressure or tightness. She does not do formal exercise but is  independent with light to moderate household activities.  -Continue Plavix, atorvastatin, metoprolol.  Not on aspirin secondary to Eliquis.  Takotsubo cardiomyopathy Echo March 2024 showed EF 40 to 45% and wall motion abnormalities consistent with stress cardiomyopathy, mild MR.  Patient denies lower extremity edema, shortness of breath, DOE, orthopnea or PND. Euvolemic and well compensated on exam. -Continue Lopressor, Entresto. -BMP today.  Complete heart block S/p permanent pacemaker June 2023.  Remote device check September 2024 showed normal device function with stable lead parameters.  EKG today shows NSR with LBBB.  -Continue to follow with EP.  PAF Episodes of PAF found on remote device checks.  Patient denies palpitations or change in activity tolerance. Denies spontaneous bleeding concerns. -Continue metoprolol, Eliquis. Appropriate Eliquis dose.   -CBC today.   Hypertension BP today 134/78. Patient reports improved BP control since starting Entresto. Home SBP readings typically in the 120s after medication.  -Continue Entresto and Lopressor.  Hyperlipidemia LDL March 2024 71, at goal. -Continue atorvastatin.       Disposition: BMP and CBC today. Return in 3 months or sooner as needed.  Signed, Etta Grandchild. Holly Pring, DNP, NP-C

## 2023-08-19 ENCOUNTER — Encounter: Payer: Self-pay | Admitting: Student

## 2023-08-19 ENCOUNTER — Ambulatory Visit: Payer: Medicare Other | Attending: Student | Admitting: Student

## 2023-08-19 ENCOUNTER — Telehealth: Payer: Self-pay | Admitting: Cardiology

## 2023-08-19 VITALS — BP 134/78 | HR 83 | Ht 66.0 in | Wt 168.2 lb

## 2023-08-19 DIAGNOSIS — I442 Atrioventricular block, complete: Secondary | ICD-10-CM

## 2023-08-19 DIAGNOSIS — I5181 Takotsubo syndrome: Secondary | ICD-10-CM | POA: Diagnosis not present

## 2023-08-19 DIAGNOSIS — I480 Paroxysmal atrial fibrillation: Secondary | ICD-10-CM

## 2023-08-19 DIAGNOSIS — Z79899 Other long term (current) drug therapy: Secondary | ICD-10-CM

## 2023-08-19 DIAGNOSIS — I10 Essential (primary) hypertension: Secondary | ICD-10-CM

## 2023-08-19 DIAGNOSIS — E785 Hyperlipidemia, unspecified: Secondary | ICD-10-CM

## 2023-08-19 NOTE — Telephone Encounter (Signed)
Patient dropped off PAF. Forms placed in Pam's box

## 2023-08-19 NOTE — Telephone Encounter (Signed)
Application completed with all documentation. Application faxed and no further needs.

## 2023-08-19 NOTE — Patient Instructions (Signed)
Medication Instructions:  No changes *If you need a refill on your cardiac medications before your next appointment, please call your pharmacy*   Lab Work: Your provider would like for you to have following labs drawn today CBC and BMET.   If you have labs (blood work) drawn today and your tests are completely normal, you will receive your results only by: MyChart Message (if you have MyChart) OR A paper copy in the mail If you have any lab test that is abnormal or we need to change your treatment, we will call you to review the results.   Testing/Procedures: None ordered   Follow-Up: At [facility], you and your health needs are our priority.  As part of our continuing mission to provide you with exceptional heart care, we have created designated Provider Care Teams.  These Care Teams include your primary Cardiologist (physician) and Advanced Practice Providers (APPs -  Physician Assistants and Nurse Practitioners) who all work together to provide you with the care you need, when you need it.  We recommend signing up for the patient portal called "MyChart".  Sign up information is provided on this After Visit Summary.  MyChart is used to connect with patients for Virtual Visits (Telemedicine).  Patients are able to view lab/test results, encounter notes, upcoming appointments, etc.  Non-urgent messages can be sent to your provider as well.   To learn more about what you can do with MyChart, go to [email or web address].    Your next appointment:   3 month(s)  Provider:   You may see Lorine Bears, MD or one of the following Advanced Practice Providers on your designated Care Team:   Carlos Levering, NP

## 2023-08-20 LAB — BASIC METABOLIC PANEL
BUN/Creatinine Ratio: 16 (ref 12–28)
BUN: 11 mg/dL (ref 8–27)
CO2: 21 mmol/L (ref 20–29)
Calcium: 9.4 mg/dL (ref 8.7–10.3)
Chloride: 105 mmol/L (ref 96–106)
Creatinine, Ser: 0.67 mg/dL (ref 0.57–1.00)
Glucose: 123 mg/dL — ABNORMAL HIGH (ref 70–99)
Potassium: 5.1 mmol/L (ref 3.5–5.2)
Sodium: 141 mmol/L (ref 134–144)
eGFR: 83 mL/min/{1.73_m2} (ref >59)

## 2023-08-20 LAB — CBC
Hematocrit: 39.1 % (ref 34.0–46.6)
Hemoglobin: 12.7 g/dL (ref 11.1–15.9)
MCH: 29.2 pg (ref 26.6–33.0)
MCHC: 32.5 g/dL (ref 31.5–35.7)
MCV: 90 fL (ref 79–97)
Platelets: 252 10*3/uL (ref 150–450)
RBC: 4.35 x10E6/uL (ref 3.77–5.28)
RDW: 12.5 % (ref 11.7–15.4)
WBC: 6.5 10*3/uL (ref 3.4–10.8)

## 2023-08-23 ENCOUNTER — Ambulatory Visit: Payer: Medicare Other | Admitting: Podiatry

## 2023-08-26 ENCOUNTER — Ambulatory Visit: Payer: Medicare Other | Admitting: Podiatry

## 2023-09-13 ENCOUNTER — Ambulatory Visit (INDEPENDENT_AMBULATORY_CARE_PROVIDER_SITE_OTHER): Payer: Medicare Other

## 2023-09-13 DIAGNOSIS — I442 Atrioventricular block, complete: Secondary | ICD-10-CM | POA: Diagnosis not present

## 2023-09-14 LAB — CUP PACEART REMOTE DEVICE CHECK
Battery Remaining Longevity: 110 mo
Battery Remaining Percentage: 88 %
Battery Voltage: 3.01 V
Brady Statistic AP VP Percent: 8.2 %
Brady Statistic AP VS Percent: 1 %
Brady Statistic AS VP Percent: 88 %
Brady Statistic AS VS Percent: 2.2 %
Brady Statistic RA Percent Paced: 7.1 %
Brady Statistic RV Percent Paced: 97 %
Date Time Interrogation Session: 20241210020015
Implantable Lead Connection Status: 753985
Implantable Lead Connection Status: 753985
Implantable Lead Implant Date: 20230612
Implantable Lead Implant Date: 20230612
Implantable Lead Location: 753859
Implantable Lead Location: 753860
Implantable Pulse Generator Implant Date: 20230612
Lead Channel Impedance Value: 490 Ohm
Lead Channel Impedance Value: 540 Ohm
Lead Channel Pacing Threshold Amplitude: 0.75 V
Lead Channel Pacing Threshold Amplitude: 0.75 V
Lead Channel Pacing Threshold Pulse Width: 0.5 ms
Lead Channel Pacing Threshold Pulse Width: 0.5 ms
Lead Channel Sensing Intrinsic Amplitude: 4.8 mV
Lead Channel Sensing Intrinsic Amplitude: 6.7 mV
Lead Channel Setting Pacing Amplitude: 1 V
Lead Channel Setting Pacing Amplitude: 2 V
Lead Channel Setting Pacing Pulse Width: 0.5 ms
Lead Channel Setting Sensing Sensitivity: 0.5 mV
Pulse Gen Model: 2272
Pulse Gen Serial Number: 8087591

## 2023-09-22 ENCOUNTER — Telehealth: Payer: Self-pay | Admitting: Cardiology

## 2023-09-22 NOTE — Telephone Encounter (Signed)
Pt said paperwork work was filled out and sent to [employer]  but now they are saying they don't have it. She is almost out. She would like a call back.

## 2023-09-23 NOTE — Telephone Encounter (Signed)
Spoke with patient and reviewed that application was faxed back on 08/19/23. She states that they do not have it. Advised that I will give them a call to follow up on that. Also discussed additional help through the [email or web address] site along with [employer]. She was interested in reviewing that information so advised that I would mail her the information for her to review. She was appreciative with no further questions at this time.

## 2023-09-23 NOTE — Telephone Encounter (Signed)
Spoke with [employer] and they received application, she has been approved through 10/03/2024. They will send her refill out and she should receive it on Tuesday.   Called and left detailed voicemail message of above information with instructions to call back if she should have any questions. If not no need to return call.

## 2023-10-09 ENCOUNTER — Other Ambulatory Visit: Payer: Self-pay | Admitting: Cardiology

## 2023-10-10 NOTE — Telephone Encounter (Signed)
 Prescription refill request for Eliquis received. Indication:afib Last office visit:11/24 Scr:0.67  11/24 Age: 89 Weight:76.3  kg  Prescription refilled

## 2023-10-12 ENCOUNTER — Telehealth: Payer: Self-pay | Admitting: Cardiology

## 2023-10-12 NOTE — Telephone Encounter (Signed)
 Spoke with the patient and advised that per our PharmD team [facility] medicare has added in a deductible for tier 3 medications. Advised that once she meets her deductible then her cost should go down to $47/month. Advised her to reach out to her insurance company to make sure. Patient verbalized understanding.

## 2023-10-12 NOTE — Telephone Encounter (Signed)
  Patient states she went to pick up her ELIQUIS 5 MG TABS tablet and they told her it would be over $300 and she cannot afford that. What are her options?

## 2023-10-24 NOTE — Addendum Note (Signed)
Addended by: Elease Etienne A on: 10/24/2023 10:37 AM   Modules accepted: Orders

## 2023-10-24 NOTE — Progress Notes (Signed)
Remote pacemaker transmission.   

## 2023-11-24 ENCOUNTER — Ambulatory Visit: Payer: Medicare Other | Admitting: Cardiovascular Disease

## 2023-12-03 NOTE — Progress Notes (Unsigned)
 Cardiology Clinic Note   Date: 12/05/2023 ID: JANEAH KOVACICH, DOB Jul 06, 1932, MRN [id number]  Primary Cardiologist:  Lorine Bears, MD  Chief Complaint   Susan [name] is a 89 y.o. female who presents to the clinic today for routine follow up of chronic cardiac conditions.   Patient Profile   Susan [name] is followed by Dr. Kirke Corin for the history outlined below.      Past medical history significant for: Nonobstructive CAD. LHC 12/23/2022 (NSTEMI versus demand ischemia): Mid LAD 45%.  D2 40%.  Otherwise normal coronary arteries.  Moderate LV systolic dysfunction.  EF 35 to 45%.  Mildly elevated LVEDP. Chronic systolic heart failure/Takotsubo cardiomyopathy. Echo 12/24/2022: EF 40 to 45%.  Severe global hypokinesis with basal septal, basal to mid inferior/posterior lateral wall motion suggestive of stress cardiomyopathy.  Severe asymmetric LVH of the basal-septal segment.  No significant LVOT gradient.  Normal RV function.  Normal PA pressure.  Mild MR.  Aortic valve sclerosis/calcification without stenosis. Complete heart block. Dual-chamber PPM implantation 03/15/2022. Remote device check 09/13/2023: Normal device function.  30 AMS, longest 5 minutes 58 seconds <1% burden.  Lead parameters stable. PAF. PSVT. Hypertension. Hyperlipidemia. Lipid panel 12/23/2022: LDL 71, HDL 46, TG 133, total 144. LPa 12/24/2022: 40.6. T2DM. OSA. On CPAP. Macular degeneration.  In summary, patient presented June 2015 with palpitations, dizziness, presyncope and was tachycardic per EMS with HR 170 bpm with underlying RBBB.  She converted to NSR without intervention.  Echo at that time showed normal LV function with no significant valvular abnormalities.  30-day outpatient monitoring showed no evidence of arrhythmia.  She was seen in August 2019 with worsening nocturnal palpitations.  2-week ZIO showed NSR, average HR 65 bpm, 1 run of SVT lasting 20 seconds with max rate 124 bpm, no evidence  of A-fib.  She was maintained on Lopressor 25 mg in the morning in the afternoon and 50 mg in the evening.  She has a history of OSA and uses a CPAP.    Patient underwent hospital admission June 2023 for complaints of 2 weeks of bilateral lower extremity weakness, dizziness, and heart rate in the 40s.  Patient admitted to taking more metoprolol than listed in her last visit notes (January 2023 with Dr. Kirke Corin reported metoprolol 50 mg in the morning and evening and 25 mg in the afternoon).  Patient underwent pacemaker implantation during her hospital admission.  Patient underwent hospital admission March 2024 for hypertensive emergency.  Patient reported she developed acute onset of head pressure and when she checked BP SBP was close to 200.  BP per EMS 200s/100s.  Troponin 60>> 580.  Patient reported mid scapular pain with home activities such as vacuuming or chores.  Patient underwent LHC which showed nonobstructive CAD in mid LAD and D2 (details above).  Recent remote device monitoring showed episodes of paroxysmal A-fib.  She was started on Eliquis during her hospital admission.   Patient was last seen in the office by me on 08/19/2023 for routine follow-up.  She was doing well at that time and no changes were made.     History of Present Illness    Today, patient is here alone. She reports she is doing very well. Patient denies shortness of breath, dyspnea on exertion, lower extremity edema, orthopnea or PND. No chest pain, pressure, or tightness. She reports an occasional fluttering sensation that is very brief and not particularly bothersome. She has not been doing much exercise secondary to the cold weather. When  it is warm she likes to walk outside. She lives alone and is able to perform light to moderate household activities. She has been helping out her son who had his leg amputated recently.       ROS: All other systems reviewed and are otherwise negative except as noted in History of  Present Illness.  EKGs/Labs Reviewed        12/23/2022: ALT 17; AST 24 08/19/2023: BUN 11; Creatinine, Ser 0.67; Potassium 5.1; Sodium 141   08/19/2023: Hemoglobin 12.7; WBC 6.5   12/23/2022: TSH 6.212    Risk Assessment/Calculations     CHA2DS2-VASc Score = 8   This indicates a 10.8% annual risk of stroke. The patient's score is based upon: CHF History: 0 HTN History: 1 Diabetes History: 1 Stroke History: 2 Vascular Disease History: 1 Age Score: 2 Gender Score: 1             Physical Exam    VS:  BP 132/82 (BP Location: Left Arm, Patient Position: Sitting, Cuff Size: Normal)   Pulse 73   Ht 5\' 4"  (1.626 m)   Wt 166 lb 6.4 oz (75.5 kg)   SpO2 98%   BMI 28.56 kg/m  , BMI Body mass index is 28.56 kg/m.  GEN: Well nourished, well developed, in no acute distress. Neck: No JVD or carotid bruits. Cardiac:  RRR. No murmurs. No rubs or gallops.   Respiratory:  Respirations regular and unlabored. Clear to auscultation without rales, wheezing or rhonchi. GI: Soft, nontender, nondistended. Extremities: Radials/DP/PT 2+ and equal bilaterally. No clubbing or cyanosis. No edema.  Skin: Warm and dry, no rash. Neuro: Strength intact.  Assessment & Plan   Nonobstructive CAD LHC March 2024 showed nonobstructive CAD in LAD and D2 otherwise normal coronary arteries.  Patient denies chest pain, pressure or tightness. She does not do formal exercise but is independent with light to moderate household activities.  -Continue Plavix, atorvastatin, metoprolol.  Not on aspirin secondary to Eliquis.   Takotsubo cardiomyopathy Echo March 2024 showed EF 40 to 45% and wall motion abnormalities consistent with stress cardiomyopathy, mild MR.  Patient denies lower extremity edema, shortness of breath, DOE, orthopnea or PND. Euvolemic and well compensated on exam.  -Continue Lopressor, Entresto. -Update echo. Patient will do this in the next month or so. She has been helping out her son and  would like to get through his upcoming appointments.    Complete heart block S/p permanent pacemaker June 2023.  Remote device check December 2024 showed normal device function with stable lead parameters.  No report of lightheadedness or dizziness.  -Continue to follow with EP.   PAF Episodes of PAF found on remote device checks.  Patient denies change in activity tolerance. She has an occasional fluttering sensation in her chest that is brief and does not bother her.  Denies spontaneous bleeding concerns. RRR on exam today.  -Continue metoprolol, Eliquis. Appropriate Eliquis dose.     Hypertension BP today 142/80 on intake and 132/82 on my recheck. No report of headaches or dizziness. She has macular degeneration. She has stopped driving secondary to poor vision.  -Continue Entresto and Lopressor.   Hyperlipidemia LDL March 2024 71, at goal. -Continue atorvastatin.   Disposition: Echo. Return in 6 months or sooner as needed.          Signed, Etta Grandchild. Theodoros Stjames, DNP, NP-C

## 2023-12-05 ENCOUNTER — Ambulatory Visit: Payer: Medicare Other | Attending: Student | Admitting: Student

## 2023-12-05 ENCOUNTER — Encounter: Payer: Self-pay | Admitting: Student

## 2023-12-05 VITALS — BP 132/82 | HR 73 | Ht 64.0 in | Wt 166.4 lb

## 2023-12-05 DIAGNOSIS — I10 Essential (primary) hypertension: Secondary | ICD-10-CM

## 2023-12-05 DIAGNOSIS — I5181 Takotsubo syndrome: Secondary | ICD-10-CM

## 2023-12-05 DIAGNOSIS — I442 Atrioventricular block, complete: Secondary | ICD-10-CM

## 2023-12-05 DIAGNOSIS — I428 Other cardiomyopathies: Secondary | ICD-10-CM

## 2023-12-05 DIAGNOSIS — I2510 Atherosclerotic heart disease of native coronary artery without angina pectoris: Secondary | ICD-10-CM

## 2023-12-05 DIAGNOSIS — I480 Paroxysmal atrial fibrillation: Secondary | ICD-10-CM

## 2023-12-05 DIAGNOSIS — E785 Hyperlipidemia, unspecified: Secondary | ICD-10-CM

## 2023-12-05 NOTE — Patient Instructions (Signed)
 Medication Instructions:  None ordered at this time  *If you need a refill on your cardiac medications before your next appointment, please call your pharmacy*   Lab Work: None ordered at this time    Testing/Procedures: Your physician has requested that you have an echocardiogram. Echocardiography is a painless test that uses sound waves to create images of your heart. It provides your doctor with information about the size and shape of your heart and how well your heart's chambers and valves are working.   You may receive an ultrasound enhancing agent through an IV if needed to better visualize your heart during the echo. This procedure takes approximately one hour.  There are no restrictions for this procedure.  This will take place at [address] ([facility] [address], [place] [address]   Follow-Up: At [facility], you and your health needs are our priority.  As part of our continuing mission to provide you with exceptional heart care, we have created designated Provider Care Teams.  These Care Teams include your primary Cardiologist (physician) and Advanced Practice Providers (APPs -  Physician Assistants and Nurse Practitioners) who all work together to provide you with the care you need, when you need it.   Your next appointment:   6 month(s)  Provider:   You may see Lorine Bears, MD or Carlos Levering, NP

## 2023-12-11 ENCOUNTER — Other Ambulatory Visit: Payer: Self-pay | Admitting: Cardiology

## 2023-12-13 ENCOUNTER — Ambulatory Visit: Payer: Medicare Other | Attending: Cardiology

## 2023-12-13 DIAGNOSIS — I428 Other cardiomyopathies: Secondary | ICD-10-CM | POA: Diagnosis not present

## 2023-12-14 LAB — CUP PACEART REMOTE DEVICE CHECK
Battery Remaining Longevity: 106 mo
Battery Remaining Percentage: 86 %
Battery Voltage: 3.01 V
Brady Statistic AP VP Percent: 7 %
Brady Statistic AP VS Percent: 1 %
Brady Statistic AS VP Percent: 90 %
Brady Statistic AS VS Percent: 1.9 %
Brady Statistic RA Percent Paced: 5.8 %
Brady Statistic RV Percent Paced: 97 %
Date Time Interrogation Session: 20250311020014
Implantable Lead Connection Status: 753985
Implantable Lead Connection Status: 753985
Implantable Lead Implant Date: 20230612
Implantable Lead Implant Date: 20230612
Implantable Lead Location: 753859
Implantable Lead Location: 753860
Implantable Pulse Generator Implant Date: 20230612
Lead Channel Impedance Value: 550 Ohm
Lead Channel Impedance Value: 560 Ohm
Lead Channel Pacing Threshold Amplitude: 0.75 V
Lead Channel Pacing Threshold Amplitude: 0.875 V
Lead Channel Pacing Threshold Pulse Width: 0.5 ms
Lead Channel Pacing Threshold Pulse Width: 0.5 ms
Lead Channel Sensing Intrinsic Amplitude: 4.6 mV
Lead Channel Sensing Intrinsic Amplitude: 5.4 mV
Lead Channel Setting Pacing Amplitude: 1.125
Lead Channel Setting Pacing Amplitude: 2 V
Lead Channel Setting Pacing Pulse Width: 0.5 ms
Lead Channel Setting Sensing Sensitivity: 0.5 mV
Pulse Gen Model: 2272
Pulse Gen Serial Number: 8087591

## 2024-01-11 ENCOUNTER — Ambulatory Visit: Attending: Student

## 2024-01-11 DIAGNOSIS — I428 Other cardiomyopathies: Secondary | ICD-10-CM | POA: Diagnosis not present

## 2024-01-12 LAB — ECHOCARDIOGRAM COMPLETE
AR max vel: 1.21 cm2
AV Area VTI: 1.2 cm2
AV Area mean vel: 1.31 cm2
AV Mean grad: 8 mmHg
AV Peak grad: 14.9 mmHg
Ao pk vel: 1.93 m/s
Area-P 1/2: 3.12 cm2
S' Lateral: 3.11 cm

## 2024-01-31 NOTE — Addendum Note (Signed)
 Addended by: Lott Rouleau A on: 01/31/2024 11:19 AM   Modules accepted: Orders

## 2024-01-31 NOTE — Progress Notes (Signed)
 Remote pacemaker transmission.

## 2024-03-13 ENCOUNTER — Ambulatory Visit (INDEPENDENT_AMBULATORY_CARE_PROVIDER_SITE_OTHER): Payer: Medicare Other

## 2024-03-13 DIAGNOSIS — I428 Other cardiomyopathies: Secondary | ICD-10-CM | POA: Diagnosis not present

## 2024-03-15 LAB — CUP PACEART REMOTE DEVICE CHECK
Battery Remaining Longevity: 102 mo
Battery Remaining Percentage: 83 %
Battery Voltage: 3.01 V
Brady Statistic AP VP Percent: 7.5 %
Brady Statistic AP VS Percent: 1 %
Brady Statistic AS VP Percent: 90 %
Brady Statistic AS VS Percent: 1.5 %
Brady Statistic RA Percent Paced: 6.3 %
Brady Statistic RV Percent Paced: 97 %
Date Time Interrogation Session: 20250610020015
Implantable Lead Connection Status: 753985
Implantable Lead Connection Status: 753985
Implantable Lead Implant Date: 20230612
Implantable Lead Implant Date: 20230612
Implantable Lead Location: 753859
Implantable Lead Location: 753860
Implantable Pulse Generator Implant Date: 20230612
Lead Channel Impedance Value: 460 Ohm
Lead Channel Impedance Value: 550 Ohm
Lead Channel Pacing Threshold Amplitude: 0.75 V
Lead Channel Pacing Threshold Amplitude: 0.75 V
Lead Channel Pacing Threshold Pulse Width: 0.5 ms
Lead Channel Pacing Threshold Pulse Width: 0.5 ms
Lead Channel Sensing Intrinsic Amplitude: 2.2 mV
Lead Channel Sensing Intrinsic Amplitude: 3.4 mV
Lead Channel Setting Pacing Amplitude: 1 V
Lead Channel Setting Pacing Amplitude: 2 V
Lead Channel Setting Pacing Pulse Width: 0.5 ms
Lead Channel Setting Sensing Sensitivity: 0.5 mV
Pulse Gen Model: 2272
Pulse Gen Serial Number: 8087591

## 2024-03-16 ENCOUNTER — Ambulatory Visit: Payer: Self-pay | Admitting: Cardiology

## 2024-04-26 ENCOUNTER — Ambulatory Visit

## 2024-04-26 VITALS — BP 124/80 | HR 72 | Ht 64.0 in | Wt 158.5 lb

## 2024-04-26 DIAGNOSIS — H3530 Unspecified macular degeneration: Secondary | ICD-10-CM

## 2024-04-26 DIAGNOSIS — E1169 Type 2 diabetes mellitus with other specified complication: Secondary | ICD-10-CM | POA: Diagnosis not present

## 2024-04-26 DIAGNOSIS — I442 Atrioventricular block, complete: Secondary | ICD-10-CM | POA: Diagnosis not present

## 2024-04-26 DIAGNOSIS — E782 Mixed hyperlipidemia: Secondary | ICD-10-CM

## 2024-04-26 DIAGNOSIS — I8393 Asymptomatic varicose veins of bilateral lower extremities: Secondary | ICD-10-CM

## 2024-04-26 DIAGNOSIS — Z7984 Long term (current) use of oral hypoglycemic drugs: Secondary | ICD-10-CM

## 2024-04-26 DIAGNOSIS — I4891 Unspecified atrial fibrillation: Secondary | ICD-10-CM

## 2024-04-26 DIAGNOSIS — Z950 Presence of cardiac pacemaker: Secondary | ICD-10-CM

## 2024-04-26 DIAGNOSIS — I5032 Chronic diastolic (congestive) heart failure: Secondary | ICD-10-CM

## 2024-04-26 DIAGNOSIS — M79605 Pain in left leg: Secondary | ICD-10-CM

## 2024-04-26 DIAGNOSIS — I158 Other secondary hypertension: Secondary | ICD-10-CM

## 2024-04-26 NOTE — Progress Notes (Signed)
 +   New Patient Visit   Physician: Mahogani Holohan A Shenice Dolder, MD  Patient: Susan [name]   DOB: [birth year]   89 y.o. Female  MRN: [id number] Visit Date: 04/26/2024   Chief Complaint  Patient presents with   Establish Care   Subjective  Susan [name] is a 89 y.o. female who presents today as a new patient to establish care.    HPI  Patient with a 2024 history of NSTEMI with concurrent diagnosis of diastolic congestive heart failure.  Her last echo was done in April 2025 showing ejection fraction 45 to 50% with some regional wall abnormalities and moderate LVH.  Patient has been placed on Entresto  24-26 mg daily.  She is feeling well with this medication and blood pressure is well-controlled 124/80 today.  Patient denies any lightheadedness or dizziness.  History of atrial fibrillation.  Patient remains on Eliquis  5 mg daily.  She is on clopidogrel  75 mg as well.  Patient does follow with cardiology on a regular basis.  Patient also remains on metoprolol  50 mg tablets.  Patient with past diagnosis of heart block for which she has a pacemaker.  This has ongoing monitoring from home.  Past diagnosis of type 2 diabetes patient is on metformin 500 mg tablets daily.  No adverse effects with this medication.  She does follow diabetic diet.  She has had more recent weight loss with dietary changes.  BMI currently at 27.2.  She does cook for herself and manages to have a varied diet.  Patient has no episodes of hyper or hypoglycemia.  She does have the beginnings of mild neuropathy in both lower extremities with tingling in her feet at nighttime.  Patient having some swelling and pain in her left lower extremity and ankle.  This has been a new finding for the last several weeks.  She finds that the pain and discomfort wakes her up from sleep.  She does have a history of venous insufficiency.  Patient does well with mobility and tends to be active throughout the day.  She is helping with her son who  more recently had an amputation and so finds that she is much more physically active and has to do errands that she did not have to do previously like grocery shopping.  This can be a bit of a challenge for her but she is managing thus far.  Patient up-to-date for all vaccines.  Patient also has a history of macular degeneration with progressive vision loss at this point.  She has difficulty reading even the large text.  She does see ophthalmology and will have a visit with them in the next weeks     Assessment & Plan   Problem List Items Addressed This Visit       Cardiovascular and Mediastinum   Chronic diastolic CHF (congestive heart failure) (HCC) - Primary (Chronic)   Relevant Orders   CBC with Differential/Platelet   Comprehensive metabolic panel with GFR   Hemoglobin A1c   Lipid panel   Urinalysis, Routine w reflex microscopic   Hypertension   Asymptomatic varicose veins of both lower extremities   Relevant Orders   VAS US  LOWER EXTREMITY VENOUS (DVT)   Heart block AV complete (HCC)   New onset atrial fibrillation (HCC)     Endocrine   DM type 2 with diabetic mixed hyperlipidemia (HCC)     Other   Pacemaker   Macular degeneration   Other Visit Diagnoses       Pain  of left lower extremity       Relevant Orders   VAS US  LOWER EXTREMITY VENOUS (DVT)      #1 chronic diastolic congestive heart failure.  Patient seems to be quite stable on current medications and is euvolemic.  She will have a follow-up appointment with her cardiologist in the next several months.  2.  Hypertension.  Patient to continue with metoprolol  and sacubitril /valsartan .  Blood pressure is generally stable and seems to be well-controlled with his current medication regime.  3.  History of NSTEMI.  Ongoing cardiology management.  Patient doing well on Entresto , metoprolol .   4.  Macular degeneration.  This is a concern in terms of quality of life and is progressing.  Patient will need further  assistance at home and we have discussed possibilities and further assistance through our office as necessary.  5.  Type 2 diabetes.  With Metformin.  Will Recheck Hemoglobin A1c with Labs.  6. Atrial Fibrillation with Pacemaker Placement.  Patient to remain on metoprolol .  It seems that pacemaker is from running normally.     #7.  Left leg pain with swelling.  I will put in for a venous Doppler for further evaluation of venous flow in lower extremities.  Will review with this patient at next appointment  Plan for patient to complete labs.  Generally, her chronic conditions are well-controlled and she has excellent mobility given her age.  We will have her follow-up in 4 weeks.     Objective  BP 124/80 (BP Location: Left Arm, Patient Position: Sitting, Cuff Size: Normal)   Pulse 72   Ht 5' 4 (1.626 m)   Wt 158 lb 8 oz (71.9 kg)   SpO2 99%   BMI 27.21 kg/m      Review of Systems  Constitutional:  Negative for chills, fever and weight loss.  Eyes:  Negative for blurred vision. h Respiratory:  Negative for cough and shortness of breath.   Cardiovascular:  Negative for chest pain and palpitations.  Skin:  Negative for rash.  Psychiatric/Behavioral:  Negative for depression. The patient is not nervous/anxious.      Physical Exam Physical Exam Vitals reviewed.  Constitutional:      Appearance: Normal appearance. Well-developed with normal weight.  HENT:     Head: Normocephalic and atraumatic.  Normal mucous membranes, no oral lesions Eyes:     Pupils: Pupils are equal, round, and reactive to light.  Neck:     Thyroid: No thyroid mass or thyromegaly.  Cardiovascular:     Rate and Rhythm: Normal rate and regular rhythm. Normal heart sounds. Normal peripheral pulses.  Grade IV systoli murmur.  Ext with venous insufficieny/varicosities Pulmonary:     Normal breath sounds with normal effort Abdominal:   Abdomen is soft, without tenderness or noted  hepatosplenomegaly Musculoskeletal:        General: No swelling or edema  Lymphadenopathy:     Cervical: No cervical adenopathy.  Skin:    General: Skin is warm and dry without noticeable rash. Neurological:     General: No focal deficit present.  Psychiatric:        Mood and Affect: Mood, behavior and cognition normal   Past Medical History:  Diagnosis Date   AV block, Mobitz II    a. 03/2022 s/p [employer] Assurity MRI PM 2272 (ser# [id number]) DC PPM w/ L bundle area lead.   Colon polyp    Diastolic dysfunction    a. 03/2022 Echo: EF 60-65%, no rwma,  GrI DD, nl RV fxn, mild-mod MR, mild AS.   Diffuse cystic mastopathy    Hyperlipidemia    Hypertension    Macular degeneration    Nipple discharge 03/22/2013   PAF (paroxysmal atrial fibrillation) (HCC)    a. 12/3022 noted on device interrogation-->CHA2DS2VASc = 8.   Stroke (Ocular)    Tachycardia    Type 2 diabetes mellitus [facility])    Past Surgical History:  Procedure Laterality Date   ABDOMINAL HYSTERECTOMY     APPENDECTOMY     BREAST BIOPSY Bilateral 2006   benign, clip markers in both breasts   COLONOSCOPY  05/03/2015   EYE SURGERY Bilateral 2014   cataract   LEFT HEART CATH AND CORONARY ANGIOGRAPHY N/A 12/23/2022   Procedure: LEFT HEART CATH AND CORONARY ANGIOGRAPHY;  Surgeon: Anner Alm ORN, MD;  Location: [facility] INVASIVE CV LAB;  Service: Cardiovascular;  Laterality: N/A;   PACEMAKER IMPLANT N/A 03/15/2022   Procedure: PACEMAKER IMPLANT;  Surgeon: Cindie Ole DASEN, MD;  Location: [facility] INVASIVE CV LAB;  Service: Cardiovascular;  Laterality: N/A;   salpingo oophorectmy     Family Status  Relation Name Status   Mother  Deceased   Father  Deceased   Mat Aunt  (Not Specified)   Neg Hx  (Not Specified)  No partnership data on file   Family History  Problem Relation Age of Onset   Aortic aneurysm Mother        died from ruptured Ao.   Lung cancer Father    Kidney cancer Maternal Aunt    Breast cancer Neg Hx    Social  History   Socioeconomic History   Marital status: Widowed    Spouse name: Not on file   Number of children: 2   Years of education: Not on file   Highest education level: Not on file  Occupational History   Not on file  Tobacco Use   Smoking status: Never   Smokeless tobacco: Never  Vaping Use   Vaping status: Never Used  Substance and Sexual Activity   Alcohol  use: No   Drug use: No   Sexual activity: Not on file  Other Topics Concern   Not on file  Social History Narrative   Lives by herself    Social Drivers of Health   Financial Resource Strain: Not on file  Food Insecurity: No Food Insecurity (12/23/2022)   Hunger Vital Sign    Worried About Running Out of Food in the Last Year: Never true    Ran Out of Food in the Last Year: Never true  Transportation Needs: No Transportation Needs (12/23/2022)   PRAPARE - Administrator, Civil Service (Medical): No    Lack of Transportation (Non-Medical): No  Physical Activity: Not on file  Stress: Not on file  Social Connections: Not on file   Outpatient Medications Prior to Visit  Medication Sig   acetaminophen  (TYLENOL ) 325 MG tablet Take 1-2 tablets (325-650 mg total) by mouth every 4 (four) hours as needed for mild pain.   atorvastatin  (LIPITOR) 20 MG tablet Take 20 mg by mouth daily.   clopidogrel  (PLAVIX ) 75 MG tablet Take 75 mg by mouth daily.   ELIQUIS  5 MG TABS tablet TAKE 1 TABLET BY MOUTH TWICE A DAY   Glycerin-Hypromellose-PEG 400 (VISINE DRY EYE OP) Apply to eye as needed.   metFORMIN (GLUCOPHAGE) 500 MG tablet Take 2 tablets (1000 mg) by mouth in the morning and one tablet (500 mg) in the evening  metoprolol  tartrate (LOPRESSOR ) 50 MG tablet TAKE 1 TABLET (50 MG TOTAL) BY MOUTH IN THE MORNING, AT NOON, AND AT BEDTIME.   Multiple Vitamins-Minerals (PRESERVISION AREDS 2) CAPS Take 1 capsule by mouth in the morning and at bedtime. Lunch and late afternoon   sacubitril -valsartan  (ENTRESTO ) 24-26 MG Take 1  tablet by mouth 2 (two) times daily.   No facility-administered medications prior to visit.   No Known Allergies   There is no immunization history on file for this patient.  Health Maintenance  Topic Date Due   Medicare Annual Wellness (AWV)  Never done   COVID-19 Vaccine (1) Never done   FOOT EXAM  Never done   OPHTHALMOLOGY EXAM  Never done   DTaP/Tdap/Td (1 - Tdap) Never done   Pneumococcal Vaccine: 50+ Years (1 of 2 - PCV) Never done   Zoster Vaccines- Shingrix (1 of 2) Never done   DEXA SCAN  Never done   HEMOGLOBIN A1C  06/25/2023   INFLUENZA VACCINE  05/04/2024   Hepatitis B Vaccines  Aged Out   HPV VACCINES  Aged Out   Meningococcal B Vaccine  Aged Out    Patient Care Team: Corlis Honor BROCKS, MD as PCP - General (Unknown Physician Specialty) Darron Deatrice LABOR, MD as PCP - Cardiology (Cardiology) Cindie Ole DASEN, MD as PCP - Electrophysiology (Cardiology) Dellie Louanne MATSU, MD (General Surgery) Corlis Honor BROCKS, MD (Internal Medicine)  Depression Screen     No data to display           Parris LABOR [place], MD  [facility] [phone] (phone) [phone] (fax)  [facility]

## 2024-04-27 ENCOUNTER — Encounter (INDEPENDENT_AMBULATORY_CARE_PROVIDER_SITE_OTHER)

## 2024-04-30 ENCOUNTER — Telehealth: Payer: Self-pay

## 2024-04-30 ENCOUNTER — Encounter (INDEPENDENT_AMBULATORY_CARE_PROVIDER_SITE_OTHER)

## 2024-04-30 NOTE — Telephone Encounter (Signed)
 Copied from CRM [id number]. Topic: Appointments - Appointment Cancel/Reschedule >> Apr 30, 2024  8:40 AM Viola F wrote: Patient cancelled US  today due to death in the family, she will call back to reschedule

## 2024-05-03 ENCOUNTER — Other Ambulatory Visit: Payer: Self-pay | Admitting: Cardiology

## 2024-05-03 NOTE — Telephone Encounter (Signed)
 Prescription refill request for Eliquis  received. Indication: Afib  Last office visit: [place], 12/05/2023 Scr:0.67, 08/19/2023 Age: 89 yo  Weight: 71.9 kg   Refill sent.

## 2024-05-09 ENCOUNTER — Ambulatory Visit (INDEPENDENT_AMBULATORY_CARE_PROVIDER_SITE_OTHER)

## 2024-05-09 DIAGNOSIS — I8393 Asymptomatic varicose veins of bilateral lower extremities: Secondary | ICD-10-CM | POA: Diagnosis not present

## 2024-05-09 DIAGNOSIS — M79605 Pain in left leg: Secondary | ICD-10-CM

## 2024-05-11 ENCOUNTER — Ambulatory Visit: Payer: Self-pay

## 2024-05-17 NOTE — Progress Notes (Signed)
 Remote pacemaker transmission.

## 2024-05-17 NOTE — Addendum Note (Signed)
 Addended by: VICCI SELLER A on: 05/17/2024 11:58 AM   Modules accepted: Orders

## 2024-05-24 ENCOUNTER — Ambulatory Visit

## 2024-05-25 ENCOUNTER — Other Ambulatory Visit: Payer: Self-pay

## 2024-05-25 DIAGNOSIS — I5032 Chronic diastolic (congestive) heart failure: Secondary | ICD-10-CM

## 2024-05-28 ENCOUNTER — Other Ambulatory Visit

## 2024-05-29 LAB — COMPREHENSIVE METABOLIC PANEL WITH GFR
AG Ratio: 2.3 (calc) (ref 1.0–2.5)
ALT: 13 U/L (ref 6–29)
AST: 16 U/L (ref 10–35)
Albumin: 4.2 g/dL (ref 3.6–5.1)
Alkaline phosphatase (APISO): 67 U/L (ref 37–153)
BUN: 16 mg/dL (ref 7–25)
CO2: 26 mmol/L (ref 20–32)
Calcium: 9.3 mg/dL (ref 8.6–10.4)
Chloride: 105 mmol/L (ref 98–110)
Creat: 0.6 mg/dL (ref 0.60–0.95)
Globulin: 1.8 g/dL — ABNORMAL LOW (ref 1.9–3.7)
Glucose, Bld: 131 mg/dL — ABNORMAL HIGH (ref 65–99)
Potassium: 4.8 mmol/L (ref 3.5–5.3)
Sodium: 138 mmol/L (ref 135–146)
Total Bilirubin: 0.4 mg/dL (ref 0.2–1.2)
Total Protein: 6 g/dL — ABNORMAL LOW (ref 6.1–8.1)
eGFR: 85 mL/min/1.73m2 (ref >=60)

## 2024-05-29 LAB — HEMOGLOBIN A1C
Hgb A1c MFr Bld: 6.9 % — ABNORMAL HIGH (ref <5.7)
Mean Plasma Glucose: 151 mg/dL
eAG (mmol/L): 8.4 mmol/L

## 2024-05-29 LAB — LIPID PANEL
Cholesterol: 144 mg/dL (ref <200)
HDL: 51 mg/dL (ref >=50)
LDL Cholesterol (Calc): 73 mg/dL
Non-HDL Cholesterol (Calc): 93 mg/dL (ref <130)
Total CHOL/HDL Ratio: 2.8 (calc) (ref <5.0)
Triglycerides: 110 mg/dL (ref <150)

## 2024-05-29 LAB — CBC WITH DIFFERENTIAL/PLATELET
Absolute Lymphocytes: 2604 {cells}/uL (ref 850–3900)
Absolute Monocytes: 574 {cells}/uL (ref 200–950)
Basophils Absolute: 42 {cells}/uL (ref 0–200)
Basophils Relative: 0.6 %
Eosinophils Absolute: 119 {cells}/uL (ref 15–500)
Eosinophils Relative: 1.7 %
HCT: 37.9 % (ref 35.0–45.0)
Hemoglobin: 12 g/dL (ref 11.7–15.5)
MCH: 29.1 pg (ref 27.0–33.0)
MCHC: 31.7 g/dL — ABNORMAL LOW (ref 32.0–36.0)
MCV: 91.8 fL (ref 80.0–100.0)
MPV: 9 fL (ref 7.5–12.5)
Monocytes Relative: 8.2 %
Neutro Abs: 3661 {cells}/uL (ref 1500–7800)
Neutrophils Relative %: 52.3 %
Platelets: 257 Thousand/uL (ref 140–400)
RBC: 4.13 Million/uL (ref 3.80–5.10)
RDW: 12.7 % (ref 11.0–15.0)
Total Lymphocyte: 37.2 %
WBC: 7 Thousand/uL (ref 3.8–10.8)

## 2024-05-29 LAB — URINALYSIS, ROUTINE W REFLEX MICROSCOPIC
Bilirubin Urine: NEGATIVE
Glucose, UA: NEGATIVE
Hgb urine dipstick: NEGATIVE
Ketones, ur: NEGATIVE
Leukocytes,Ua: NEGATIVE
Nitrite: NEGATIVE
Protein, ur: NEGATIVE
Specific Gravity, Urine: 1.012 (ref 1.001–1.035)
pH: <=5 — AB (ref 5.0–8.0)

## 2024-06-04 NOTE — Progress Notes (Deleted)
 Cardiology Clinic Note   Date: 06/04/2024 ID: NIAH HEINLE, DOB 06/15/1932, MRN [id number]  Primary Cardiologist:  None  Chief Complaint   ELIZBETH POSA is a 89 y.o. female who presents to the clinic today for ***  Patient Profile   RUTHMARY OCCHIPINTI is followed by *** for the history outlined below.      Past medical history significant for: Nonobstructive CAD. LHC 12/23/2022 (NSTEMI versus demand ischemia): Mid LAD 45%.  D2 40%.  Otherwise normal coronary arteries.  Moderate LV systolic dysfunction.  EF 35 to 45%.  Mildly elevated LVEDP. Chronic systolic heart failure with improved LV function/Takotsubo cardiomyopathy. Echo 12/24/2022: EF 40 to 45%.  Severe global hypokinesis with basal septal, basal to mid inferior/posterior lateral wall motion suggestive of stress cardiomyopathy.  Severe asymmetric LVH of the basal-septal segment.  No significant LVOT gradient.  Normal RV function.  Normal PA pressure.  Mild MR.  Aortic valve sclerosis/calcification without stenosis.*** Echo 01/11/2024: EF 45 to 50%.  Hypokinesis of mid to distal septal wall, basal to mid inferior wall.  Moderate asymmetric LVH of the basal-septal segment.  Grade I DD.  Normal RV size/function.  Normal PA pressure, RVSP 35.5 mmHg.  Mild MR.  Aortic valve sclerosis/calcification without stenosis.  Mild dilatation of ascending aorta 41 mm. Complete heart block. Dual-chamber PPM implantation 03/15/2022. Remote device check 1 03/13/2024: Normal device function.  A sensed V paced.  Battery and lead parameters stable with stable capture and sensing.  Device programming appropriate.  A-fib burden <1%. PAF. PSVT. Hypertension. Hyperlipidemia. LPa 12/24/2022: 40.6. Lipid panel 05/28/2024: LDL 73, HDL 51, TG 110, total 144. T2DM. OSA. On CPAP. Macular degeneration.  In summary, patient presented June 2015 with palpitations, dizziness, presyncope and was tachycardic per EMS with HR 170 bpm with underlying RBBB.  She  converted to NSR without intervention.  Echo at that time showed normal LV function with no significant valvular abnormalities.  30-day outpatient monitoring showed no evidence of arrhythmia.  She was seen in August 2019 with worsening nocturnal palpitations.  2-week ZIO showed NSR, average HR 65 bpm, 1 run of SVT lasting 20 seconds with max rate 124 bpm, no evidence of A-fib.  She was maintained on Lopressor  25 mg in the morning in the afternoon and 50 mg in the evening.  She has a history of OSA and uses a CPAP.    Patient underwent hospital admission June 2023 for complaints of 2 weeks of bilateral lower extremity weakness, dizziness, and heart rate in the 40s.  Patient admitted to taking more metoprolol  than listed in her last visit notes (January 2023 with Dr. Darron reported metoprolol  50 mg in the morning and evening and 25 mg in the afternoon).  Patient underwent pacemaker implantation during her hospital admission.  Patient underwent hospital admission March 2024 for hypertensive emergency.  Patient reported she developed acute onset of head pressure and when she checked BP SBP was close to 200.  BP per EMS 200s/100s.  Troponin 60>> 580.  Patient reported mid scapular pain with home activities such as vacuuming or chores.  Patient underwent LHC which showed nonobstructive CAD in mid LAD and D2 (details above).  Recent remote device monitoring showed episodes of paroxysmal A-fib.  She was started on Eliquis  during her hospital admission.   Patient was last seen in the office by me on 12/05/2023 for routine follow-up.  She was doing well at that time and helping her son who had recently had his leg amputated.  Echo was updated and showed EF 45 to 50% as detailed above.     History of Present Illness    Today, patient ***  Nonobstructive CAD LHC March 2024 showed nonobstructive CAD in LAD and D2 otherwise normal coronary arteries.  Patient denies chest pain, pressure or tightness. She does not do  formal exercise but is independent with light to moderate household activities.*** -Continue Plavix , atorvastatin , metoprolol .  Not on aspirin  secondary to Eliquis .   Takotsubo cardiomyopathy Echo April 2025 showed EF 45 to 50%, hypokinesis of mid to distal septal wall, basal to mid inferior wall, moderate asymmetric LVH of the basal-septal segment, Grade I DD, normal RV size/function, normal PA pressure, mild MR, aortic sclerosis/calcification without stenosis.  Mild dilatation of ascending aorta 41 mm.  Patient*** Euvolemic and well compensated on exam.  -Continue Lopressor , Entresto .   Complete heart block S/p permanent pacemaker June 2023.  Remote device check June 2025 showed normal device function a sensed V paced, stable battery and lead parameters, appropriate programming, A-fib burden <1%.  No report of lightheadedness or dizziness.  -Continue to follow with EP.   PAF Episodes of PAF found on remote device checks.  Remote device check June 2025 showed A-fib burden <1%.  She has an occasional fluttering sensation in her chest that is brief and does not bother her.  Denies spontaneous bleeding concerns. RRR on exam today.*** -Continue metoprolol , Eliquis . Appropriate Eliquis  dose.     Hypertension BP today*** -Continue Entresto  and Lopressor .   Hyperlipidemia LDL 73 August 2025, at goal. -Continue atorvastatin   ROS: All other systems reviewed and are otherwise negative except as noted in History of Present Illness.  EKGs/Labs Reviewed        05/28/2024: ALT 13; AST 16; BUN 16; Creat 0.60; Potassium 4.8; Sodium 138   05/28/2024: Hemoglobin 12.0; WBC 7.0   No results found for requested labs within last 365 days.   No results found for requested labs within last 365 days.  ***  Risk Assessment/Calculations    {Does this patient have ATRIAL FIBRILLATION?:[phone]} No BP recorded.  {Refresh Note OR Click here to enter BP  :1}***        Physical Exam    VS:  There were  no vitals taken for this visit. , BMI There is no height or weight on file to calculate BMI.  GEN: Well nourished, well developed, in no acute distress. Neck: No JVD or carotid bruits. Cardiac: *** RRR. *** No murmur. No rubs or gallops.   Respiratory:  Respirations regular and unlabored. Clear to auscultation without rales, wheezing or rhonchi. GI: Soft, nontender, nondistended. Extremities: Radials/DP/PT 2+ and equal bilaterally. No clubbing or cyanosis. No edema ***  Skin: Warm and dry, no rash. Neuro: Strength intact.  Assessment & Plan   ***  Disposition: ***     {Are you ordering a CV Procedure (e.g. stress test, cath, DCCV, TEE, etc)?   Press F2        :[id number]}   Signed, Barnie HERO. Charmelle Soh, DNP, NP-C

## 2024-06-05 NOTE — Progress Notes (Unsigned)
 Electrophysiology Clinic Note    Date:  06/06/2024  Patient ID:  Susan, [name] 31-Aug-1932, MRN [id number] PCP:  No primary care provider on file.  Cardiologist:  Deatrice Cage, MD   Cardiology APP:  Loistine Sober, NP  Electrophysiologist:  OLE ONEIDA HOLTS, MD  Electrophysiology APP:  Sharona Rovner, NP     Discussed the use of AI scribe software for clinical note transcription with the patient, who gave verbal consent to proceed.   Patient Profile    Chief Complaint: PPM follow-up  History of Present Illness: Susan [name] is a 89 y.o. female with PMH notable for CHB s/p PPM, parox AFib, non-obs CAD, HFmrEF, takotsubo cardiomyopathy, HTN, T2DM, ; seen today for OLE ONEIDA HOLTS, MD for routine electrophysiology followup.   She last saw Dr. HOLTS 12/2022 after a recent hosptialization for chest pain, thought d/t HTN urgency. She was started on eliquis  during that hospitalization for salvos of AFib, tolerating eliquis  well.   She saw NP [place] 08/2023 where she was doing well.   On follow-up today, she has no complaints related to her PPM. She does not notice that it is there any longer. She denies chest pain, chest pressure, palpitations. No dizziness, presyncope. She continues to take eliquis  BID without bleeding concerns.      Arrhythmia/Device History St. Jude dual chamber PPM, imp 2023; dx CHB    ROS:  Please see the history of present illness. All other systems are reviewed and otherwise negative.    Physical Exam    VS:  BP 126/78   Pulse 69   Ht 5' 7 (1.702 m)   Wt 160 lb 9.6 oz (72.8 kg)   SpO2 99%   BMI 25.15 kg/m  BMI: Body mass index is 25.15 kg/m.      Wt Readings from Last 3 Encounters:  06/06/24 160 lb 9.6 oz (72.8 kg)  04/26/24 158 lb 8 oz (71.9 kg)  12/05/23 166 lb 6.4 oz (75.5 kg)     GEN- The patient is well appearing, alert and oriented x 3 today.   Lungs- Clear to ausculation bilaterally, normal work of  breathing.  Heart- Regular rate and rhythm, no murmurs, rubs or gallops Extremities- No peripheral edema, warm, dry Skin-  device pocket well-healed, no tethering   Device interrogation done today and reviewed by myself:  Battery 8 years Lead thresholds, impedence, sensing stable  Dependent to VVI 40bpm Rare, brief AF episodes Rare PMT episodes, unable to program around retrograde No changes made today  Studies Reviewed   Previous EP, cardiology notes.    EKG is ordered. Personal review of EKG from today shows:    EKG Interpretation Date/Time:  Wednesday June 06 2024 14:20:37 EDT Ventricular Rate:  69 PR Interval:  228 QRS Duration:  114 QT Interval:  392 QTC Calculation: 420 R Axis:   -68  Text Interpretation: Sinus rhythm with sinus arrhythmia with 1st degree A-V block Left axis deviation Low voltage QRS Confirmed by Jamice Carreno [phone]) on 06/06/2024 2:25:32 PM     TTE, 01/11/2024  1. Left ventricular ejection fraction, by estimation, is 45 to 50%. Left ventricular ejection fraction by 3D volume is 45 %. The left ventricle has normal function. The left ventricle demonstrates regional wall motion abnormalities (hypokinesis of the mid to distal septal wall, basal to mid inferior wall). There is moderate asymmetric left ventricular hypertrophy of the basal-septal segment. Left ventricular diastolic parameters are consistent with Grade I diastolic dysfunction (impaired  relaxation). The average left ventricular global longitudinal strain is -15.5 %. The global longitudinal strain is abnormal.   2. Right ventricular systolic function is normal. The right ventricular size is normal. There is normal pulmonary artery systolic pressure. The estimated right ventricular systolic pressure is 35.5 mmHg.   3. The mitral valve is normal in structure. Mild mitral valve regurgitation. No evidence of mitral stenosis.   4. The aortic valve is tricuspid. There is mild calcification of the aortic  valve. Aortic valve regurgitation is not visualized. Aortic valve sclerosis/calcification is present, without any evidence of aortic stenosis.   5. There is mild dilatation of the ascending aorta, measuring 41 mm.   6. The inferior vena cava is normal in size with greater than 50% respiratory variability, suggesting right atrial pressure of 3 mmHg.   TTE, 12/24/2022  1. Left ventricular ejection fraction, by estimation, is 40 to 45%. The left ventricle has mildly decreased function. The left ventricle demonstrates severe global hypokinesis, with basal septal, basal to mi inferio/posterior adn lateral wall motion best preserved, suggestive of stress cardiomyopathy. There is severe asymmetric left ventricular hypertrophy of the basal-septal segment. No significant LVOT gradient noted.   2. Right ventricular systolic function is normal. The right ventricular size is normal. There is normal pulmonary artery systolic pressure. The  estimated right ventricular systolic pressure is 33.0 mmHg.   3. The mitral valve is normal in structure. Mild mitral valve regurgitation. No evidence of mitral stenosis.   4. The aortic valve is normal in structure. Aortic valve regurgitation is not visualized. Aortic valve sclerosis/calcification is present, without any evidence of aortic stenosis.   5. The inferior vena cava is normal in size with greater than 50% respiratory variability, suggesting right atrial pressure of 3 mmHg.   LHC, 12/23/2022   Mid LAD lesion is 45% stenosed with 40% stenosed side branch in 2nd Diag.  Otherwise normal coronary arteries   There is moderate left ventricular systolic dysfunction.   LV end diastolic pressure is mildly elevated.   The left ventricular ejection fraction is 35-45% by visual estimate.   POST-CATH DIAGNOSES LV gram with moderate reduced EF of 40 to 45% with wall motion abnormality suggestive of Takotsubo cardiomyopathY Angiographically minimal CAD with a tapering moderate to  small caliber LAD with a 50% stenosis at 2nd Diag and then tapers at that same caliber throughout the rest the vessel and does not reach the apex. Large dominant RCA with large PDA making up for the small LAD LCx system is made above Ramus Intermedius the course of the 1st Mrg an the Cx courses as a 2nd Mrg   Assessment and Plan     #) CHB s/p PPM Device functioning well, see paceart for details Dependent today to VVI 40 VP 97%  #) parox AFib Very low burden via device No symptoms during episodes  #) Hypercoag d/t afib CHA2DS2-VASc Score = at least 9 [CHF History: 1, HTN History: 1, Diabetes History: 1, Stroke History: 2, Vascular Disease History: 1, Age Score: 2, Gender Score: 1].  Therefore, the patient's annual risk of stroke is 12.2 %.    Stroke ppx - 5mg  eliquis  BID, appropriately dosed No bleeding concerns  #) HFmrEF Appears warm and euvolemic on exam BP well controlled Continue 24-26 entresto , 50mg  lopressor  TID       Current medicines are reviewed at length with the patient today.   The patient does not have concerns regarding her medicines.  The following changes were made  today:  none  Labs/ tests ordered today include:  Orders Placed This Encounter  Procedures   EKG 12-Lead     Disposition: Follow up with Dr. Cindie or EP APP in 2 years, continue remote monitoring   Follow-up with gen cards in ~6 months    Signed, Jayley Hustead, NP  06/06/24  4:00 PM  Electrophysiology [facility]

## 2024-06-06 ENCOUNTER — Encounter: Payer: Self-pay | Admitting: Cardiology

## 2024-06-06 ENCOUNTER — Ambulatory Visit: Attending: Cardiology | Admitting: Cardiology

## 2024-06-06 ENCOUNTER — Ambulatory Visit: Admitting: Student

## 2024-06-06 VITALS — BP 126/78 | HR 69 | Ht 67.0 in | Wt 160.6 lb

## 2024-06-06 DIAGNOSIS — I480 Paroxysmal atrial fibrillation: Secondary | ICD-10-CM | POA: Diagnosis not present

## 2024-06-06 DIAGNOSIS — D6869 Other thrombophilia: Secondary | ICD-10-CM

## 2024-06-06 DIAGNOSIS — I5032 Chronic diastolic (congestive) heart failure: Secondary | ICD-10-CM

## 2024-06-06 DIAGNOSIS — I442 Atrioventricular block, complete: Secondary | ICD-10-CM | POA: Diagnosis not present

## 2024-06-06 DIAGNOSIS — Z950 Presence of cardiac pacemaker: Secondary | ICD-10-CM | POA: Diagnosis not present

## 2024-06-06 LAB — CUP PACEART INCLINIC DEVICE CHECK
Battery Remaining Longevity: 102 mo
Battery Voltage: 3.01 V
Brady Statistic RA Percent Paced: 7.4 %
Brady Statistic RV Percent Paced: 97 %
Date Time Interrogation Session: 20250903160104
Implantable Lead Connection Status: 753985
Implantable Lead Connection Status: 753985
Implantable Lead Implant Date: 20230612
Implantable Lead Implant Date: 20230612
Implantable Lead Location: 753859
Implantable Lead Location: 753860
Implantable Pulse Generator Implant Date: 20230612
Lead Channel Impedance Value: 450 Ohm
Lead Channel Impedance Value: 525 Ohm
Lead Channel Pacing Threshold Amplitude: 0.75 V
Lead Channel Pacing Threshold Amplitude: 0.75 V
Lead Channel Pacing Threshold Amplitude: 0.75 V
Lead Channel Pacing Threshold Amplitude: 0.75 V
Lead Channel Pacing Threshold Pulse Width: 0.5 ms
Lead Channel Pacing Threshold Pulse Width: 0.5 ms
Lead Channel Pacing Threshold Pulse Width: 0.5 ms
Lead Channel Pacing Threshold Pulse Width: 0.5 ms
Lead Channel Sensing Intrinsic Amplitude: 2.3 mV
Lead Channel Sensing Intrinsic Amplitude: 3.9 mV
Lead Channel Setting Pacing Amplitude: 0.875
Lead Channel Setting Pacing Amplitude: 2 V
Lead Channel Setting Pacing Pulse Width: 0.5 ms
Lead Channel Setting Sensing Sensitivity: 0.5 mV
Pulse Gen Model: 2272
Pulse Gen Serial Number: 8087591

## 2024-06-06 NOTE — Patient Instructions (Signed)
 Medication Instructions:  Your physician recommends that you continue on your current medications as directed. Please refer to the Current Medication list given to you today.   *If you need a refill on your cardiac medications before your next appointment, please call your pharmacy*  Lab Work: No labs ordered today  If you have labs (blood work) drawn today and your tests are completely normal, you will receive your results only by: MyChart Message (if you have MyChart) OR A paper copy in the mail If you have any lab test that is abnormal or we need to change your treatment, we will call you to review the results.  Testing/Procedures: No test ordered today   Follow-Up: At [facility], you and your health needs are our priority.  As part of our continuing mission to provide you with exceptional heart care, our providers are all part of one team.  This team includes your primary Cardiologist (physician) and Advanced Practice Providers or APPs (Physician Assistants and Nurse Practitioners) who all work together to provide you with the care you need, when you need it.  Your next appointment:   2 year(s)  Provider:   Suzann Riddle, NP    We recommend signing up for the patient portal called MyChart.  Sign up information is provided on this After Visit Summary.  MyChart is used to connect with patients for Virtual Visits (Telemedicine).  Patients are able to view lab/test results, encounter notes, upcoming appointments, etc.  Non-urgent messages can be sent to your provider as well.   To learn more about what you can do with MyChart, go to [email or web address].   Other Instructions Follow up 6 months with General Cardiology

## 2024-06-09 ENCOUNTER — Ambulatory Visit: Payer: Self-pay | Admitting: Cardiology

## 2024-06-11 ENCOUNTER — Ambulatory Visit

## 2024-06-11 VITALS — BP 128/72 | HR 65 | Ht 67.0 in | Wt 156.6 lb

## 2024-06-11 DIAGNOSIS — M79672 Pain in left foot: Secondary | ICD-10-CM | POA: Insufficient documentation

## 2024-06-11 DIAGNOSIS — I5032 Chronic diastolic (congestive) heart failure: Secondary | ICD-10-CM | POA: Diagnosis not present

## 2024-06-11 DIAGNOSIS — I158 Other secondary hypertension: Secondary | ICD-10-CM

## 2024-06-11 DIAGNOSIS — E782 Mixed hyperlipidemia: Secondary | ICD-10-CM

## 2024-06-11 DIAGNOSIS — E1169 Type 2 diabetes mellitus with other specified complication: Secondary | ICD-10-CM

## 2024-06-11 DIAGNOSIS — Z7984 Long term (current) use of oral hypoglycemic drugs: Secondary | ICD-10-CM

## 2024-06-11 DIAGNOSIS — I214 Non-ST elevation (NSTEMI) myocardial infarction: Secondary | ICD-10-CM

## 2024-06-11 DIAGNOSIS — I8393 Asymptomatic varicose veins of bilateral lower extremities: Secondary | ICD-10-CM

## 2024-06-11 NOTE — Progress Notes (Signed)
 Progress Note  Physician: Ewing Fandino A Anjolina Byrer, MD   HPI: Susan [name] is a 89 y.o. female presenting on 06/11/2024 for Medical Management of Chronic Issues (Discuss recent results ) .  Discussed the use of AI scribe software for clinical note transcription with the patient, who gave verbal consent to proceed.  History of Present Illness   Susan [name] is a 89 year old female with venous insufficiency who presents with acute pain in the big toe.  Acute pain r great toe - Severe pain in the right great toe occurred at 4:00 AM yesterday - Pain described as the worst ever experienced in that area - Pain was transient, lasting only a couple of minutes - No recurrence of severe pain today - Occasional minor jabs of pain in the toes - No pain in the knee or contralateral foot - Pain does not impair ambulation  Lower extremity venous insufficiency symptoms - History of venous insufficiency with prior ultrasound showing abnormal blood flow in a vein, no evidence of thrombosis - Sensation of fullness in the foot at the site where support hose is worn - No visible swelling of the foot - Frequent leg elevation to relieve discomfort  Weight loss and dietary changes - Intentional weight loss of thirty pounds - Dietary modifications include increased vegetable intake and reduced red meat consumption.  Is active, does exerciseDM -   Relevant medical history and recent laboratory evaluation - History of pacemaker placement - Recent laboratory studies including kidney function, liver function, cholesterol, and hemoglobin A1c within normal limits  Patient seen in follow-up.  She is status post NSTEMI.  Recently seen by cardiology and has not had a pacemaker check.  Currently stable.  Feeling good at home no shortness of breath lightheadedness or dizziness       DM II - Stable, HgbA1C 6.9.  No episodes of hypoglycemia.   Remains on metofrmin 500 mg  Medical  history:  Relevant past medical, surgical, family and social history reviewed and updated as indicated. Interim medical history since our last visit reviewed.  Allergies and medications reviewed and updated.   ROS: Negative unless specifically indicated above in HPI.    Current Outpatient Medications:    acetaminophen  (TYLENOL ) 325 MG tablet, Take 1-2 tablets (325-650 mg total) by mouth every 4 (four) hours as needed for mild pain., Disp: , Rfl:    atorvastatin  (LIPITOR) 20 MG tablet, Take 20 mg by mouth daily., Disp: , Rfl:    clopidogrel  (PLAVIX ) 75 MG tablet, Take 75 mg by mouth daily., Disp: , Rfl:    ELIQUIS  5 MG TABS tablet, TAKE 1 TABLET BY MOUTH TWICE A DAY, Disp: 60 tablet, Rfl: 6   Glycerin-Hypromellose-PEG 400 (VISINE DRY EYE OP), Apply to eye as needed., Disp: , Rfl:    metFORMIN (GLUCOPHAGE) 500 MG tablet, Take 2 tablets (1000 mg) by mouth in the morning and one tablet (500 mg) in the evening, Disp: , Rfl:    metoprolol  tartrate (LOPRESSOR ) 50 MG tablet, TAKE 1 TABLET (50 MG TOTAL) BY MOUTH IN THE MORNING, AT NOON, AND AT BEDTIME., Disp: 270 tablet, Rfl: 3   Multiple Vitamins-Minerals (PRESERVISION AREDS 2) CAPS, Take 1 capsule by mouth in the morning and at bedtime. Lunch and late afternoon, Disp: , Rfl:    sacubitril -valsartan  (ENTRESTO ) 24-26 MG, Take 1 tablet by mouth 2 (two) times daily., Disp: 60 tablet, Rfl: 1  Objective:     BP 128/72 (BP Location: Left Arm, Patient Position: Sitting, Cuff Size: Normal)   Pulse 65   Ht 5' 7 (1.702 m)   Wt 156 lb 9.6 oz (71 kg)   SpO2 98%   BMI 24.53 kg/m   Wt Readings from Last 3 Encounters:  06/11/24 156 lb 9.6 oz (71 kg)  06/06/24 160 lb 9.6 oz (72.8 kg)  04/26/24 158 lb 8 oz (71.9 kg)    Physical Exam  Physical Exam Vitals reviewed.  Constitutional:      Appearance: Normal appearance. Well-developed with normal weight.  Cardiovascular:     Rate and Rhythm: Normal rate and regular rhythm. Normal heart  sounds. Normal peripheral pulses Pulmonary:     Normal breath sounds with normal effort Skin:    General: Skin is warm and dry without noticeable rash. Neurological:     General: No focal deficit present.  Psychiatric:        Mood and Affect: Mood, behavior and cognition normal      Assessment & Plan:   Encounter Diagnoses  Name Primary?   Asymptomatic varicose veins of both lower extremities Yes   Chronic diastolic CHF (congestive heart failure) (HCC)    DM type 2 with diabetic mixed hyperlipidemia (HCC)    Acute foot pain, left    Other secondary hypertension    Non-ST elevation (NSTEMI) myocardial infarction [facility])     Orders Placed This Encounter  Procedures   Hemoglobin A1c   Comprehensive metabolic panel with GFR     Assessment and Plan    Pain in left foot and toes Not clear Differential includes nerve compression possibly originating from the back, as venous insufficiency is unlikely to cause such pain. No evidence of clots or arterial issues on recent ultrasound. No injuries reported. Concern due to family history of blood clots. - Fu if recurrence or other concerns  Venous insufficiency of bilateral lower extremities Chronic venous insufficiency with symptoms of swelling and discomfort in the legs. No clots or arterial issues found on recent ultrasound. Surgical intervention discussed but not recommended due to age-related healing concerns and current symptom management with leg elevation. - Continue leg elevation, compression stockings to manage symptoms  Type 2 diabetes mellitus without complications Type 2 diabetes well-controlled with a hemoglobin A1c of 6.9%. No complications reported. Weight loss noted but intentional. Diet includes vegetables and limited red meat. - Continue current diabetes management - Schedule follow-up for blood sugar check in November - Perform lab work one week prior to next appointment     NSTEMI - Patient stable, continue with  current medications.  BP 128/72.  Ongoing cardiology follow up

## 2024-06-12 ENCOUNTER — Other Ambulatory Visit: Payer: Self-pay

## 2024-06-12 ENCOUNTER — Ambulatory Visit: Payer: Medicare Other

## 2024-06-12 DIAGNOSIS — I428 Other cardiomyopathies: Secondary | ICD-10-CM | POA: Diagnosis not present

## 2024-06-12 NOTE — Telephone Encounter (Unsigned)
 Copied from CRM [id number]. Topic: Clinical - Medication Refill >> Jun 12, 2024  9:27 AM Myrick T wrote: Patient took her last pill on yesterday and the original prescriber was Dr Corlis.   Medication: clopidogrel  (PLAVIX ) 75 MG tablet  Has the patient contacted their pharmacy? Yes  This is the patient's preferred pharmacy:  [id number] - [place], [place] - [address] [place] [address] Phone: [phone] Fax: [phone]  Is this the correct pharmacy for this prescription? Yes  Has the prescription been filled recently? Yes  Is the patient out of the medication? Yes  Has the patient been seen for an appointment in the last year OR does the patient have an upcoming appointment? Yes  Can we respond through MyChart? No  Agent: Please be advised that Rx refills may take up to 3 business days. We ask that you follow-up with your pharmacy.

## 2024-06-13 ENCOUNTER — Other Ambulatory Visit: Payer: Self-pay

## 2024-06-13 LAB — CUP PACEART REMOTE DEVICE CHECK
Battery Remaining Longevity: 99 mo
Battery Remaining Percentage: 81 %
Battery Voltage: 3.01 V
Brady Statistic AP VP Percent: 16 %
Brady Statistic AP VS Percent: 1 %
Brady Statistic AS VP Percent: 80 %
Brady Statistic AS VS Percent: 1 %
Brady Statistic RA Percent Paced: 12 %
Brady Statistic RV Percent Paced: 96 %
Date Time Interrogation Session: 20250909020012
Implantable Lead Connection Status: 753985
Implantable Lead Connection Status: 753985
Implantable Lead Implant Date: 20230612
Implantable Lead Implant Date: 20230612
Implantable Lead Location: 753859
Implantable Lead Location: 753860
Implantable Pulse Generator Implant Date: 20230612
Lead Channel Impedance Value: 480 Ohm
Lead Channel Impedance Value: 550 Ohm
Lead Channel Pacing Threshold Amplitude: 0.625 V
Lead Channel Pacing Threshold Amplitude: 0.75 V
Lead Channel Pacing Threshold Pulse Width: 0.5 ms
Lead Channel Pacing Threshold Pulse Width: 0.5 ms
Lead Channel Sensing Intrinsic Amplitude: 5 mV
Lead Channel Sensing Intrinsic Amplitude: 5.9 mV
Lead Channel Setting Pacing Amplitude: 0.875
Lead Channel Setting Pacing Amplitude: 2 V
Lead Channel Setting Pacing Pulse Width: 0.5 ms
Lead Channel Setting Sensing Sensitivity: 0.5 mV
Pulse Gen Model: 2272
Pulse Gen Serial Number: 8087591

## 2024-06-13 MED ORDER — CLOPIDOGREL BISULFATE 75 MG PO TABS: 75.0000 mg | ORAL_TABLET | Freq: Every day | ORAL | 3 refills | Status: AC

## 2024-06-13 NOTE — Telephone Encounter (Signed)
Patient is calling to check on refill status.

## 2024-06-13 NOTE — Telephone Encounter (Signed)
 Requested medication (s) are due for refill today: routing for review es Requested medication (s) are on the active medication list: no  Last refill:  12/23/22  Future visit scheduled: no  Notes to clinic:  Unable to refill per protocol, last refill by another provider. Historical medication, LRF 12/23/22     Requested Prescriptions  Pending Prescriptions Disp Refills   clopidogrel  (PLAVIX ) 75 MG tablet      Sig: Take 1 tablet (75 mg total) by mouth daily.     Hematology: Antiplatelets - clopidogrel  Passed - 06/13/2024 11:58 AM      Passed - HCT in normal range and within 180 days    HCT  Date Value Ref Range Status  05/28/2024 37.9 35.0 - 45.0 % Final   Hematocrit  Date Value Ref Range Status  08/19/2023 39.1 34.0 - 46.6 % Final         Passed - HGB in normal range and within 180 days    Hemoglobin  Date Value Ref Range Status  05/28/2024 12.0 11.7 - 15.5 g/dL Final  88/84/7975 87.2 11.1 - 15.9 g/dL Final         Passed - PLT in normal range and within 180 days    Platelets  Date Value Ref Range Status  05/28/2024 257 140 - 400 Thousand/uL Final  08/19/2023 252 150 - 450 x10E3/uL Final         Passed - Cr in normal range and within 360 days    Creat  Date Value Ref Range Status  05/28/2024 0.60 0.60 - 0.95 mg/dL Final         Passed - Valid encounter within last 6 months    Recent Outpatient Visits           2 days ago Asymptomatic varicose veins of both lower extremities   [place] [facility] [place], [place] A, MD   1 month ago Chronic diastolic CHF (congestive heart failure) [facility])   [place] [facility] Everlene Parris LABOR, MD

## 2024-06-13 NOTE — Telephone Encounter (Signed)
Patient notified prescription sent in.

## 2024-06-17 ENCOUNTER — Ambulatory Visit: Payer: Self-pay | Admitting: Cardiology

## 2024-06-22 NOTE — Progress Notes (Signed)
 Remote PPM Transmission

## 2024-07-05 ENCOUNTER — Ambulatory Visit (INDEPENDENT_AMBULATORY_CARE_PROVIDER_SITE_OTHER)

## 2024-07-05 DIAGNOSIS — Z23 Encounter for immunization: Secondary | ICD-10-CM | POA: Diagnosis not present

## 2024-07-19 ENCOUNTER — Other Ambulatory Visit: Payer: Self-pay

## 2024-07-19 NOTE — Telephone Encounter (Signed)
 Copied from CRM #[id number]. Topic: Clinical - Medication Refill >> Jul 19, 2024  2:35 PM Susan [name] wrote: Medication: sacubitril -valsartan  (ENTRESTO ) 24-26 MG- need generic   Has the patient contacted their pharmacy? No (Agent: If no, request that the patient contact the pharmacy for the refill. If patient does not wish to contact the pharmacy document the reason why and proceed with request.) (Agent: If yes, when and what did the pharmacy advise?)  This is the patient's preferred pharmacy:  [id number] - [place], [place] - [address] [place] [address] Phone: [phone] Fax: [phone]  Is this the correct pharmacy for this prescription? Yes If no, delete pharmacy and type the correct one.   Has the prescription been filled recently? Yes  Is the patient out of the medication? No  Has the patient been seen for an appointment in the last year OR does the patient have an upcoming appointment? Yes  Can we respond through MyChart? No  Agent: Please be advised that Rx refills may take up to 3 business days. We ask that you follow-up with your pharmacy.

## 2024-07-20 NOTE — Telephone Encounter (Signed)
 Requested medication (s) are due for refill today: yes  Requested medication (s) are on the active medication list: yes  Last refill:  12/24/22  Future visit scheduled: yes  Notes to clinic:  Unable to refill per protocol, last refill by another provider.      Requested Prescriptions  Pending Prescriptions Disp Refills   sacubitril -valsartan  (ENTRESTO ) 24-26 MG 60 tablet 1    Sig: Take 1 tablet by mouth 2 (two) times daily.     Off-Protocol Failed - 07/20/2024  4:18 PM      Failed - Medication not assigned to a protocol, review manually.      Passed - Valid encounter within last 12 months    Recent Outpatient Visits           1 month ago Asymptomatic varicose veins of both lower extremities   [place] [facility] [place],  A, MD   2 months ago Chronic diastolic CHF (congestive heart failure) [facility])   [place] [facility] Everlene Parris LABOR, MD

## 2024-07-23 MED ORDER — SACUBITRIL-VALSARTAN 24-26 MG PO TABS: 1.0000 | ORAL_TABLET | Freq: Two times a day (BID) | ORAL | 1 refills | Status: DC

## 2024-08-10 ENCOUNTER — Other Ambulatory Visit

## 2024-08-10 DIAGNOSIS — I5032 Chronic diastolic (congestive) heart failure: Secondary | ICD-10-CM

## 2024-08-10 DIAGNOSIS — E1169 Type 2 diabetes mellitus with other specified complication: Secondary | ICD-10-CM

## 2024-08-11 LAB — COMPREHENSIVE METABOLIC PANEL WITH GFR
AG Ratio: 2.1 (calc) (ref 1.0–2.5)
ALT: 10 U/L (ref 6–29)
AST: 15 U/L (ref 10–35)
Albumin: 4 g/dL (ref 3.6–5.1)
Alkaline phosphatase (APISO): 72 U/L (ref 37–153)
BUN: 14 mg/dL (ref 7–25)
CO2: 23 mmol/L (ref 20–32)
Calcium: 9 mg/dL (ref 8.6–10.4)
Chloride: 103 mmol/L (ref 98–110)
Creat: 0.62 mg/dL (ref 0.60–0.95)
Globulin: 1.9 g/dL (ref 1.9–3.7)
Glucose, Bld: 139 mg/dL — ABNORMAL HIGH (ref 65–99)
Potassium: 4.2 mmol/L (ref 3.5–5.3)
Sodium: 135 mmol/L (ref 135–146)
Total Bilirubin: 0.4 mg/dL (ref 0.2–1.2)
Total Protein: 5.9 g/dL — ABNORMAL LOW (ref 6.1–8.1)
eGFR: 85 mL/min/1.73m2 (ref >=60)

## 2024-08-11 LAB — HEMOGLOBIN A1C
Hgb A1c MFr Bld: 6.8 % — ABNORMAL HIGH (ref <5.7)
Mean Plasma Glucose: 148 mg/dL
eAG (mmol/L): 8.2 mmol/L

## 2024-08-17 ENCOUNTER — Other Ambulatory Visit: Payer: Self-pay

## 2024-08-17 ENCOUNTER — Ambulatory Visit

## 2024-08-17 VITALS — BP 144/80 | HR 66 | Ht 67.0 in | Wt 159.4 lb

## 2024-08-17 DIAGNOSIS — I5032 Chronic diastolic (congestive) heart failure: Secondary | ICD-10-CM | POA: Diagnosis not present

## 2024-08-17 DIAGNOSIS — E782 Mixed hyperlipidemia: Secondary | ICD-10-CM

## 2024-08-17 DIAGNOSIS — E1169 Type 2 diabetes mellitus with other specified complication: Secondary | ICD-10-CM

## 2024-08-17 MED ORDER — ATORVASTATIN CALCIUM 20 MG PO TABS: 20.0000 mg | ORAL_TABLET | Freq: Every day | ORAL | 0 refills | Status: AC

## 2024-08-17 NOTE — Telephone Encounter (Signed)
 Copied from CRM #[id number]. Topic: Clinical - Medication Prior Auth >> Aug 17, 2024 12:25 PM Joesph B wrote: Reason for CRM: Patients needs a prior auth for atorvastatin  (LIPITOR) 20 MG tablet [[id number]].

## 2024-08-17 NOTE — Progress Notes (Signed)
 Progress Note  Physician: Johnathin Vanderschaaf A Xana Bradt, MD   HPI: Susan [name] is a 89 y.o. female presenting on 08/17/2024 for Follow-up .  Discussed the use of AI scribe software for clinical note transcription with the patient, who gave verbal consent to proceed.  History of Present Illness   Susan [name] is a 89 year old female with venous insufficiency and type 2 diabetes and CHF who presents for follow-up.  Lower extremity edema and venous insufficiency - Leg swelling and venous insufficiency have remained stable since September - No warmth of the legs - No discomfort in the foot for the past three weeks   Glycemic control in type 2 diabetes mellitus - Type 2 diabetes managed with metformin, two tablets in the morning and one in the evening - Recent hemoglobin A1c is 6.8, improved from 7.0 previously - No episodes of hypoglycemia - No diarrhea associated with metformin use  CHF/AFib - History of congestive heart failure, currently taking Entresto  - On clopidogrel  75 mg daily, metoprolol  50 mg daily, Eliquis  5 mg twice daily, and Lipitor 20 mg daily - No issues with bruising - No shortness of breath or chest pain  Orthostatic symptoms - Occasional dizziness when standing up quickly, resolves spontaneously  Weight stability - Weight stable at 159 pounds         Medical history:  Relevant past medical, surgical, family and social history reviewed and updated as indicated. Interim medical history since our last visit reviewed.  Allergies and medications reviewed and updated.   ROS: Negative unless specifically indicated above in HPI.    Current Outpatient Medications:    acetaminophen  (TYLENOL ) 325 MG tablet, Take 1-2 tablets (325-650 mg total) by mouth every 4 (four) hours as needed for mild pain., Disp: , Rfl:    atorvastatin  (LIPITOR) 20 MG tablet, Take 20 mg by mouth daily., Disp: , Rfl:    clopidogrel  (PLAVIX ) 75 MG tablet, Take 1 tablet (75  mg total) by mouth daily., Disp: 90 tablet, Rfl: 3   ELIQUIS  5 MG TABS tablet, TAKE 1 TABLET BY MOUTH TWICE A DAY, Disp: 60 tablet, Rfl: 6   Glycerin-Hypromellose-PEG 400 (VISINE DRY EYE OP), Apply to eye as needed., Disp: , Rfl:    metFORMIN (GLUCOPHAGE) 500 MG tablet, Take 2 tablets (1000 mg) by mouth in the morning and one tablet (500 mg) in the evening, Disp: , Rfl:    metoprolol  tartrate (LOPRESSOR ) 50 MG tablet, TAKE 1 TABLET (50 MG TOTAL) BY MOUTH IN THE MORNING, AT NOON, AND AT BEDTIME., Disp: 270 tablet, Rfl: 3   Multiple Vitamins-Minerals (PRESERVISION AREDS 2) CAPS, Take 1 capsule by mouth in the morning and at bedtime. Lunch and late afternoon, Disp: , Rfl:    sacubitril -valsartan  (ENTRESTO ) 24-26 MG, Take 1 tablet by mouth 2 (two) times daily., Disp: 60 tablet, Rfl: 1       Objective:     BP (!) 144/80   Pulse 66   Ht 5' 7 (1.702 m)   Wt 159 lb 6.4 oz (72.3 kg)   SpO2 (!) 89%   BMI 24.97 kg/m   Wt Readings from Last 3 Encounters:  08/17/24 159 lb 6.4 oz (72.3 kg)  06/11/24 156 lb 9.6 oz (71 kg)  06/06/24 160 lb 9.6 oz (72.8 kg)    Physical Exam  Physical Exam Vitals reviewed.  Constitutional:      Appearance: Normal appearance. Well-developed with normal weight.  Cardiovascular:  Rate and Rhythm: Normal rate and regular rhythm. Normal heart sounds. Normal peripheral pulses Pulmonary:     Normal breath sounds with normal effort Skin:    General: Skin is warm and dry without noticeable rash. Neurological:     General: No focal deficit present.  Psychiatric:        Mood and Affect: Mood, behavior and cognition normal      Assessment & Plan:   Encounter Diagnoses  Name Primary?   Chronic diastolic CHF (congestive heart failure) (HCC) Yes   DM type 2 with diabetic mixed hyperlipidemia (HCC)     Orders Placed This Encounter  Procedures   CBC with Differential/Platelet   Basic metabolic panel with GFR   Hemoglobin A1c     Assessment and Plan     Chronic diastolic heart failure Well-managed with Entresto , improving symptoms. Blood pressure 144/80 mmHg. No shortness of breath or chest pain. - Continue Entresto  as prescribed. - Monitor blood pressure regularly. - Follow up with cardiologist as scheduled.  Type 2 diabetes mellitus Well-controlled with metformin. Hemoglobin A1c 6.8%. No hypoglycemic episodes. Diet controlled. - Continue metformin as prescribed. - Monitor blood glucose levels regularly. - Ordered labs to check blood glucose levels before next visit.  Chronic venous insufficiency with leg swelling Well-managed. No recent leg warmth, discomfort, or foot pain. - Continue current management and monitor for any changes in symptoms.  Hypertension Managed with current medication. Blood pressure 144/80 mmHg. Occasional dizziness upon standing, likely related to Entresto  and age. - Continue current antihypertensive medications. - Monitor blood pressure regularly.        Plan for follow up in March with labs prior.  Chronic conditions stable and she is doing well given her age.  Lives without assistance in ADL's

## 2024-08-17 NOTE — Telephone Encounter (Signed)
 Copied from CRM #[id number]. Topic: Clinical - Medication Refill >> Aug 17, 2024 12:26 PM Joesph B wrote: Medication: atorvastatin  (LIPITOR) 20 MG tablet [[id number]]  Has the patient contacted their pharmacy? Yes (Agent: If no, request that the patient contact the pharmacy for the refill. If patient does not wish to contact the pharmacy document the reason why and proceed with request.) (Agent: If yes, when and what did the pharmacy advise?)  This is the patient's preferred pharmacy:  [id number] [place] - [address] [place] [address] Phone: [phone] Fax: [phone]  Is this the correct pharmacy for this prescription? Yes If no, delete pharmacy and type the correct one.   Has the prescription been filled recently? Yes  Is the patient out of the medication? No 3 days left   Has the patient been seen for an appointment in the last year OR does the patient have an upcoming appointment? Yes  Can we respond through MyChart? No  Agent: Please be advised that Rx refills may take up to 3 business days. We ask that you follow-up with your pharmacy.

## 2024-08-20 NOTE — Telephone Encounter (Signed)
 Duplicate request, too soon for refill.  Requested Prescriptions  Pending Prescriptions Disp Refills   atorvastatin  (LIPITOR) 20 MG tablet      Sig: Take 1 tablet (20 mg total) by mouth daily.     Cardiovascular:  Antilipid - Statins Failed - 08/20/2024 12:22 PM      Failed - Lipid Panel in normal range within the last 12 months    Cholesterol  Date Value Ref Range Status  05/28/2024 144 <200 mg/dL Final  93/91/7984 836 0 - 200 mg/dL Final   Ldl Cholesterol, Calc  Date Value Ref Range Status  03/11/2014 52 0 - 100 mg/dL Final   LDL Cholesterol (Calc)  Date Value Ref Range Status  05/28/2024 73 mg/dL (calc) Final    Comment:    Reference range: <100 . Desirable range <100 mg/dL for primary prevention;   <70 mg/dL for patients with CHD or diabetic patients  with > or = 2 CHD risk factors. SABRA LDL-C is now calculated using the Martin-Hopkins  calculation, which is a validated novel method providing  better accuracy than the Friedewald equation in the  estimation of LDL-C.  Gladis APPLETHWAITE et al. SANDREA. 7986;689(80): 2061-2068  (http://education.[employer].com/faq/FAQ164)    HDL Cholesterol  Date Value Ref Range Status  03/11/2014 44 40 - 60 mg/dL Final   HDL  Date Value Ref Range Status  05/28/2024 51 > OR = 50 mg/dL Final   Triglycerides  Date Value Ref Range Status  05/28/2024 110 <150 mg/dL Final  93/91/7984 666 (H) 0 - 200 mg/dL Final         Passed - Patient is not pregnant      Passed - Valid encounter within last 12 months    Recent Outpatient Visits           3 days ago Chronic diastolic CHF (congestive heart failure) [facility])   [facility], [place] A, MD   2 months ago Asymptomatic varicose veins of both lower extremities   [place], [place] A, MD   3 months ago Chronic diastolic CHF (congestive heart failure) [facility])   [place] [facility] Everlene Parris LABOR,  MD

## 2024-09-11 ENCOUNTER — Ambulatory Visit: Payer: Medicare Other

## 2024-09-12 LAB — CUP PACEART REMOTE DEVICE CHECK
Battery Remaining Longevity: 95 mo
Battery Remaining Percentage: 78 %
Battery Voltage: 3.01 V
Brady Statistic AP VP Percent: 12 %
Brady Statistic AP VS Percent: 1 %
Brady Statistic AS VP Percent: 86 %
Brady Statistic AS VS Percent: 1 %
Brady Statistic RA Percent Paced: 10 %
Brady Statistic RV Percent Paced: 98 %
Date Time Interrogation Session: 20251209020021
Implantable Lead Connection Status: 753985
Implantable Lead Connection Status: 753985
Implantable Lead Implant Date: 20230612
Implantable Lead Implant Date: 20230612
Implantable Lead Location: 753859
Implantable Lead Location: 753860
Implantable Pulse Generator Implant Date: 20230612
Lead Channel Impedance Value: 510 Ohm
Lead Channel Impedance Value: 540 Ohm
Lead Channel Pacing Threshold Amplitude: 0.75 V
Lead Channel Pacing Threshold Amplitude: 0.875 V
Lead Channel Pacing Threshold Pulse Width: 0.5 ms
Lead Channel Pacing Threshold Pulse Width: 0.5 ms
Lead Channel Sensing Intrinsic Amplitude: 2.8 mV
Lead Channel Sensing Intrinsic Amplitude: 3.7 mV
Lead Channel Setting Pacing Amplitude: 1.125
Lead Channel Setting Pacing Amplitude: 2 V
Lead Channel Setting Pacing Pulse Width: 0.5 ms
Lead Channel Setting Sensing Sensitivity: 0.5 mV
Pulse Gen Model: 2272
Pulse Gen Serial Number: 8087591

## 2024-09-15 ENCOUNTER — Other Ambulatory Visit: Payer: Self-pay

## 2024-09-18 NOTE — Progress Notes (Signed)
 Remote PPM Transmission

## 2024-09-20 ENCOUNTER — Other Ambulatory Visit: Payer: Self-pay

## 2024-09-20 ENCOUNTER — Ambulatory Visit: Payer: Self-pay | Admitting: Cardiology

## 2024-09-20 NOTE — Telephone Encounter (Unsigned)
 Copied from CRM #[id number]. Topic: Clinical - Medication Refill >> Sep 20, 2024 10:02 AM Kevelyn M wrote: Medication: sacubitril -valsartan  (ENTRESTO ) 24-26 MG  Has the patient contacted their pharmacy? Yes (Agent: If no, request that the patient contact the pharmacy for the refill. If patient does not wish to contact the pharmacy document the reason why and proceed with request.) (Agent: If yes, when and what did the pharmacy advise?)  This is the patient's preferred pharmacy:  [id number] [place], [place] - [address] [place] [address] Phone: [phone] Fax: [phone]  Is this the correct pharmacy for this prescription? Yes If no, delete pharmacy and type the correct one.   Has the prescription been filled recently? No  Is the patient out of the medication? No  Has the patient been seen for an appointment in the last year OR does the patient have an upcoming appointment? Yes  Can we respond through MyChart? No  Agent: Please be advised that Rx refills may take up to 3 business days. We ask that you follow-up with your pharmacy.

## 2024-09-24 ENCOUNTER — Other Ambulatory Visit: Payer: Self-pay

## 2024-09-24 NOTE — Telephone Encounter (Signed)
 Requested medication (s) are due for refill today: Yes  Requested medication (s) are on the active medication list: Yes  Last refill:  In chart - 07/23/24    Future visit scheduled: Yes  Notes to clinic:  Manual review.    Requested Prescriptions  Pending Prescriptions Disp Refills   sacubitril -valsartan  (ENTRESTO ) 24-26 MG 60 tablet 1    Sig: Take 1 tablet by mouth 2 (two) times daily.     Off-Protocol Failed - 09/24/2024  2:15 PM      Failed - Medication not assigned to a protocol, review manually.      Passed - Valid encounter within last 12 months    Recent Outpatient Visits           1 month ago Chronic diastolic CHF (congestive heart failure) [facility])   [place] [facility], [place] A, MD   3 months ago Asymptomatic varicose veins of both lower extremities   [place] [facility], [place] A, MD   5 months ago Chronic diastolic CHF (congestive heart failure) [facility])   [place] [facility] Everlene Parris LABOR, MD

## 2024-09-24 NOTE — Telephone Encounter (Signed)
Please review. Sent to the wrong office.  KP

## 2024-09-24 NOTE — Telephone Encounter (Signed)
 Copied from CRM [id number]. Topic: Clinical - Prescription Issue >> Sep 24, 2024  2:57 PM Wess RAMAN wrote: Reason for CRM: Patient is out of sacubitril -valsartan  (ENTRESTO ) 24-26 MG. She has been waiting for awhile for a refill.  Callback #: [id number]  [id number] - [place], [place] - [address] [place] [address] Phone: [phone] Fax: [phone] Hours: Not open 24 hours

## 2024-09-25 MED ORDER — SACUBITRIL-VALSARTAN 24-26 MG PO TABS: 1.0000 | ORAL_TABLET | Freq: Two times a day (BID) | ORAL | 1 refills | Status: AC

## 2024-09-26 NOTE — Telephone Encounter (Signed)
 Requested by patient. Duplicate request. Pharmacy confirmed receipt 09/25/24 at 2:21 pm. Called pharmacy and pharmacy staff reports medication ready for pick up. Requested Prescriptions  Refused Prescriptions Disp Refills   sacubitril -valsartan  (ENTRESTO ) 24-26 MG 60 tablet 1    Sig: Take 1 tablet by mouth 2 (two) times daily.     Off-Protocol Failed - 09/26/2024  1:04 PM      Failed - Medication not assigned to a protocol, review manually.      Passed - Valid encounter within last 12 months    Recent Outpatient Visits           1 month ago Chronic diastolic CHF (congestive heart failure) [facility])   [place] [facility], [place] A, MD   3 months ago Asymptomatic varicose veins of both lower extremities   [place] [facility], [place] A, MD   5 months ago Chronic diastolic CHF (congestive heart failure) [facility])   [place] [facility] Everlene Parris LABOR, MD

## 2024-09-26 NOTE — Telephone Encounter (Signed)
 Called pharmacy to verify confirmation of medication and pharmacy staff reports medication is ready for pick up.

## 2024-09-26 NOTE — Telephone Encounter (Signed)
 Called patient to inform patient medication entresto  is ready for pick up at [facility]

## 2024-09-28 DIAGNOSIS — Z1231 Encounter for screening mammogram for malignant neoplasm of breast: Secondary | ICD-10-CM

## 2024-10-11 ENCOUNTER — Other Ambulatory Visit: Payer: Self-pay

## 2024-10-11 ENCOUNTER — Ambulatory Visit

## 2024-10-11 ENCOUNTER — Telehealth: Payer: Self-pay

## 2024-10-11 DIAGNOSIS — M7989 Other specified soft tissue disorders: Secondary | ICD-10-CM

## 2024-10-11 DIAGNOSIS — N6320 Unspecified lump in the left breast, unspecified quadrant: Secondary | ICD-10-CM

## 2024-10-11 NOTE — Telephone Encounter (Signed)
 Patient is requesting a referral for a mammogram.  Please call the patient once ordered and help her get an appointment if needed.

## 2024-10-23 ENCOUNTER — Ambulatory Visit
Admission: RE | Admit: 2024-10-23 | Discharge: 2024-10-23 | Disposition: A | Discharge status: Home / Self Care | Source: Ambulatory Visit

## 2024-10-23 ENCOUNTER — Ambulatory Visit: Admission: RE | Admit: 2024-10-23 | Discharge: 2024-10-23 | Disposition: A | Source: Ambulatory Visit

## 2024-10-23 ENCOUNTER — Inpatient Hospital Stay: Admission: RE | Admit: 2024-10-23 | Discharge: 2024-10-23 | Disposition: A | Source: Ambulatory Visit

## 2024-10-23 ENCOUNTER — Telehealth: Payer: Self-pay

## 2024-10-23 ENCOUNTER — Other Ambulatory Visit: Payer: Self-pay

## 2024-10-23 DIAGNOSIS — M7989 Other specified soft tissue disorders: Secondary | ICD-10-CM | POA: Diagnosis present

## 2024-10-23 NOTE — Telephone Encounter (Signed)
 Susan [name] from [facility] called for verbal order on right breast US . They are going to enter order for you to sign off on. Thanks!

## 2024-10-24 ENCOUNTER — Other Ambulatory Visit: Payer: Self-pay

## 2024-10-24 DIAGNOSIS — R928 Other abnormal and inconclusive findings on diagnostic imaging of breast: Secondary | ICD-10-CM

## 2024-10-25 ENCOUNTER — Ambulatory Visit
Admission: RE | Admit: 2024-10-25 | Discharge: 2024-10-25 | Disposition: A | Discharge status: Home / Self Care | Source: Ambulatory Visit

## 2024-10-25 DIAGNOSIS — N6323 Unspecified lump in the left breast, lower outer quadrant: Secondary | ICD-10-CM | POA: Diagnosis not present

## 2024-10-25 DIAGNOSIS — R928 Other abnormal and inconclusive findings on diagnostic imaging of breast: Secondary | ICD-10-CM

## 2024-10-25 DIAGNOSIS — N6321 Unspecified lump in the left breast, upper outer quadrant: Secondary | ICD-10-CM | POA: Insufficient documentation

## 2024-10-25 DIAGNOSIS — R590 Localized enlarged lymph nodes: Secondary | ICD-10-CM | POA: Insufficient documentation

## 2024-10-25 HISTORY — PX: BREAST BIOPSY: SHX20

## 2024-10-25 MED ORDER — LIDOCAINE-EPINEPHRINE 1 %-1:100000 IJ SOLN
8.0000 mL | Freq: Once | INTRAMUSCULAR | Status: AC
  Administered 2024-10-25: 8 mL via INTRADERMAL
  Filled 2024-10-25: qty 10

## 2024-10-25 MED ORDER — LIDOCAINE 1 % OPTIME INJ - NO CHARGE
2.0000 mL | Freq: Once | INTRAMUSCULAR | Status: AC
  Administered 2024-10-25: 2 mL via INTRADERMAL
  Filled 2024-10-25: qty 2

## 2024-10-25 MED ORDER — LIDOCAINE HCL 1 % IJ SOLN
2.0000 mL | Freq: Once | INTRAMUSCULAR | Status: AC
  Administered 2024-10-25: 2 mL via INTRADERMAL
  Filled 2024-10-25: qty 2

## 2024-10-26 LAB — SURGICAL PATHOLOGY

## 2024-10-30 ENCOUNTER — Encounter: Payer: Self-pay | Admitting: *Deleted

## 2024-10-30 ENCOUNTER — Telehealth: Payer: Self-pay | Admitting: *Deleted

## 2024-10-30 DIAGNOSIS — C50912 Malignant neoplasm of unspecified site of left female breast: Secondary | ICD-10-CM

## 2024-10-30 NOTE — Telephone Encounter (Signed)
 Patient called triage and requested NP referral to establish with oncology for her breast cancer. She is newly dx. Requesting a call back from [facility].

## 2024-10-30 NOTE — Progress Notes (Signed)
 Received referral for newly diagnosed breast cancer from [facility].  Navigation initiated.  She will see Dr. Jacobo on Thursday 1/29.  She will discuss surgical consultation with Dr. Jacobo on Thursday.

## 2024-11-01 ENCOUNTER — Encounter: Payer: Self-pay | Admitting: Oncology

## 2024-11-01 ENCOUNTER — Inpatient Hospital Stay

## 2024-11-01 ENCOUNTER — Inpatient Hospital Stay: Attending: Oncology | Admitting: Oncology

## 2024-11-01 ENCOUNTER — Encounter: Payer: Self-pay | Admitting: *Deleted

## 2024-11-01 VITALS — BP 177/90 | HR 66 | Temp 98.3°F | Resp 20 | Wt 161.1 lb

## 2024-11-01 DIAGNOSIS — Z17421 Hormone receptor negative with human epidermal growth factor receptor 2 negative status: Secondary | ICD-10-CM | POA: Diagnosis not present

## 2024-11-01 DIAGNOSIS — N6453 Retraction of nipple: Secondary | ICD-10-CM | POA: Insufficient documentation

## 2024-11-01 DIAGNOSIS — Z8249 Family history of ischemic heart disease and other diseases of the circulatory system: Secondary | ICD-10-CM | POA: Diagnosis not present

## 2024-11-01 DIAGNOSIS — Z7902 Long term (current) use of antithrombotics/antiplatelets: Secondary | ICD-10-CM | POA: Diagnosis not present

## 2024-11-01 DIAGNOSIS — Z8673 Personal history of transient ischemic attack (TIA), and cerebral infarction without residual deficits: Secondary | ICD-10-CM | POA: Insufficient documentation

## 2024-11-01 DIAGNOSIS — R609 Edema, unspecified: Secondary | ICD-10-CM | POA: Diagnosis not present

## 2024-11-01 DIAGNOSIS — Z8051 Family history of malignant neoplasm of kidney: Secondary | ICD-10-CM | POA: Diagnosis not present

## 2024-11-01 DIAGNOSIS — R234 Changes in skin texture: Secondary | ICD-10-CM | POA: Diagnosis not present

## 2024-11-01 DIAGNOSIS — E119 Type 2 diabetes mellitus without complications: Secondary | ICD-10-CM | POA: Diagnosis not present

## 2024-11-01 DIAGNOSIS — R590 Localized enlarged lymph nodes: Secondary | ICD-10-CM | POA: Insufficient documentation

## 2024-11-01 DIAGNOSIS — I10 Essential (primary) hypertension: Secondary | ICD-10-CM | POA: Diagnosis not present

## 2024-11-01 DIAGNOSIS — Z79899 Other long term (current) drug therapy: Secondary | ICD-10-CM | POA: Diagnosis not present

## 2024-11-01 DIAGNOSIS — C50912 Malignant neoplasm of unspecified site of left female breast: Secondary | ICD-10-CM | POA: Insufficient documentation

## 2024-11-01 DIAGNOSIS — N6489 Other specified disorders of breast: Secondary | ICD-10-CM | POA: Diagnosis not present

## 2024-11-01 DIAGNOSIS — N6323 Unspecified lump in the left breast, lower outer quadrant: Secondary | ICD-10-CM | POA: Diagnosis not present

## 2024-11-01 DIAGNOSIS — I480 Paroxysmal atrial fibrillation: Secondary | ICD-10-CM | POA: Diagnosis not present

## 2024-11-01 DIAGNOSIS — I2510 Atherosclerotic heart disease of native coronary artery without angina pectoris: Secondary | ICD-10-CM | POA: Diagnosis not present

## 2024-11-01 DIAGNOSIS — N6019 Diffuse cystic mastopathy of unspecified breast: Secondary | ICD-10-CM | POA: Insufficient documentation

## 2024-11-01 DIAGNOSIS — Z860100 Personal history of colon polyps, unspecified: Secondary | ICD-10-CM | POA: Insufficient documentation

## 2024-11-01 DIAGNOSIS — Z7901 Long term (current) use of anticoagulants: Secondary | ICD-10-CM | POA: Insufficient documentation

## 2024-11-01 DIAGNOSIS — Z801 Family history of malignant neoplasm of trachea, bronchus and lung: Secondary | ICD-10-CM | POA: Insufficient documentation

## 2024-11-01 DIAGNOSIS — Z90710 Acquired absence of both cervix and uterus: Secondary | ICD-10-CM | POA: Insufficient documentation

## 2024-11-01 DIAGNOSIS — C50412 Malignant neoplasm of upper-outer quadrant of left female breast: Secondary | ICD-10-CM | POA: Insufficient documentation

## 2024-11-01 DIAGNOSIS — Z9049 Acquired absence of other specified parts of digestive tract: Secondary | ICD-10-CM | POA: Insufficient documentation

## 2024-11-01 LAB — CBC WITH DIFFERENTIAL/PLATELET
Abs Immature Granulocytes: 0.03 10*3/uL (ref 0.00–0.07)
Basophils Absolute: 0 10*3/uL (ref 0.0–0.1)
Basophils Relative: 0 %
Eosinophils Absolute: 0.1 10*3/uL (ref 0.0–0.5)
Eosinophils Relative: 1 %
HCT: 39.3 % (ref 36.0–46.0)
Hemoglobin: 12.7 g/dL (ref 12.0–15.0)
Immature Granulocytes: 0 %
Lymphocytes Relative: 28 %
Lymphs Abs: 2 10*3/uL (ref 0.7–4.0)
MCH: 28.7 pg (ref 26.0–34.0)
MCHC: 32.3 g/dL (ref 30.0–36.0)
MCV: 88.9 fL (ref 80.0–100.0)
Monocytes Absolute: 0.6 10*3/uL (ref 0.1–1.0)
Monocytes Relative: 8 %
Neutro Abs: 4.6 10*3/uL (ref 1.7–7.7)
Neutrophils Relative %: 63 %
Platelets: 250 10*3/uL (ref 150–400)
RBC: 4.42 MIL/uL (ref 3.87–5.11)
RDW: 13.3 % (ref 11.5–15.5)
WBC: 7.3 10*3/uL (ref 4.0–10.5)
nRBC: 0 % (ref 0.0–0.2)

## 2024-11-01 LAB — COMPREHENSIVE METABOLIC PANEL WITH GFR
ALT: 13 U/L (ref 0–44)
AST: 25 U/L (ref 15–41)
Albumin: 4.3 g/dL (ref 3.5–5.0)
Alkaline Phosphatase: 90 U/L (ref 38–126)
Anion gap: 12 (ref 5–15)
BUN: 10 mg/dL (ref 8–23)
CO2: 23 mmol/L (ref 22–32)
Calcium: 9.5 mg/dL (ref 8.9–10.3)
Chloride: 103 mmol/L (ref 98–111)
Creatinine, Ser: 0.65 mg/dL (ref 0.44–1.00)
GFR, Estimated: >60 mL/min
Glucose, Bld: 98 mg/dL (ref 70–99)
Potassium: 4.1 mmol/L (ref 3.5–5.1)
Sodium: 139 mmol/L (ref 135–145)
Total Bilirubin: 0.3 mg/dL (ref 0.0–1.2)
Total Protein: 6.6 g/dL (ref 6.5–8.1)

## 2024-11-01 NOTE — Progress Notes (Signed)
 Patient states she has knot on the left breast that came up about a month ago and that's why she is here.

## 2024-11-01 NOTE — Progress Notes (Signed)
Received referral for newly diagnosed breast cancer from [facility].  Navigation initiated.  Spoke with daughter Maureen to set up appointments.   She will see both med onc and surgeon tomorrow, Dr. Agrawal at 2:00 and Dr. Sakai at 10:15. 

## 2024-11-01 NOTE — Progress Notes (Signed)
 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  INTERVAL HISTORY: Patient is a 89 year old female who noticed an enlarging lump on her breast at least 1 month ago.  Subsequent mammogram, ultrasound, biopsy revealed the above-stated malignancy.  She otherwise feels well.  She has no neurologic complaints.  She denies any recent fevers or illnesses.  She has a good appetite and denies weight loss.  She has no chest pain, shortness of breath, cough, or hemoptysis.  She denies any nausea, vomiting, constipation, or diarrhea.  She has no urinary complaints.  Patient offers no further specific complaints today.  REVIEW OF SYSTEMS:   Review of Systems  Constitutional: Negative.  Negative for fever, malaise/fatigue and weight loss.  Respiratory: Negative.  Negative for cough, hemoptysis and shortness of breath.   Cardiovascular: Negative.  Negative for chest pain and leg swelling.  Gastrointestinal: Negative.  Negative for abdominal pain.  Genitourinary: Negative.  Negative for dysuria.  Musculoskeletal: Negative.  Negative for back pain.  Skin: Negative.  Negative for rash.  Neurological: Negative.  Negative for dizziness, focal weakness, weakness and headaches.  Psychiatric/Behavioral: Negative.  The patient is not nervous/anxious.     As per HPI. Otherwise, a complete review of systems is negative.  PAST MEDICAL HISTORY: Past Medical History:  Diagnosis Date   AV block, Mobitz II    a. 03/2022 s/p [employer] Assurity MRI PM 2272 (ser# [id number]) DC  PPM w/ L bundle area lead.   Colon polyp    Diastolic dysfunction    a. 03/2022 Echo: EF 60-65%, no rwma, GrI DD, nl RV fxn, mild-mod MR, mild AS.   Diffuse cystic mastopathy    Hyperlipidemia    Hypertension    Macular degeneration    Nipple discharge 03/22/2013   PAF (paroxysmal atrial fibrillation) (HCC)    a. 12/3022 noted on device interrogation-->[id number] = 8.   Stroke (Ocular)    Tachycardia    Type 2 diabetes mellitus (HCC)     PAST SURGICAL HISTORY: Past Surgical History:  Procedure Laterality Date   ABDOMINAL HYSTERECTOMY     APPENDECTOMY     BREAST BIOPSY Bilateral 2006   benign, clip markers in both breasts   BREAST BIOPSY Left 10/25/2024   US  LT BREAST BX W LOC DEV 1ST LESION IMG BX SPEC US  GUIDE 10/25/2024 [facility]   BREAST BIOPSY Left 10/25/2024   US  LT BREAST BX W LOC DEV EA ADD LESION IMG BX SPEC US  GUIDE 10/25/2024 [facility]   COLONOSCOPY  05/03/2015   EYE SURGERY Bilateral 2014   cataract   LEFT HEART CATH AND CORONARY ANGIOGRAPHY N/A 12/23/2022   Procedure: LEFT HEART CATH AND CORONARY ANGIOGRAPHY;  Surgeon: Anner Alm ORN, MD;  Location: [facility] INVASIVE CV LAB;  Service: Cardiovascular;  Laterality: N/A;   PACEMAKER IMPLANT N/A 03/15/2022   Procedure: PACEMAKER IMPLANT;  Surgeon: Cindie Ole DASEN, MD;  Location: [facility] INVASIVE CV LAB;  Service: Cardiovascular;  Laterality: N/A;   salpingo oophorectmy      FAMILY HISTORY: Family History  Problem Relation Age of Onset   Aortic aneurysm Mother  died from ruptured Ao.   Lung cancer Father    Kidney cancer Maternal Aunt    Breast cancer Neg Hx     ADVANCED DIRECTIVES (Y/N):  N  HEALTH MAINTENANCE: Social History[1]   Colonoscopy:  PAP:  Bone density:  Lipid panel:  Allergies[2]  Current Outpatient Medications  Medication Sig Dispense Refill   acetaminophen  (TYLENOL ) 325 MG tablet Take 1-2 tablets (325-650 mg total) by mouth every 4 (four) hours as needed for mild pain.      atorvastatin  (LIPITOR) 20 MG tablet Take 1 tablet (20 mg total) by mouth daily. 90 tablet 0   clopidogrel  (PLAVIX ) 75 MG tablet Take 1 tablet (75 mg total) by mouth daily. 90 tablet 3   ELIQUIS  5 MG TABS tablet TAKE 1 TABLET BY MOUTH TWICE A DAY 60 tablet 6   Glycerin-Hypromellose-PEG 400 (VISINE DRY EYE OP) Apply to eye as needed.     metFORMIN (GLUCOPHAGE) 500 MG tablet Take 2 tablets (1000 mg) by mouth in the morning and one tablet (500 mg) in the evening     metoprolol  tartrate (LOPRESSOR ) 50 MG tablet TAKE 1 TABLET (50 MG TOTAL) BY MOUTH IN THE MORNING, AT NOON, AND AT BEDTIME. 270 tablet 3   Multiple Vitamins-Minerals (PRESERVISION AREDS 2) CAPS Take 1 capsule by mouth in the morning and at bedtime. Lunch and late afternoon     sacubitril -valsartan  (ENTRESTO ) 24-26 MG Take 1 tablet by mouth 2 (two) times daily. 60 tablet 1   No current facility-administered medications for this visit.    OBJECTIVE: Vitals:   11/01/24 1119  BP: (!) 177/90  Pulse: 66  Resp: 20  Temp: 98.3 F (36.8 C)  SpO2: 100%     Body mass index is 25.23 kg/m.    ECOG FS:0 - Asymptomatic  General: Well-developed, well-nourished, no acute distress. Eyes: Pink conjunctiva, anicteric sclera. HEENT: Normocephalic, moist mucous membranes. Breast: Easily palpable left breast mass and axillary lymphadenopathy.  Skin involvement noted on exam. Lungs: No audible wheezing or coughing. Heart: Regular rate and rhythm. Abdomen: Soft, nontender, no obvious distention. Musculoskeletal: No edema, cyanosis, or clubbing. Neuro: Alert, answering all questions appropriately. Cranial nerves grossly intact. Skin: No rashes or petechiae noted. Psych: Normal affect.  LAB RESULTS:  Lab Results  Component Value Date   NA 135 08/10/2024   K 4.2 08/10/2024   CL 103 08/10/2024   CO2 23 08/10/2024   GLUCOSE 139 (H) 08/10/2024   BUN 14 08/10/2024   CREATININE 0.62 08/10/2024   CALCIUM  9.0 08/10/2024   PROT 5.9 (L) 08/10/2024    ALBUMIN 3.9 12/23/2022   AST 15 08/10/2024   ALT 10 08/10/2024   ALKPHOS 65 12/23/2022   BILITOT 0.4 08/10/2024   GFRNONAA >60 12/23/2022   GFRAA >60 03/11/2014    Lab Results  Component Value Date   WBC 7.3 11/01/2024   NEUTROABS 4.6 11/01/2024   HGB 12.7 11/01/2024   HCT 39.3 11/01/2024   MCV 88.9 11/01/2024   PLT 250 11/01/2024     STUDIES: US  LT BREAST BX W LOC DEV 1ST LESION IMG BX SPEC US  GUIDE Addendum Date: 10/30/2024 ADDENDUM REPORT: 10/30/2024 07:42 ADDENDUM: PATHOLOGY revealed: Site 1. Breast, left, needle core biopsy, 2:00 6 cmfn 7.5 cm, (coil clip)- INVASIVE DUCTAL CARCINOMA- OVERALL GRADE: 3- LYMPHOVASCULAR INVASION: NOT IDENTIFIED- CANCER LENGTH: 1.1 CM - CALCIFICATIONS: NOT IDENTIFIED Pathology results are CONCORDANT with imaging findings, per Dr. Norleen Croak. PATHOLOGY revealed: Site 2. Breast, left, needle core biopsy, 4:00 3 cmfn 3.7 cm (  venus clip)- INVASIVE DUCTAL CARCINOMA- OVERALL GRADE: 3- LYMPHOVASCULAR INVASION: NOT IDENTIFIED- CANCER LENGTH: 0.9 CM - CALCIFICATIONS: NOT IDENTIFIED Pathology results are CONCORDANT with imaging findings, per Dr. Norleen Croak. PATHOLOGY revealed: Site 3. Lymph node, needle/core biopsy, Left axillary (hydromark coil clip)- METASTATIC CARCINOMA INVOLVING A LYMPH NODE Pathology results are CONCORDANT with imaging findings, per Dr. Norleen Croak. Pathology results and recommendations below were discussed with patient by telephone. Patient reported biopsy site within normal limits with slight tenderness at the site. Post biopsy care instructions were reviewed, questions were answered and my direct phone number was provided to patient. Patient was instructed to call [facility] if any concerns or questions arise related to the biopsy. RECOMMENDATIONS: Surgical and oncological consultation. Request for surgical and oncological consultation was relayed to Shasta Ada, Nurse Navigator at [facility]. Pathology results  reported by Mliss CHARM Molt RN 10/26/2024. Electronically Signed   By: Norleen Croak M.D.   On: 10/30/2024 07:42   Result Date: 10/30/2024 CLINICAL DATA:  Multiple BI-RADS 5 masses and axillary lymphadenopathy EXAM: ULTRASOUND GUIDED LEFT BREAST CORE NEEDLE BIOPSY x 2; US  AXILLARY NODE CORE BIOPSY LEFT COMPARISON:  Previous exam(s). PROCEDURE: I met with the patient and we discussed the procedure of ultrasound-guided biopsy, including benefits and alternatives. We discussed the high likelihood of a successful procedure. We discussed the risks of the procedure, including infection, bleeding, tissue injury, clip migration, and inadequate sampling. Informed written consent was given. The usual time-out protocol was performed immediately prior to the procedure. Site 1: LEFT breast mass 2 o'clock 6 cm from the nipple Lesion quadrant: Upper-outer Using sterile technique and 1% lidocaine  and 1% lidocaine  with epinephrine  as local anesthetic, under direct ultrasound visualization, a 12 gauge spring-loaded device was used to perform biopsy of the LEFT breast mass at 2 o'clock 6 cm from the nipple using a LATERAL approach. At the conclusion of the procedure coil-shaped tissue marker clip was deployed into the biopsy cavity. Site 2: LEFT breast 4 o'clock 3 cm from the nipple Lesion quadrant: Lower outer Using sterile technique and 1% lidocaine  and 1% lidocaine  with epinephrine  as local anesthetic, under direct ultrasound visualization, a 12 gauge spring-loaded device was used to perform biopsy of the LEFT breast mass at 4 o'clock 3 cm from the nipple using a LATERAL approach. At the conclusion of the procedure Venus-shaped tissue marker clip was deployed into the biopsy cavity. Site 3: LEFT axillary node Using sterile technique and 1% lidocaine  and 1% lidocaine  with epinephrine  as local anesthetic, under direct ultrasound visualization, a 14 gauge spring-loaded device was used to perform biopsy of an enlarged LEFT axillary node  using a inferior approach. At the conclusion of the procedure [facility] coil-shaped tissue marker clip was deployed into the biopsy cavity. Follow up 2 view mammogram was performed and dictated separately. IMPRESSION: Ultrasound guided biopsy of two BI-RADS 5 masses in the LEFT breast and an enlarged LEFT axillary lymph node. No apparent complications. Electronically Signed: By: Norleen Croak M.D. On: 10/25/2024 12:33   US  LT BREAST BX W LOC DEV EA ADD LESION IMG BX SPEC US  GUIDE Addendum Date: 10/30/2024 ADDENDUM REPORT: 10/30/2024 07:42 ADDENDUM: PATHOLOGY revealed: Site 1. Breast, left, needle core biopsy, 2:00 6 cmfn 7.5 cm, (coil clip)- INVASIVE DUCTAL CARCINOMA- OVERALL GRADE: 3- LYMPHOVASCULAR INVASION: NOT IDENTIFIED- CANCER LENGTH: 1.1 CM - CALCIFICATIONS: NOT IDENTIFIED Pathology results are CONCORDANT with imaging findings, per Dr. Norleen Croak. PATHOLOGY revealed: Site 2. Breast, left, needle core biopsy, 4:00 3  cmfn 3.7 cm (venus clip)- INVASIVE DUCTAL CARCINOMA- OVERALL GRADE: 3- LYMPHOVASCULAR INVASION: NOT IDENTIFIED- CANCER LENGTH: 0.9 CM - CALCIFICATIONS: NOT IDENTIFIED Pathology results are CONCORDANT with imaging findings, per Dr. Norleen Croak. PATHOLOGY revealed: Site 3. Lymph node, needle/core biopsy, Left axillary (hydromark coil clip)- METASTATIC CARCINOMA INVOLVING A LYMPH NODE Pathology results are CONCORDANT with imaging findings, per Dr. Norleen Croak. Pathology results and recommendations below were discussed with patient by telephone. Patient reported biopsy site within normal limits with slight tenderness at the site. Post biopsy care instructions were reviewed, questions were answered and my direct phone number was provided to patient. Patient was instructed to call [facility] if any concerns or questions arise related to the biopsy. RECOMMENDATIONS: Surgical and oncological consultation. Request for surgical and oncological consultation was relayed to Shasta Ada, Nurse  Navigator at [facility]. Pathology results reported by Mliss CHARM Molt RN 10/26/2024. Electronically Signed   By: Norleen Croak M.D.   On: 10/30/2024 07:42   Result Date: 10/30/2024 CLINICAL DATA:  Multiple BI-RADS 5 masses and axillary lymphadenopathy EXAM: ULTRASOUND GUIDED LEFT BREAST CORE NEEDLE BIOPSY x 2; US  AXILLARY NODE CORE BIOPSY LEFT COMPARISON:  Previous exam(s). PROCEDURE: I met with the patient and we discussed the procedure of ultrasound-guided biopsy, including benefits and alternatives. We discussed the high likelihood of a successful procedure. We discussed the risks of the procedure, including infection, bleeding, tissue injury, clip migration, and inadequate sampling. Informed written consent was given. The usual time-out protocol was performed immediately prior to the procedure. Site 1: LEFT breast mass 2 o'clock 6 cm from the nipple Lesion quadrant: Upper-outer Using sterile technique and 1% lidocaine  and 1% lidocaine  with epinephrine  as local anesthetic, under direct ultrasound visualization, a 12 gauge spring-loaded device was used to perform biopsy of the LEFT breast mass at 2 o'clock 6 cm from the nipple using a LATERAL approach. At the conclusion of the procedure coil-shaped tissue marker clip was deployed into the biopsy cavity. Site 2: LEFT breast 4 o'clock 3 cm from the nipple Lesion quadrant: Lower outer Using sterile technique and 1% lidocaine  and 1% lidocaine  with epinephrine  as local anesthetic, under direct ultrasound visualization, a 12 gauge spring-loaded device was used to perform biopsy of the LEFT breast mass at 4 o'clock 3 cm from the nipple using a LATERAL approach. At the conclusion of the procedure Venus-shaped tissue marker clip was deployed into the biopsy cavity. Site 3: LEFT axillary node Using sterile technique and 1% lidocaine  and 1% lidocaine  with epinephrine  as local anesthetic, under direct ultrasound visualization, a 14 gauge spring-loaded  device was used to perform biopsy of an enlarged LEFT axillary node using a inferior approach. At the conclusion of the procedure [facility] coil-shaped tissue marker clip was deployed into the biopsy cavity. Follow up 2 view mammogram was performed and dictated separately. IMPRESSION: Ultrasound guided biopsy of two BI-RADS 5 masses in the LEFT breast and an enlarged LEFT axillary lymph node. No apparent complications. Electronically Signed: By: Norleen Croak M.D. On: 10/25/2024 12:33   US  AXILLARY NODE CORE BIOPSY LEFT Addendum Date: 10/30/2024 ADDENDUM REPORT: 10/30/2024 07:42 ADDENDUM: PATHOLOGY revealed: Site 1. Breast, left, needle core biopsy, 2:00 6 cmfn 7.5 cm, (coil clip)- INVASIVE DUCTAL CARCINOMA- OVERALL GRADE: 3- LYMPHOVASCULAR INVASION: NOT IDENTIFIED- CANCER LENGTH: 1.1 CM - CALCIFICATIONS: NOT IDENTIFIED Pathology results are CONCORDANT with imaging findings, per Dr. Norleen Croak. PATHOLOGY revealed: Site 2. Breast, left, needle core biopsy, 4:00 3 cmfn 3.7 cm (venus clip)- INVASIVE  DUCTAL CARCINOMA- OVERALL GRADE: 3- LYMPHOVASCULAR INVASION: NOT IDENTIFIED- CANCER LENGTH: 0.9 CM - CALCIFICATIONS: NOT IDENTIFIED Pathology results are CONCORDANT with imaging findings, per Dr. Norleen Croak. PATHOLOGY revealed: Site 3. Lymph node, needle/core biopsy, Left axillary (hydromark coil clip)- METASTATIC CARCINOMA INVOLVING A LYMPH NODE Pathology results are CONCORDANT with imaging findings, per Dr. Norleen Croak. Pathology results and recommendations below were discussed with patient by telephone. Patient reported biopsy site within normal limits with slight tenderness at the site. Post biopsy care instructions were reviewed, questions were answered and my direct phone number was provided to patient. Patient was instructed to call [facility] if any concerns or questions arise related to the biopsy. RECOMMENDATIONS: Surgical and oncological consultation. Request for surgical and oncological  consultation was relayed to Shasta Ada, Nurse Navigator at [facility]. Pathology results reported by Mliss CHARM Molt RN 10/26/2024. Electronically Signed   By: Norleen Croak M.D.   On: 10/30/2024 07:42   Result Date: 10/30/2024 CLINICAL DATA:  Multiple BI-RADS 5 masses and axillary lymphadenopathy EXAM: ULTRASOUND GUIDED LEFT BREAST CORE NEEDLE BIOPSY x 2; US  AXILLARY NODE CORE BIOPSY LEFT COMPARISON:  Previous exam(s). PROCEDURE: I met with the patient and we discussed the procedure of ultrasound-guided biopsy, including benefits and alternatives. We discussed the high likelihood of a successful procedure. We discussed the risks of the procedure, including infection, bleeding, tissue injury, clip migration, and inadequate sampling. Informed written consent was given. The usual time-out protocol was performed immediately prior to the procedure. Site 1: LEFT breast mass 2 o'clock 6 cm from the nipple Lesion quadrant: Upper-outer Using sterile technique and 1% lidocaine  and 1% lidocaine  with epinephrine  as local anesthetic, under direct ultrasound visualization, a 12 gauge spring-loaded device was used to perform biopsy of the LEFT breast mass at 2 o'clock 6 cm from the nipple using a LATERAL approach. At the conclusion of the procedure coil-shaped tissue marker clip was deployed into the biopsy cavity. Site 2: LEFT breast 4 o'clock 3 cm from the nipple Lesion quadrant: Lower outer Using sterile technique and 1% lidocaine  and 1% lidocaine  with epinephrine  as local anesthetic, under direct ultrasound visualization, a 12 gauge spring-loaded device was used to perform biopsy of the LEFT breast mass at 4 o'clock 3 cm from the nipple using a LATERAL approach. At the conclusion of the procedure Venus-shaped tissue marker clip was deployed into the biopsy cavity. Site 3: LEFT axillary node Using sterile technique and 1% lidocaine  and 1% lidocaine  with epinephrine  as local anesthetic, under direct  ultrasound visualization, a 14 gauge spring-loaded device was used to perform biopsy of an enlarged LEFT axillary node using a inferior approach. At the conclusion of the procedure [facility] coil-shaped tissue marker clip was deployed into the biopsy cavity. Follow up 2 view mammogram was performed and dictated separately. IMPRESSION: Ultrasound guided biopsy of two BI-RADS 5 masses in the LEFT breast and an enlarged LEFT axillary lymph node. No apparent complications. Electronically Signed: By: Norleen Croak M.D. On: 10/25/2024 12:33   MM CLIP PLACEMENT LEFT Result Date: 10/25/2024 CLINICAL DATA:  Status post LEFT breast ultrasound guided biopsy x3 EXAM: 3D DIAGNOSTIC LEFT MAMMOGRAM POST ULTRASOUND BIOPSY COMPARISON:  Previous exam(s). ACR Breast Density Category d: The breasts are extremely dense, which lowers the sensitivity of mammography. FINDINGS: 3D Mammographic images were obtained following ultrasound guided biopsy of multiple LEFT breast masses and a LEFT axillary lymph node. Appropriate positioning of the coil-shaped biopsy marking clip at the site of biopsy in the  LEFT breast at 2 o'clock. Appropriate positioning of the Venus-shaped biopsy marking clip at the site of biopsy in the LEFT breast at the site of 4 o'clock mass (localizing slightly more MEDIAL given size of mass). The axillary node HydroMARK clip could not be visualized due to far posterior position and the presence of a pacemaker. IMPRESSION: Appropriate positioning of the biopsy marking clips at the sites of biopsy in the LEFT breast. The LEFT axillary node clip can not be visualized mammographically but was appropriately positioned on sonographic evaluation. Final Assessment: Post Procedure Mammograms for Marker Placement Electronically Signed   By: Norleen Croak M.D.   On: 10/25/2024 12:35   MM 3D DIAGNOSTIC MAMMOGRAM BILATERAL BREAST Result Date: 10/23/2024 CLINICAL DATA:  LEFT breast palpable EXAM: DIGITAL DIAGNOSTIC BILATERAL  MAMMOGRAM WITH TOMOSYNTHESIS AND CAD; ULTRASOUND LEFT BREAST LIMITED; ULTRASOUND RIGHT BREAST LIMITED TECHNIQUE: Bilateral digital diagnostic mammography and breast tomosynthesis was performed. The images were evaluated with computer-aided detection. ; Targeted ultrasound examination of the left breast was performed.; Targeted ultrasound examination of the right breast was performed COMPARISON:  Previous exam(s). ACR Breast Density Category d: The breasts are extremely dense, which lowers the sensitivity of mammography. FINDINGS: Spot compression tomosynthesis views were obtained of the site of palpable concern in the LEFT breast. There is a focal asymmetry with associated architectural distortion subjacent to the site of palpable concern. There is globally increased asymmetry within the LEFT breast, most predominant in the upper outer and lower outer quadrants. There is new LEFT-sided skin thickening and nipple retraction. A questioned asymmetry in the RIGHT breast resolved with additional views, most consistent with overlapping tissue. No suspicious mass, distortion, or microcalcifications are identified to suggest presence of malignancy. On physical exam, the LEFT breast is hard and firm. There is a peau de orange appearance of the skin. Targeted ultrasound was performed of the site of palpable concern in the LEFT breast. Encompassing the near entirety of the LEFT upper outer quadrant (documented at 2 o'clock 6 cm from the nipple), there is a heterogeneous hypoechoic mass. Exact measurements are challenging to ascertain given size. It is estimated to span at least 7.3 x 3.2 x 7.0 cm. There are multiple lobulations of this mass extending throughout the entirety of the LEFT upper outer quadrant. There is a distinct hypoechoic mass in the LEFT lower outer quadrant at 4 o'clock 3 cm from the nipple which appear sonographically distinct from the dominant mass. This measures 3.5 x 1.7 by 3.7 cm. This likely corresponds  to the asymmetry in the lower outer quadrant noted mammographically. Diffuse skin thickening is noted. Subcutaneous edema is noted. Targeted ultrasound was performed of the LEFT axilla. There are several enlarged LEFT axillary lymph nodes noted (approximately 3). They demonstrate diffusely thickened cortices with preserved echogenic hila and measure up to 9 mm in thickness. Targeted ultrasound was performed of the RIGHT upper outer breast. No suspicious cystic or solid mass is seen at the site of asymmetry concern. IMPRESSION: 1. There is a highly suspicious mass at the site of palpable concern in the LEFT breast. This mass appears to encompass the near entirety of the LEFT upper outer quadrant and spans at least 7.3 cm. Recommend ultrasound-guided biopsy for definitive characterization. 2. There is an additional distinct 3.7 cm mass at 4 o'clock 3 cm from the nipple. This likely reflects a satellite lesion in the setting of extensive multicentric disease. Recommend ultrasound-guided biopsy for definitive characterization. 3. There are at least 3 enlarged LEFT axillary lymph  nodes. Recommend ultrasound-guided biopsy of a lymph node amenable to biopsy for definitive characterization. 4. There is asymmetric LEFT breast skin thickening and nipple retraction. This could reflect underlying inflammatory breast cancer. Skin punch biopsy could be considered by treatment team if clinically indicated. 5. No mammographic evidence of malignancy in the RIGHT breast. RECOMMENDATION: LEFT breast ultrasound-guided biopsy x2 LEFT axillary ultrasound-guided biopsy x1 I have discussed the findings and recommendations with the patient. The biopsy procedure was discussed with the patient and questions were answered. Patient expressed their understanding of the biopsy recommendation. Patient will be scheduled for biopsy at her earliest convenience by the schedulers. Ordering provider will be notified. If applicable, a reminder letter  will be sent to the patient regarding the next appointment. BI-RADS CATEGORY  5: Highly suggestive of malignancy. Electronically Signed   By: Corean Salter M.D.   On: 10/23/2024 16:16   US  LIMITED ULTRASOUND INCLUDING AXILLA LEFT BREAST  Result Date: 10/23/2024 CLINICAL DATA:  LEFT breast palpable EXAM: DIGITAL DIAGNOSTIC BILATERAL MAMMOGRAM WITH TOMOSYNTHESIS AND CAD; ULTRASOUND LEFT BREAST LIMITED; ULTRASOUND RIGHT BREAST LIMITED TECHNIQUE: Bilateral digital diagnostic mammography and breast tomosynthesis was performed. The images were evaluated with computer-aided detection. ; Targeted ultrasound examination of the left breast was performed.; Targeted ultrasound examination of the right breast was performed COMPARISON:  Previous exam(s). ACR Breast Density Category d: The breasts are extremely dense, which lowers the sensitivity of mammography. FINDINGS: Spot compression tomosynthesis views were obtained of the site of palpable concern in the LEFT breast. There is a focal asymmetry with associated architectural distortion subjacent to the site of palpable concern. There is globally increased asymmetry within the LEFT breast, most predominant in the upper outer and lower outer quadrants. There is new LEFT-sided skin thickening and nipple retraction. A questioned asymmetry in the RIGHT breast resolved with additional views, most consistent with overlapping tissue. No suspicious mass, distortion, or microcalcifications are identified to suggest presence of malignancy. On physical exam, the LEFT breast is hard and firm. There is a peau de orange appearance of the skin. Targeted ultrasound was performed of the site of palpable concern in the LEFT breast. Encompassing the near entirety of the LEFT upper outer quadrant (documented at 2 o'clock 6 cm from the nipple), there is a heterogeneous hypoechoic mass. Exact measurements are challenging to ascertain given size. It is estimated to span at least 7.3 x 3.2 x  7.0 cm. There are multiple lobulations of this mass extending throughout the entirety of the LEFT upper outer quadrant. There is a distinct hypoechoic mass in the LEFT lower outer quadrant at 4 o'clock 3 cm from the nipple which appear sonographically distinct from the dominant mass. This measures 3.5 x 1.7 by 3.7 cm. This likely corresponds to the asymmetry in the lower outer quadrant noted mammographically. Diffuse skin thickening is noted. Subcutaneous edema is noted. Targeted ultrasound was performed of the LEFT axilla. There are several enlarged LEFT axillary lymph nodes noted (approximately 3). They demonstrate diffusely thickened cortices with preserved echogenic hila and measure up to 9 mm in thickness. Targeted ultrasound was performed of the RIGHT upper outer breast. No suspicious cystic or solid mass is seen at the site of asymmetry concern. IMPRESSION: 1. There is a highly suspicious mass at the site of palpable concern in the LEFT breast. This mass appears to encompass the near entirety of the LEFT upper outer quadrant and spans at least 7.3 cm. Recommend ultrasound-guided biopsy for definitive characterization. 2. There is an additional distinct 3.7  cm mass at 4 o'clock 3 cm from the nipple. This likely reflects a satellite lesion in the setting of extensive multicentric disease. Recommend ultrasound-guided biopsy for definitive characterization. 3. There are at least 3 enlarged LEFT axillary lymph nodes. Recommend ultrasound-guided biopsy of a lymph node amenable to biopsy for definitive characterization. 4. There is asymmetric LEFT breast skin thickening and nipple retraction. This could reflect underlying inflammatory breast cancer. Skin punch biopsy could be considered by treatment team if clinically indicated. 5. No mammographic evidence of malignancy in the RIGHT breast. RECOMMENDATION: LEFT breast ultrasound-guided biopsy x2 LEFT axillary ultrasound-guided biopsy x1 I have discussed the findings  and recommendations with the patient. The biopsy procedure was discussed with the patient and questions were answered. Patient expressed their understanding of the biopsy recommendation. Patient will be scheduled for biopsy at her earliest convenience by the schedulers. Ordering provider will be notified. If applicable, a reminder letter will be sent to the patient regarding the next appointment. BI-RADS CATEGORY  5: Highly suggestive of malignancy. Electronically Signed   By: Corean Salter M.D.   On: 10/23/2024 16:16   US  LIMITED ULTRASOUND INCLUDING AXILLA RIGHT BREAST Result Date: 10/23/2024 CLINICAL DATA:  LEFT breast palpable EXAM: DIGITAL DIAGNOSTIC BILATERAL MAMMOGRAM WITH TOMOSYNTHESIS AND CAD; ULTRASOUND LEFT BREAST LIMITED; ULTRASOUND RIGHT BREAST LIMITED TECHNIQUE: Bilateral digital diagnostic mammography and breast tomosynthesis was performed. The images were evaluated with computer-aided detection. ; Targeted ultrasound examination of the left breast was performed.; Targeted ultrasound examination of the right breast was performed COMPARISON:  Previous exam(s). ACR Breast Density Category d: The breasts are extremely dense, which lowers the sensitivity of mammography. FINDINGS: Spot compression tomosynthesis views were obtained of the site of palpable concern in the LEFT breast. There is a focal asymmetry with associated architectural distortion subjacent to the site of palpable concern. There is globally increased asymmetry within the LEFT breast, most predominant in the upper outer and lower outer quadrants. There is new LEFT-sided skin thickening and nipple retraction. A questioned asymmetry in the RIGHT breast resolved with additional views, most consistent with overlapping tissue. No suspicious mass, distortion, or microcalcifications are identified to suggest presence of malignancy. On physical exam, the LEFT breast is hard and firm. There is a peau de orange appearance of the skin.  Targeted ultrasound was performed of the site of palpable concern in the LEFT breast. Encompassing the near entirety of the LEFT upper outer quadrant (documented at 2 o'clock 6 cm from the nipple), there is a heterogeneous hypoechoic mass. Exact measurements are challenging to ascertain given size. It is estimated to span at least 7.3 x 3.2 x 7.0 cm. There are multiple lobulations of this mass extending throughout the entirety of the LEFT upper outer quadrant. There is a distinct hypoechoic mass in the LEFT lower outer quadrant at 4 o'clock 3 cm from the nipple which appear sonographically distinct from the dominant mass. This measures 3.5 x 1.7 by 3.7 cm. This likely corresponds to the asymmetry in the lower outer quadrant noted mammographically. Diffuse skin thickening is noted. Subcutaneous edema is noted. Targeted ultrasound was performed of the LEFT axilla. There are several enlarged LEFT axillary lymph nodes noted (approximately 3). They demonstrate diffusely thickened cortices with preserved echogenic hila and measure up to 9 mm in thickness. Targeted ultrasound was performed of the RIGHT upper outer breast. No suspicious cystic or solid mass is seen at the site of asymmetry concern. IMPRESSION: 1. There is a highly suspicious mass at the site of palpable  concern in the LEFT breast. This mass appears to encompass the near entirety of the LEFT upper outer quadrant and spans at least 7.3 cm. Recommend ultrasound-guided biopsy for definitive characterization. 2. There is an additional distinct 3.7 cm mass at 4 o'clock 3 cm from the nipple. This likely reflects a satellite lesion in the setting of extensive multicentric disease. Recommend ultrasound-guided biopsy for definitive characterization. 3. There are at least 3 enlarged LEFT axillary lymph nodes. Recommend ultrasound-guided biopsy of a lymph node amenable to biopsy for definitive characterization. 4. There is asymmetric LEFT breast skin thickening and  nipple retraction. This could reflect underlying inflammatory breast cancer. Skin punch biopsy could be considered by treatment team if clinically indicated. 5. No mammographic evidence of malignancy in the RIGHT breast. RECOMMENDATION: LEFT breast ultrasound-guided biopsy x2 LEFT axillary ultrasound-guided biopsy x1 I have discussed the findings and recommendations with the patient. The biopsy procedure was discussed with the patient and questions were answered. Patient expressed their understanding of the biopsy recommendation. Patient will be scheduled for biopsy at her earliest convenience by the schedulers. Ordering provider will be notified. If applicable, a reminder letter will be sent to the patient regarding the next appointment. BI-RADS CATEGORY  5: Highly suggestive of malignancy. Electronically Signed   By: Corean Salter M.D.   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  Will get a PET scan to assess for metastatic disease.  Although patient is unlikely a surgical candidate, have sent a referral to general surgery for further evaluation based on PET scan results.  Patient has expressed interest in treatment with chemotherapy if necessary stating she would like to live to 89.  Given the triple negative status of her malignancy aromatase inhibitor would not be of any help.  We also discussed that XRT treatment would only be beneficial postoperatively.  Return to clinic in 1 to 2 weeks for further evaluation and continued treatment planning.  I spent a total of 60 minutes reviewing chart data, face-to-face evaluation with the patient, counseling and coordination of care as detailed above.  Patient expressed understanding and was in agreement with this plan. She also understands that She can call clinic at any time with  any questions, concerns, or complaints.    Cancer Staging  Invasive ductal carcinoma of left breast (HCC) Staging form: Breast, AJCC 8th Edition - Clinical: Stage IIIC (cT4b, cN1, cM0, G3, ER-, PR-, HER2-) - Signed by Jacobo Evalene PARAS, MD on 11/01/2024 Histologic grading system: 3 grade system   Evalene PARAS Jacobo, MD   11/01/2024 1:21 PM         [1]  Social History Tobacco Use   Smoking status: Never   Smokeless tobacco: Never  Vaping Use   Vaping status: Never Used  Substance Use Topics   Alcohol  use: No   Drug use: No  [2] No Known Allergies  "

## 2024-11-02 LAB — CANCER ANTIGEN 27.29: CA 27.29: 27.7 U/mL (ref 0.0–38.6)

## 2024-11-07 ENCOUNTER — Telehealth: Payer: Self-pay | Admitting: Surgery

## 2024-11-07 ENCOUNTER — Ambulatory Visit: Admitting: Surgery

## 2024-11-07 ENCOUNTER — Encounter: Payer: Self-pay | Admitting: Surgery

## 2024-11-07 VITALS — BP 155/85 | HR 74 | Ht 66.0 in | Wt 159.0 lb

## 2024-11-07 DIAGNOSIS — C50912 Malignant neoplasm of unspecified site of left female breast: Secondary | ICD-10-CM | POA: Diagnosis not present

## 2024-11-07 NOTE — Progress Notes (Signed)
 Patient ID: Susan [name], female   DOB: 1932/06/13, 89 y.o.   MRN: [id number]  HPI Susan [name] is a 89 y.o. female seen in consultation at the request of Dr. Jacobo.  Case discussed in detail with him. She recently reported a left-sided breast mass with some soreness and discomfort.  She reports some intermittent pain that is mild and dull.  Evidence of nipple discharge. She is very independent and lives at home.  She does not drive due to macular degeneration  She does have a history of AV block status post pacemaker Does have a normal ejection fraction of 65%.  She is still active and is able to walk, pay her bills are minus intact. Personally reviewed showing evidence of 8 cm left upper outer quadrant breast mass there is also an additional mass at the 4:00, there are 3 axillary lymph nodes. Biopsy shows invasive mammary carcinoma triple negative Patient reports that she wants to live hyponatremic 89 She is on pLavix   HPI  Past Medical History:  Diagnosis Date   AV block, Mobitz II    a. 03/2022 s/p [employer] Assurity MRI PM 2272 (ser# [id number]) DC PPM w/ L bundle area lead.   Colon polyp    Diastolic dysfunction    a. 03/2022 Echo: EF 60-65%, no rwma, GrI DD, nl RV fxn, mild-mod MR, mild AS.   Diffuse cystic mastopathy    Hyperlipidemia    Hypertension    Macular degeneration    Nipple discharge 03/22/2013   PAF (paroxysmal atrial fibrillation) (HCC)    a. 12/3022 noted on device interrogation-->[id number] = 8.   Stroke (Ocular)    Tachycardia    Type 2 diabetes mellitus (HCC)     Past Surgical History:  Procedure Laterality Date   ABDOMINAL HYSTERECTOMY     APPENDECTOMY     BREAST BIOPSY Bilateral 2006   benign, clip markers in both breasts   BREAST BIOPSY Left 10/25/2024   US  LT BREAST BX W LOC DEV 1ST LESION IMG BX SPEC US  GUIDE 10/25/2024 [facility]   BREAST BIOPSY Left 10/25/2024   US  LT BREAST BX W LOC DEV EA ADD LESION IMG BX SPEC US  GUIDE 10/25/2024  [facility]   COLONOSCOPY  05/03/2015   EYE SURGERY Bilateral 2014   cataract   LEFT HEART CATH AND CORONARY ANGIOGRAPHY N/A 12/23/2022   Procedure: LEFT HEART CATH AND CORONARY ANGIOGRAPHY;  Surgeon: Anner Alm ORN, MD;  Location: [facility] INVASIVE CV LAB;  Service: Cardiovascular;  Laterality: N/A;   PACEMAKER IMPLANT N/A 03/15/2022   Procedure: PACEMAKER IMPLANT;  Surgeon: Cindie Ole DASEN, MD;  Location: [facility] INVASIVE CV LAB;  Service: Cardiovascular;  Laterality: N/A;   salpingo oophorectmy      Family History  Problem Relation Age of Onset   Aortic aneurysm Mother        died from ruptured Ao.   Lung cancer Father    Kidney cancer Maternal Aunt    Breast cancer Neg Hx     Social History Social History[1]  Allergies[2]  Current Outpatient Medications  Medication Sig Dispense Refill   acetaminophen  (TYLENOL ) 325 MG tablet Take 1-2 tablets (325-650 mg total) by mouth every 4 (four) hours as needed for mild pain.     atorvastatin  (LIPITOR) 20 MG tablet Take 1 tablet (20 mg total) by mouth daily. 90 tablet 0   clopidogrel  (PLAVIX ) 75 MG tablet Take 1 tablet (75 mg total) by mouth daily. 90 tablet 3   Glycerin-Hypromellose-PEG 400 (VISINE DRY EYE  OP) Apply to eye as needed.     metFORMIN (GLUCOPHAGE) 500 MG tablet Take 2 tablets (1000 mg) by mouth in the morning and one tablet (500 mg) in the evening     metoprolol  tartrate (LOPRESSOR ) 50 MG tablet TAKE 1 TABLET (50 MG TOTAL) BY MOUTH IN THE MORNING, AT NOON, AND AT BEDTIME. 270 tablet 3   Multiple Vitamins-Minerals (PRESERVISION AREDS 2) CAPS Take 1 capsule by mouth in the morning and at bedtime. Lunch and late afternoon     sacubitril -valsartan  (ENTRESTO ) 24-26 MG Take 1 tablet by mouth 2 (two) times daily. 60 tablet 1   No current facility-administered medications for this visit.     Review of Systems Full ROS  was asked and was negative except for the information on the HPI  Physical Exam Blood pressure (!) 155/85,  pulse 74, height 5' 6 (1.676 m), weight 159 lb (72.1 kg), SpO2 99%. CONSTITUTIONAL: NAD. EYES: Pupils are equal, round, Sclera are non-icteric. EARS, NOSE, MOUTH AND THROAT: The oropharynx is clear. The oral mucosa is pink and moist. Hearing is intact to voice. LYMPH NODES:  Lymph nodes in the neck are normal. RESPIRATORY:  Lungs are clear. There is normal respiratory effort, with equal breath sounds bilaterally, and without pathologic use of accessory muscles. CARDIOVASCULAR: Heart is regular without murmurs, gallops, or rubs. Left chest wall pacemaker BREAST: There is a large left-sided breast mass encompassing reports of the total area of the left breast.  It is hard and there is some erythema in the skin and there is also an area at 2:00 near the axilla with an additional satellite lesion related to malignancy. No obvious axillary adenopathy either clinically relevant. Right breast without any lesions GI: The abdomen is  soft, nontender, and nondistended. There are no palpable masses. There is no hepatosplenomegaly. There are normal bowel sounds in all quadrants. GU: Rectal deferred.   MUSCULOSKELETAL: Normal muscle strength and tone. No cyanosis or edema.   SKIN: Turgor is good and there are no pathologic skin lesions or ulcers. NEUROLOGIC: Motor and sensation is grossly normal. Cranial nerves are grossly intact. PSYCH:  Oriented to person, place and time. Affect is normal.  Data Reviewed I have personally reviewed the patient's imaging, laboratory findings and medical records.    Assessment/Plan 89 yo functional pleasant lady with recently diagnosed w stage IIc triple negative left breast cancer.  She Does have a very large breast cancer and at this time I do recommend neoadjuvant therapy.  She is coping to any modality of therapy.  We talked about different modalities for treatment of breast cancer.  Surgical modalities.  At this time I do not think that she will be a candidate for  breast preservation surgery and I do think that the first order business is to start chemotherapy and try to shrink the tumor. Do consider her to stay to be a systemic disease.  We talk about placing a port.  Procedure discussed with her in detail.  There is the benefits of positive complications including but not limited to: Bleeding, infection, vascular injuries.  She understands and wishes to proceed.  She is also interested in potentially pursuing mastectomy after neoadjuvant therapy is completed I personally spent a total of 60 minutes in the care of the patient today including performing a medically appropriate exam/evaluation, counseling and educating, placing orders, referring and communicating with other health care professionals, documenting clinical information in the EHR, independently interpreting and reviewing images studies and coordinating care.  Laneta Luna, MD FACS General Surgeon 11/07/2024, 12:27 PM      [1]  Social History Tobacco Use   Smoking status: Never   Smokeless tobacco: Never  Vaping Use   Vaping status: Never Used  Substance Use Topics   Alcohol  use: No   Drug use: No  [2] No Known Allergies

## 2024-11-07 NOTE — Telephone Encounter (Signed)
 Patient has been advised of Pre-Admission date/time, and Surgery date at [facility].  Surgery Date: 11/13/24 Preadmission Testing Date: 11/09/24 (phone 1p-4p)  Patient informed of the scheduling process and surgery information given at time of office visit.  Patient has been made aware to call [phone], between 1-3:00pm the day before surgery, to find out what time to arrive for surgery.

## 2024-11-07 NOTE — Patient Instructions (Addendum)
 You will need to hold your Plavix  prior to surgery, do not take any more Plavix  until instructed to do so after surgery    We have seen you today and have spoken about your port placement. This will be scheduled at [facility] with Dr. Jordis.  If you are on any injectable weight loss medication, you will need to stop taking your GLP-1 injectable (weight loss) medications 8 days before your surgery to avoid any complications with anesthesia.   Please see the [facility]) Sheet provided for further details. Our surgery scheduler will call you to look at surgery dates and go over surgery information.   Please call our office with any questions or concerns that you have.   [facility]) A central line is a soft, flexible tube (catheter) that can be used to collect blood for testing or to give medicine or nutrition through a vein. The tip of the central line ends in a large vein just above the heart called the vena cava. A central line may be placed because: You need to get medicines or fluids through an IV tube for a long period of time. You need nutrition but cannot eat or absorb nutrients. The veins in your hands or arms are hard to access. You need to have blood taken often for blood tests. You need a blood transfusion You need chemotherapy or dialysis.  There are many types of central lines: Peripherally inserted central catheter (PICC) line. This type is used for intermediate access to long-term access of one week or more. It can be used to draw blood and give fluids or medicines. A PICC looks like an IV tube, but it goes up the arm to the heart. It is usually inserted in the upper arm and taped in place on the arm. Tunneled central line. This type is used for long-term therapy and dialysis. It is placed in a large vein in the neck, chest, or groin. A tunneled central line is inserted through a small incision made over the vein and is advanced into the heart. It is tunneled beneath  the skin and brought out through a second incision. Non-tunneled central line. This type is used for short-term access, usually of a maximum of 7 days. It is often used in the emergency department. A non-tunneled central line is inserted in the neck, chest, or groin. Implanted port. This type is used for long-term therapy. It can stay in place longer than other types of central lines. An implanted port is normally inserted in the upper chest but can also be placed in the upper arm or in the abdomen. It is inserted and removed with surgery, and it is accessed using a special needle.  The type of central line that you receive depends on how long you will need it, your medical condition, and the condition of your veins. What are the risks? Using any type of central line has risks that you should be aware of, including: Infection. A blood clot that blocks the central line or forms in the vein and travels to the heart. Bleeding from the place where the central line was put in. Developing a hole or crack within the central line. If this happens, the central line will need to be replaced. Developing an abnormal heart rhythm (arrhythmia). This is rare. Central line failure.  Follow these instructions at home: Flushing and cleaning the central line Follow instructions from the health care provider about flushing and cleaning the central line. Wear a mask  when flushing or cleaning the central line. Before you flush or clean the central line: Wash your hands with soap and water. Clean the central line hub with rubbing alcohol . Insertion site care Keep the insertion site of your central line clean and dry at all times. Check your incision or central line site every day for signs of infection. Check for: More redness, swelling, or pain. More fluid or blood. Warmth. Pus or a bad smell. General instructions Follow instructions from your health care provider for the type of device that you have. If the  central line accidentally gets pulled on, make sure: The bandage (dressing) is okay. There is no bleeding. The line has not been pulled out. Return to your normal activities as told by your health care provider. Ask your health care provider what activities are safe for you. You may be restricted from lifting or making repetitive arm movements on the side with the catheter. Do not swim or bathe unless your health care provider approves. Keep your dressing dry. Your health care provider can instruct you about how to keep your specific type of dressing from getting wet. Keep all follow-up visits as told by your health care provider. This is important. Contact a health care provider if: You have more redness, swelling, or pain around your incision. You have more fluid or blood coming from your incision. Your incision feels warm to the touch. You have pus or a bad smell coming from your incision. Get help right away if: You have: Chills. A fever. Shortness of breath. Trouble breathing. Chest pain. Swelling in your neck, face, chest, or arm on the side of your central line. You are coughing. You feel your heart beating rapidly or skipping beats. You feel dizzy or you faint. Your incision or central line site has red streaks spreading away from the area. Your incision or central line site is bleeding and does not stop. Your central line is difficult to flush or will not flush. You do not get a blood return from the central line. Your central line gets loose or comes out. Your central line gets damaged. Your catheter leaks when flushed or when fluids are infused into it. This information is not intended to replace advice given to you by your health care provider. Make sure you discuss any questions you have with your health care provider. Document Released: 11/11/2005 Document Revised: 05/19/2016 Document Reviewed: 04/28/2016 Elsevier Interactive Patient Education  2017 [employer].

## 2024-11-09 ENCOUNTER — Other Ambulatory Visit: Payer: Self-pay

## 2024-11-09 ENCOUNTER — Ambulatory Visit: Admission: RE | Admit: 2024-11-09 | Source: Ambulatory Visit

## 2024-11-09 ENCOUNTER — Ambulatory Visit: Admission: RE | Admit: 2024-11-09

## 2024-11-09 DIAGNOSIS — E1169 Type 2 diabetes mellitus with other specified complication: Secondary | ICD-10-CM

## 2024-11-09 DIAGNOSIS — C50912 Malignant neoplasm of unspecified site of left female breast: Secondary | ICD-10-CM

## 2024-11-09 HISTORY — DX: Long term (current) use of anticoagulants: Z79.01

## 2024-11-09 HISTORY — DX: Takotsubo syndrome: I51.81

## 2024-11-09 HISTORY — DX: Chronic diastolic (congestive) heart failure: I50.32

## 2024-11-09 HISTORY — DX: Unspecified atrial fibrillation: I48.91

## 2024-11-09 HISTORY — DX: Atrioventricular block, complete: I44.2

## 2024-11-09 HISTORY — DX: Presence of cardiac pacemaker: Z95.0

## 2024-11-09 HISTORY — DX: Malignant neoplasm of unspecified site of unspecified female breast: C50.919

## 2024-11-09 HISTORY — DX: Malignant neoplasm of unspecified site of left female breast: C50.912

## 2024-11-09 HISTORY — DX: Atherosclerotic heart disease of native coronary artery without angina pectoris: I25.10

## 2024-11-09 HISTORY — DX: Supraventricular tachycardia, unspecified: I47.10

## 2024-11-09 HISTORY — DX: Non-ST elevation (NSTEMI) myocardial infarction: I21.4

## 2024-11-09 LAB — GLUCOSE, CAPILLARY: Glucose-Capillary: 121 mg/dL — ABNORMAL HIGH (ref 70–99)

## 2024-11-09 MED ORDER — FLUDEOXYGLUCOSE F - 18 (FDG) INJECTION
9.0200 | Freq: Once | INTRAVENOUS | Status: AC | PRN
  Administered 2024-11-09: 9.02 via INTRAVENOUS

## 2024-11-09 NOTE — Progress Notes (Signed)
 Called pt and completed anesthesia interview. Pt has macular degeneration and has a hard time with her vision. I went over surgery instructions via phone but pt is not signed up for my chart. I asked pt if her daughter had an email address. Pt states that she does but she can't see it anymore to give it to me. I called pts daughter Dene Cave and got her email address and went over some pertinent instructions. Instructions faxed to [email or web address]

## 2024-11-09 NOTE — Patient Instructions (Addendum)
 Your procedure is scheduled on:11-13-24 Tuesday Report to the Registration Desk on the 1st floor of the [facility].Then proceed to the 2nd floor Surgery Desk To find out your arrival time, please call [phone] between 1PM - 3PM on:11-12-24 Monday If your arrival time is 6:00 am, do not arrive before that time as the [facility] entrance doors do not open until 6:00 am.  REMEMBER: Instructions that are not followed completely may result in serious medical risk, up to and including death; or upon the discretion of your surgeon and anesthesiologist your surgery may need to be rescheduled.  Do not eat food after midnight the night before surgery.  No gum chewing or hard candies.  You may however, drink Water up to 2 hours before you are scheduled to arrive for your surgery. Do not drink anything within 2 hours of your scheduled arrival time.  One week prior to surgery:Stop NOW (11-09-24) Stop Anti-inflammatories (NSAIDS) such as Advil, Aleve, Ibuprofen, Motrin, Naproxen, Naprosyn and Aspirin  based products such as Excedrin, Goody's Powder, BC Powder. Stop ANY OVER THE COUNTER supplements until after surgery (PRESERVISION AREDS 2)  You may however, continue to take Tylenol  if needed for pain up until the day of surgery.  Follow instructions given to you by Dr Jordis regarding your clopidogrel  (PLAVIX )-Last dose was on 11-07-24   Stop metFORMIN (GLUCOPHAGE) 2 days prior to surgery-Last dose will be on 11-10-24 Saturday  Stop apixaban  (ELIQUIS ) 2 days prior to surgery-Last dose will be on 11-10-24 Saturday  Continue taking all of your other prescription medications up until the day of surgery.  ON THE DAY OF SURGERY ONLY TAKE THESE MEDICATIONS WITH SIPS OF WATER: -metoprolol  tartrate (LOPRESSOR )   No Alcohol  for 24 hours before or after surgery.  No Smoking including e-cigarettes for 24 hours before surgery.  No chewable tobacco products for at least 6 hours before surgery.  No nicotine  patches on the day of surgery.  Do not use any recreational drugs for at least a week (preferably 2 weeks) before your surgery.  Please be advised that the combination of cocaine and anesthesia may have negative outcomes, up to and including death. If you test positive for cocaine, your surgery will be cancelled.  On the morning of surgery brush your teeth with toothpaste and water, you may rinse your mouth with mouthwash if you wish. Do not swallow any toothpaste or mouthwash.  Do not wear jewelry, make-up, hairpins, clips or nail polish.  For welded (permanent) jewelry: bracelets, anklets, waist bands, etc.  Please have this removed prior to surgery.  If it is not removed, there is a chance that hospital personnel will need to cut it off on the day of surgery.  Do not wear lotions, powders, or perfumes.   Do not shave body hair from the neck down 48 hours before surgery.  Contact lenses, hearing aids and dentures may not be worn into surgery.  Do not bring valuables to the hospital. [facility] is not responsible for any missing/lost belongings or valuables.   Notify your doctor if there is any change in your medical condition (cold, fever, infection).  Wear comfortable clothing (specific to your surgery type) to the hospital.  After surgery, you can help prevent lung complications by doing breathing exercises.  Take deep breaths and cough every 1-2 hours. Your doctor may order a device called an Incentive Spirometer to help you take deep breaths. When coughing or sneezing, hold a pillow firmly against your incision with  both hands. This is called splinting. Doing this helps protect your incision. It also decreases belly discomfort.  If you are being admitted to the hospital overnight, leave your suitcase in the car. After surgery it may be brought to your room.  In case of increased patient census, it may be necessary for you, the patient, to continue your postoperative care in  the Same Day Surgery department.  If you are being discharged the day of surgery, you will not be allowed to drive home. You will need a responsible individual to drive you home and stay with you for 24 hours after surgery.   If you are taking public transportation, you will need to have a responsible individual with you.  Please call the Pre-admissions Testing Dept. at [phone] if you have any questions about these instructions.  Surgery Visitation Policy:  Patients having surgery or a procedure may have two visitors.  Children under the age of 43 must have an adult with them who is not the patient.   [employer]ch[employer], Retail to address health-related social needs:  [email or web address]

## 2024-11-13 ENCOUNTER — Encounter: Admission: RE | Payer: Self-pay

## 2024-11-13 ENCOUNTER — Ambulatory Visit: Admission: RE | Admit: 2024-11-13 | Admitting: Surgery

## 2024-11-13 ENCOUNTER — Encounter: Payer: Self-pay | Admitting: Urgent Care

## 2024-11-14 ENCOUNTER — Inpatient Hospital Stay: Admitting: Oncology

## 2024-11-21 ENCOUNTER — Inpatient Hospital Stay

## 2024-12-10 ENCOUNTER — Other Ambulatory Visit

## 2024-12-11 ENCOUNTER — Ambulatory Visit

## 2024-12-17 ENCOUNTER — Ambulatory Visit

## 2025-03-12 ENCOUNTER — Ambulatory Visit

## 2025-06-11 ENCOUNTER — Ambulatory Visit

## 2025-09-10 ENCOUNTER — Ambulatory Visit

## 2025-12-10 ENCOUNTER — Ambulatory Visit
